# Patient Record
Sex: Female | Born: 1950 | Race: White | Hispanic: No | Smoking: Former smoker
Health system: Southern US, Community
[De-identification: ages and names within clinical notes are randomized; demographics above are authoritative.]

## PROBLEM LIST (undated history)

## (undated) ENCOUNTER — Encounter

## (undated) ENCOUNTER — Encounter: Attending: Anesthesiology | Primary: Anesthesiology

## (undated) ENCOUNTER — Ambulatory Visit: Payer: MEDICARE | Attending: Physician Assistant | Primary: Physician Assistant

## (undated) ENCOUNTER — Telehealth: Payer: MEDICARE | Attending: Physician Assistant | Primary: Physician Assistant

## (undated) ENCOUNTER — Encounter: Attending: Physical Medicine & Rehabilitation | Primary: Physical Medicine & Rehabilitation

## (undated) ENCOUNTER — Encounter: Attending: Family Medicine | Primary: Family Medicine

## (undated) ENCOUNTER — Telehealth: Attending: Mental Health | Primary: Mental Health

## (undated) ENCOUNTER — Ambulatory Visit: Payer: MEDICARE | Attending: Urology | Primary: Urology

## (undated) ENCOUNTER — Encounter: Attending: Physician Assistant | Primary: Physician Assistant

## (undated) ENCOUNTER — Encounter: Attending: Neurology | Primary: Neurology

## (undated) ENCOUNTER — Ambulatory Visit: Payer: MEDICARE

## (undated) ENCOUNTER — Ambulatory Visit
Payer: MEDICARE | Attending: Rehabilitative and Restorative Service Providers" | Primary: Rehabilitative and Restorative Service Providers"

## (undated) ENCOUNTER — Encounter: Attending: Orthopaedic Surgery | Primary: Orthopaedic Surgery

## (undated) ENCOUNTER — Inpatient Hospital Stay

## (undated) ENCOUNTER — Telehealth

## (undated) ENCOUNTER — Ambulatory Visit: Attending: Pharmacist | Primary: Pharmacist

## (undated) ENCOUNTER — Encounter: Attending: Internal Medicine | Primary: Internal Medicine

## (undated) ENCOUNTER — Encounter
Attending: Student in an Organized Health Care Education/Training Program | Primary: Student in an Organized Health Care Education/Training Program

## (undated) ENCOUNTER — Encounter: Payer: MEDICARE | Attending: Psychiatry | Primary: Psychiatry

## (undated) ENCOUNTER — Ambulatory Visit: Payer: MEDICARE | Attending: Anesthesiology | Primary: Anesthesiology

## (undated) ENCOUNTER — Telehealth: Attending: Neurology | Primary: Neurology

## (undated) ENCOUNTER — Encounter: Attending: Rheumatology | Primary: Rheumatology

## (undated) ENCOUNTER — Ambulatory Visit
Payer: MEDICARE | Attending: Student in an Organized Health Care Education/Training Program | Primary: Student in an Organized Health Care Education/Training Program

## (undated) ENCOUNTER — Telehealth
Attending: Pharmacist Clinician (PhC)/ Clinical Pharmacy Specialist | Primary: Pharmacist Clinician (PhC)/ Clinical Pharmacy Specialist

## (undated) ENCOUNTER — Encounter
Attending: Pharmacist Clinician (PhC)/ Clinical Pharmacy Specialist | Primary: Pharmacist Clinician (PhC)/ Clinical Pharmacy Specialist

## (undated) ENCOUNTER — Ambulatory Visit

## (undated) ENCOUNTER — Telehealth: Attending: Physician Assistant | Primary: Physician Assistant

## (undated) ENCOUNTER — Encounter: Attending: Family | Primary: Family

## (undated) ENCOUNTER — Telehealth: Payer: MEDICARE | Attending: Internal Medicine | Primary: Internal Medicine

## (undated) ENCOUNTER — Ambulatory Visit: Payer: MEDICARE | Attending: Neurology | Primary: Neurology

## (undated) ENCOUNTER — Encounter: Attending: Orthopaedic Surgery of the Spine | Primary: Orthopaedic Surgery of the Spine

## (undated) ENCOUNTER — Encounter: Attending: Psychiatry | Primary: Psychiatry

## (undated) ENCOUNTER — Encounter: Attending: Radiation Oncology | Primary: Radiation Oncology

## (undated) ENCOUNTER — Encounter: Attending: Urology | Primary: Urology

## (undated) ENCOUNTER — Ambulatory Visit: Payer: MEDICARE | Attending: Family Medicine | Primary: Family Medicine

## (undated) ENCOUNTER — Ambulatory Visit: Payer: MEDICARE | Attending: Physical Medicine & Rehabilitation | Primary: Physical Medicine & Rehabilitation

## (undated) ENCOUNTER — Ambulatory Visit: Payer: MEDICARE | Attending: Family | Primary: Family

## (undated) ENCOUNTER — Ambulatory Visit
Attending: Pharmacist Clinician (PhC)/ Clinical Pharmacy Specialist | Primary: Pharmacist Clinician (PhC)/ Clinical Pharmacy Specialist

## (undated) ENCOUNTER — Telehealth
Attending: Rehabilitative and Restorative Service Providers" | Primary: Rehabilitative and Restorative Service Providers"

## (undated) ENCOUNTER — Encounter: Attending: Mental Health | Primary: Mental Health

## (undated) ENCOUNTER — Telehealth: Attending: Nurse Practitioner | Primary: Nurse Practitioner

## (undated) ENCOUNTER — Telehealth: Attending: Physical Medicine & Rehabilitation | Primary: Physical Medicine & Rehabilitation

## (undated) ENCOUNTER — Ambulatory Visit: Payer: MEDICARE | Attending: Internal Medicine | Primary: Internal Medicine

## (undated) ENCOUNTER — Inpatient Hospital Stay: Payer: MEDICARE

## (undated) ENCOUNTER — Ambulatory Visit: Payer: MEDICARE | Attending: Otolaryngology | Primary: Otolaryngology

## (undated) ENCOUNTER — Ambulatory Visit: Payer: MEDICARE | Attending: Clinical | Primary: Clinical

## (undated) ENCOUNTER — Telehealth: Attending: Urology | Primary: Urology

## (undated) ENCOUNTER — Telehealth: Attending: Rheumatology | Primary: Rheumatology

## (undated) ENCOUNTER — Telehealth: Payer: MEDICARE | Attending: Family Medicine | Primary: Family Medicine

## (undated) ENCOUNTER — Encounter: Attending: Hematology & Oncology | Primary: Hematology & Oncology

## (undated) ENCOUNTER — Telehealth: Attending: Family Medicine | Primary: Family Medicine

## (undated) ENCOUNTER — Telehealth
Attending: Student in an Organized Health Care Education/Training Program | Primary: Student in an Organized Health Care Education/Training Program

## (undated) ENCOUNTER — Encounter: Attending: Pharmacotherapy | Primary: Pharmacotherapy

## (undated) ENCOUNTER — Ambulatory Visit: Payer: MEDICARE | Attending: Radiation Oncology | Primary: Radiation Oncology

## (undated) ENCOUNTER — Ambulatory Visit: Payer: MEDICARE | Attending: Pharmacotherapy | Primary: Pharmacotherapy

## (undated) ENCOUNTER — Ambulatory Visit: Payer: MEDICARE | Attending: Psychiatry | Primary: Psychiatry

## (undated) ENCOUNTER — Institutional Professional Consult (permissible substitution): Payer: MEDICARE | Attending: Physician Assistant | Primary: Physician Assistant

## (undated) ENCOUNTER — Encounter: Attending: Audiologist | Primary: Audiologist

## (undated) ENCOUNTER — Encounter: Attending: Child & Adolescent Psychiatry | Primary: Child & Adolescent Psychiatry

## (undated) ENCOUNTER — Ambulatory Visit: Attending: Radiation Oncology | Primary: Radiation Oncology

## (undated) ENCOUNTER — Encounter: Attending: Clinical | Primary: Clinical

## (undated) ENCOUNTER — Telehealth: Attending: Internal Medicine | Primary: Internal Medicine

## (undated) DIAGNOSIS — G35 Multiple sclerosis: Secondary | ICD-10-CM

## (undated) HISTORY — PX: OTHER SURGICAL HISTORY: SHX169

## (undated) HISTORY — PX: CERVICAL SPINE SURGERY: SHX589

## (undated) MED ORDER — ACETAMINOPHEN 500 MG TABLET: ORAL | 0.00000 days

---
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
## 2016-11-10 ENCOUNTER — Encounter: Payer: Self-pay | Admitting: Emergency Medicine

## 2016-11-10 ENCOUNTER — Emergency Department
Admission: EM | Admit: 2016-11-10 | Discharge: 2016-11-10 | Disposition: A | Discharge status: Home / Self Care | Payer: Medicare Other | Attending: Emergency Medicine | Admitting: Emergency Medicine

## 2016-11-10 DIAGNOSIS — Z0000 Encounter for general adult medical examination without abnormal findings: Secondary | ICD-10-CM | POA: Insufficient documentation

## 2016-11-10 DIAGNOSIS — F17200 Nicotine dependence, unspecified, uncomplicated: Secondary | ICD-10-CM | POA: Insufficient documentation

## 2016-11-10 DIAGNOSIS — E875 Hyperkalemia: Secondary | ICD-10-CM | POA: Diagnosis present

## 2016-11-10 HISTORY — DX: Multiple sclerosis: G35

## 2016-11-10 LAB — CBC WITH DIFFERENTIAL/PLATELET
BASOS ABS: 0 10*3/uL (ref 0–0.1)
BASOS PCT: 1 %
EOS PCT: 4 %
Eosinophils Absolute: 0.2 10*3/uL (ref 0–0.7)
HEMATOCRIT: 39.4 % (ref 35.0–47.0)
Hemoglobin: 13.3 g/dL (ref 12.0–16.0)
Lymphocytes Relative: 29 %
Lymphs Abs: 1.5 10*3/uL (ref 1.0–3.6)
MCH: 30.9 pg (ref 26.0–34.0)
MCHC: 33.6 g/dL (ref 32.0–36.0)
MCV: 92 fL (ref 80.0–100.0)
MONO ABS: 0.4 10*3/uL (ref 0.2–0.9)
MONOS PCT: 8 %
NEUTROS ABS: 3 10*3/uL (ref 1.4–6.5)
Neutrophils Relative %: 58 %
PLATELETS: 274 10*3/uL (ref 150–440)
RBC: 4.29 MIL/uL (ref 3.80–5.20)
RDW: 13.2 % (ref 11.5–14.5)
WBC: 5 10*3/uL (ref 3.6–11.0)

## 2016-11-10 LAB — BASIC METABOLIC PANEL
Anion gap: 5 (ref 5–15)
BUN: 20 mg/dL (ref 6–20)
CALCIUM: 8.6 mg/dL — AB (ref 8.9–10.3)
CO2: 29 mmol/L (ref 22–32)
Chloride: 102 mmol/L (ref 101–111)
Creatinine, Ser: 0.71 mg/dL (ref 0.44–1.00)
GFR calc Af Amer: >60 mL/min (ref >60)
GLUCOSE: 98 mg/dL (ref 65–99)
Potassium: 4.1 mmol/L (ref 3.5–5.1)
Sodium: 136 mmol/L (ref 135–145)

## 2016-11-10 NOTE — ED Provider Notes (Signed)
[facility] Emergency Department Provider Note ____________________________________________   I have reviewed the triage vital signs and the triage nursing note.  HISTORY  Chief Complaint Hyperkalemia    Historian Patient  HPI Haley Boyd is a 66 y.o. female was sent to ED for evaluation for elevated potassium upon outpatient neurology labs - reportedly 5.1 and 5.7.  She has MS.  Appt with neurologist 1 month ago showed 5.1.  Today was routine follow up and had bmp checked in follow up to prior lab value, she was called at home to go to ER for evaluation.  She is in the middle of a divorce and is moving and feels under stress.  She's felt exhausted and with MS flare of symptoms, but no chest pain or trouble breathing.    Past Medical History:  Diagnosis Date  . Multiple sclerosis (HCC)     There are no active problems to display for this patient.   Past Surgical History:  Procedure Laterality Date  . colon bypass      Prior to Admission medications   Not on File    Allergies  Allergen Reactions  . Codeine     No family history on file.  Social History Social History  Substance Use Topics  . Smoking status: Current Some Day Smoker  . Smokeless tobacco: Never Used  . Alcohol use Yes     Comment: occ    Review of Systems  Constitutional: Negative for fever. Eyes: Negative for visual changes. ENT: Negative for sore throat. Cardiovascular: Negative for chest pain. Respiratory: Negative for shortness of breath. Gastrointestinal: Negative for abdominal pain, vomiting and diarrhea. Genitourinary: Negative for dysuria. Musculoskeletal: Negative for back pain. Skin: Negative for rash. Neurological: Negative for headache. 10 point Review of Systems otherwise negative ____________________________________________   PHYSICAL EXAM:  VITAL SIGNS: ED Triage Vitals  Enc Vitals Group     BP 11/10/16 1643 110/69     Pulse Rate  11/10/16 1643 73     Resp 11/10/16 1643 16     Temp 11/10/16 1643 98.2 F (36.8 C)     Temp Source 11/10/16 1643 Oral     SpO2 11/10/16 1643 96 %     Weight 11/10/16 1639 142 lb (64.4 kg)     Height --      Head Circumference --      Peak Flow --      Pain Score --      Pain Loc --      Pain Edu? --      Excl. in GC? --      Constitutional: Alert and oriented. Well appearing and in no distress. HEENT   Head: Normocephalic and atraumatic.      Eyes: Conjunctivae are normal. PERRL. Normal extraocular movements.      Ears:         Nose: No congestion/rhinnorhea.   Mouth/Throat: Mucous membranes are moist.   Neck: No stridor. Cardiovascular/Chest: Normal rate, regular rhythm.  No murmurs, rubs, or gallops. Respiratory: Normal respiratory effort without tachypnea nor retractions. Breath sounds are clear and equal bilaterally. No wheezes/rales/rhonchi. Gastrointestinal: Soft. No distention, no guarding, no rebound. Nontender.    Genitourinary/rectal:Deferred Musculoskeletal: Nontender with normal range of motion in all extremities. No joint effusions.  No lower extremity tenderness.  No edema. Neurologic:  Normal speech and language. No gross or focal neurologic deficits are appreciated. Skin:  Skin is warm, dry and intact. No rash noted. Psychiatric: Mood and affect are normal.  Speech and behavior are normal. Patient exhibits appropriate insight and judgment.   ____________________________________________  LABS (pertinent positives/negatives)  Labs Reviewed  BASIC METABOLIC PANEL - Abnormal; Notable for the following:       Result Value   Calcium 8.6 (*)    All other components within normal limits  CBC WITH DIFFERENTIAL/PLATELET    ____________________________________________    EKG I, Governor Rooks, MD, the attending physician have personally viewed and interpreted all ECGs.  71 bpm. Normal sinus rhythm. Narrow QRS. Normal axis. Normal ST and T-wave. No  peaked T waves. ____________________________________________  RADIOLOGY All Xrays were viewed by me. Imaging interpreted by Radiologist.  None __________________________________________  PROCEDURES  Procedure(s) performed: None  Critical Care performed: None  ____________________________________________   ED COURSE / ASSESSMENT AND PLAN  Pertinent labs & imaging results that were available during my care of the patient were reviewed by me and considered in my medical decision making (see chart for details).    No clinical complaints.  BMP shows K 4.1 - discussed with patient.  Ok for discharge from ER.    CONSULTATIONS:  None  Patient / Family / Caregiver informed of clinical course, medical decision-making process, and agree with plan.   I discussed return precautions, follow-up instructions, and discharge instructions with patient and/or family.  Discharge instructions:  You were evaluated for outpatient labs concerning for elevated potassium, and your potassium level today is 4.1 (within the normal range).  Return to the emergency department for any chest pain, trouble breathing, dizziness, nausea, vomiting, numbness, numbness, or any other symptoms concerning to you. ___________________________________________   FINAL CLINICAL IMPRESSION(S) / ED DIAGNOSES   Final diagnoses:  Normal physical examination              Note: This dictation was prepared with Dragon dictation. Any transcriptional errors that result from this process are unintentional    Governor Rooks, MD 11/10/16 1750

## 2016-11-10 NOTE — Discharge Instructions (Signed)
You were evaluated for outpatient labs concerning for elevated potassium, and your potassium level today is 4.1 (within the normal range).  Return to the emergency department for any chest pain, trouble breathing, dizziness, nausea, vomiting, numbness, numbness, or any other symptoms concerning to you.

## 2016-11-10 NOTE — ED Triage Notes (Signed)
Pt states that she was seen by her neurologist today, was told that last month her potassium was 5.1, neurologist rechecked her potassium today and told her it was 5.7. Pt does not have a copy of labs. Unable to see labs in our system. Pt states that her neurologist is at [facility].  Pt. States that she feels more tired than normal, other than that pt denies any complaints at this time.

## 2017-02-27 ENCOUNTER — Ambulatory Visit
Admission: RE | Admit: 2017-02-27 | Discharge: 2017-02-27 | Disposition: A | Discharge status: Home / Self Care | Payer: MEDICARE

## 2017-02-27 DIAGNOSIS — M5481 Occipital neuralgia: Principal | ICD-10-CM

## 2017-02-27 MED FILL — COPAXONE/40MG/ML/SOSY: COPAXONE/40MG/ML/SOSY | 28 days supply | Qty: 12 | Fill #4

## 2017-03-02 MED ORDER — METHYLPREDNISOLONE 4 MG TABLETS IN A DOSE PACK
PACK | 0 refills | 0 days | Status: CP
Start: 2017-03-02 — End: 2017-03-21

## 2017-03-03 ENCOUNTER — Inpatient Hospital Stay
Admission: EM | Admit: 2017-03-03 | Discharge: 2017-03-08 | Disposition: A | Discharge status: Skilled Nursing Facility | Payer: MEDICARE | Source: Intra-hospital | Attending: Anesthesiology | Admitting: Anesthesiology

## 2017-03-03 ENCOUNTER — Inpatient Hospital Stay
Admission: EM | Admit: 2017-03-03 | Discharge: 2017-03-08 | Disposition: A | Discharge status: Skilled Nursing Facility | Payer: MEDICARE | Source: Intra-hospital

## 2017-03-03 DIAGNOSIS — M4712 Other spondylosis with myelopathy, cervical region: Principal | ICD-10-CM

## 2017-03-04 DIAGNOSIS — M4712 Other spondylosis with myelopathy, cervical region: Principal | ICD-10-CM

## 2017-03-04 MED ORDER — ONDANSETRON 4 MG DISINTEGRATING TABLET
ORAL_TABLET | Freq: Four times a day (QID) | ORAL | 0 refills | 0 days | Status: CP | PRN
Start: 2017-03-04 — End: 2017-03-11

## 2017-03-04 MED ORDER — OXYCODONE 5 MG TABLET
ORAL_TABLET | ORAL | 0 refills | 0.00000 days | Status: CP | PRN
Start: 2017-03-04 — End: 2017-03-11

## 2017-03-04 MED ORDER — DOCUSATE SODIUM 100 MG CAPSULE
ORAL_CAPSULE | Freq: Two times a day (BID) | ORAL | 0 refills | 0.00000 days | Status: CP
Start: 2017-03-04 — End: 2017-04-03

## 2017-03-04 MED ORDER — ACETAMINOPHEN 325 MG TABLET: 1000 mg | tablet | Freq: Three times a day (TID) | 0 refills | 0 days | Status: AC

## 2017-03-04 MED ORDER — ACETAMINOPHEN 325 MG TABLET
ORAL_TABLET | Freq: Three times a day (TID) | ORAL | 0 refills | 0.00000 days | Status: CP | PRN
Start: 2017-03-04 — End: 2017-04-03

## 2017-03-04 MED ORDER — DOCUSATE SODIUM 100 MG CAPSULE: 100 mg | capsule | Freq: Two times a day (BID) | 0 refills | 0 days | Status: AC

## 2017-03-04 MED ORDER — POLYETHYLENE GLYCOL 3350 17 GRAM ORAL POWDER PACKET
PACK | Freq: Every day | ORAL | 0 refills | 0.00000 days | Status: CP | PRN
Start: 2017-03-04 — End: 2017-03-07

## 2017-03-04 NOTE — Unmapped (Signed)
Discharge Summary    Admit date: 03/03/2017    Discharge date and time: 03/08/17     Discharge to:  Skilled nursing facility    Discharge Service: [facility])    Discharge Attending Physician: Timothy Lasso, MD    Discharge  Diagnoses: cervical myelopathy    Secondary Diagnosis: Active Problems:    * No active hospital problems. *      OR Procedures:    ARTHRODESIS, ANTERIOR INTERBODY TECHNIQUE, INCLUDE MINIMAL DISKECTOMY TO PREP INTERSPACE; CERVICAL BELOW C2  Date  03/04/2017  -------------------     Ancillary Procedures: no procedures    Discharge Day Services:    Subjective   No acute events since surgery. Reports mild pain about operative site, mild throat soreness. No new N/T.  Feels much stronger in UE/LE No chest pain. No SOB. No nausea/vomiting. Some dysphagia but able to swallow, no difficulty breathing.    Objective  No data found.    I/O this shift:  In: 360 [P.O.:360]  Out: 350 [Urine:350]    Physical Exam:  Physical Exam:  ??  General: NAD  CV: extremities WWP  Pulm: NWOB, SORA  Neuro: awake/alert  ??  Neck:  Dressing c/d/i without drainage.  Drain had scant SS drainage; pulled in AM  [place] J collar in place.  ??  Sensation: Intact to light touch in the bilat hands and bilat feet.   ??  Motor R L   Deltoid 5 5   Bicep 5- 5-   Tricep 5- ??  5-   WE 5 5   Grip 5 5   IO 5 5   IP 4 4   Quad 4 4   TA 5 5   EHL 5 5   GS 5 5       Hospital Course: Patient presented to the [facility] ED with 1 week of progressively worsening bilateral upper and lower extremity weakness. Patient was admitted to the orthopedics service for emergent C3-7 ACDF w/C5 corpectomy 7/15. The patient was taken to the OR on 03/04/2017. She tolerated the procedure well, was extubated in the OR, and was taken to the PACU where she received routine postoperative care before being transferred to the floor. She did well postoperatively. Her diet was slowly advanced and at the time of discharge she was tolerating the expected diet. The patient was able to void spontaneously, have her pain controlled with P.O. pain medication, and ambulate with minimal assistance prior to discharge.    Condition at Discharge: Improved  Discharge Medications:      Your Medication List      START taking these medications    acetaminophen 325 MG tablet  Commonly known as:  TYLENOL  Take 3 tablets (975 mg total) by mouth every eight (8) hours as needed.     docusate sodium 100 MG capsule  Commonly known as:  COLACE  Take 1 capsule (100 mg total) by mouth Two (2) times a day.     ondansetron 4 MG disintegrating tablet  Commonly known as:  ZOFRAN-ODT  Take 1 tablet (4 mg total) by mouth every six (6) hours as needed. for up to 7 days     oxyCODONE 5 MG immediate release tablet  Commonly known as:  ROXICODONE  Take 1-2 tablets (5-10 mg total) by mouth every four (4) hours as needed. for up to 7 days        CHANGE how you take these medications    baclofen 20 MG  tablet  Commonly known as:  LIORESAL  Take 1 tablet (20 mg total) by mouth Two (2) times a day. Morning and bedtime.  What changed:  ?? when to take this  ?? additional instructions        CONTINUE taking these medications    amLODIPine 2.5 MG tablet  Commonly known as:  NORVASC  Take 2.5 mg by mouth daily.     buPROPion 150 MG 12 hr tablet  Commonly known as:  WELLBUTRIN SR  Take 1 tablet (150 mg total) by mouth Two (2) times a day.     carBAMazepine 200 MG 12 hr capsule  Commonly known as:  CARBATROL  Take 2 capsules (400 mg total) by mouth Two (2) times a day.     cholecalciferol (vitamin D3) 2,000 unit Cap  Take 2,000 Units by mouth daily.     clonazePAM 1 MG tablet  Commonly known as:  KlonoPIN  Take 1.5 mg by mouth Three (3) times a day as needed for anxiety.     gabapentin 400 MG capsule  Commonly known as:  NEURONTIN  Take 400 mg by mouth Three (3) times a day.     glatiramer 40 mg/mL Syrg  Inject 1 mL (40 mg total) under the skin Three (3) times a week. Separate doses by at least 48 hours. methylPREDNISolone 4 mg tablet  Commonly known as:  MEDROL DOSEPACK  follow package directions     omeprazole 40 MG capsule  Commonly known as:  PriLOSEC  Take 40 mg by mouth daily.        ASK your doctor about these medications    polyethylene glycol 17 gram packet  Commonly known as:  MIRALAX  Take 17 g by mouth daily as needed (constipation). for up to 3 days  Ask about: Should I take this medication?            Pending Test Results:     Discharge Instructions:   Regular diet.       Follow Up instructions and Outpatient Referrals     Ambulatory referral to Home Health       Performing location?:  [facility]    Disciplines requested:   Physical Therapy  Occupational Therapy       Services to provide:   Strengthening Excercises  Evaluate and Treat  Assess and Teach       I certify that Ashlee Christian is confined to his/her home and needs physical therapy and occupational therapy. The patient is under my care, and I have authorized services on this plan of care and will periodically review the plan. The patient had a face-to-face encounter with an allowed provider type on (date) 03/08/17 and the encounter was related to the primary reason for home health care.         Discharge instructions       [facility] DEPARTMENT OF ORTHOPAEDICS - SPINE SURGERY    Dr. Frederik Pear                                DISCHARGE INSTRUCTIONS  Thank you for entrusting your care to [place].  We would like to make your healing process as rapid as possible.     Always CALL us first and we will call you back.    CONTACT NUMBER:  [phone] Marita Kansas, RN    NOTE: after 4 PM, or on weekends or holidays, please contact  the Hospital Operator at [phone] and ask for the Orthopedic Resident on Call. It may be up to 30 minutes before you receive a call back.    FOLLOW UP APPOINTMENT  Please call Central Scheduling Office: [phone] to make your 2 week post-op visit if it has not already been scheduled.     YOU SHOULD CALL IF:  Have more redness and/or swelling or excessive drainage at the incision site.  Increasing pain despite rest and taking pain medicine.  A Fever of 101.5?? F or greater, sweats, or chills 2 days after surgery  New pain in the arms or legs, New weakness or numbness.  Have difficulty with urination or bowel movements, pain or numbness in the rectal, vaginal or scrotal area.    WOUND CARE  You should leave the surgical dressing in place as long as it stays clean and dry. If dressing becomes wet and need to be changed, then place a dry dressing on the incision once or twice a day as needed only. The drainage from incision should be decreasing. If it is not decreasing, let us know. Do NOT put any creams on the incision until cleared by your physician.   Some drainage in the first few days is normal. If it is slightly reddish-brown/yellow, do not be concerned.  It is OKAY to shower with or without any dressing at incision site when you feel comfortable. Pat the incision site dry and replace with clean, dry dressing.     YOU SHOULD CALL 911 IF you have Shortness of Breath or Chest Pain.        ACTIVITY FOLLOWING SURGERY     Cervical Discectomy and Anterior Cervical Fusion  Perform Activity as tolerated  Keep wearing soft collar for comfort and as needed.    You should walk as much as tolerated  No heavy housework (bed making, vacuuming, laundry)  If use of stairs is necessary, go slowly and use handrail  No special exercises necessary-continue exercises shown in hospital  Avoid sitting in chairs and sofas that are low to the ground  Avoid sleeping on a mattress with the height not equal to that of your mid-thigh. This makes transfer in and out of bed easier.  Sexual Activity may be performed as tolerated.   Riding in the car is OK. Do not drive while taking Narcotic pain medications    For any and all questions regarding your after care, please try calling us FIRST and we will call you back.    Please note that narcotic pain medicine will not be refilled over the phone. We only refill pain medications up to 6 weeks postoperatively, and you will need an appointment for reevaluation prior to any refills.             Appointments which have been scheduled for you    Mar 09, 2017 10:15 AM EDT  INJECTION SET 30 with Karma Greaser, MD  [facility] SPINE CTR [facility] PAIN [facility] RD [place] [facility]) [address]  [place] [address]  [phone]   Mar 14, 2017  2:45 PM EDT  NEW GENERAL with Kem Kays, MD  [facility] UROLOGY [address] [facility] HILL [facility]) [address]  [place] [address]  [phone]   Mar 21, 2017 11:30 AM EDT  Danelle Earthly FOLLOW UP with Timothy Lasso, MD  [facility] SPINE CTR NEUROSURG ORTHO [facility] [address] [place] [facility] REGION) [address]  [place] [address]  [phone]   Apr 17, 2017 10:30 AM EDT  RETURN  NEURO-OPHTHAL with Harrietta Guardian, MD  [facility] O[address]THALMOLOGY [address] [place] [facility]) [address]  [address]  [place] [address]  [phone]   Apr 24, 2017 11:15 AM EDT  Danelle Earthly FOLLOW UP with Timothy Lasso, MD  [facility] SPINE CTR [address] [place] [facility] REGION) [address]  [place] [address]  [phone]   Apr 24, 2017  1:00 PM EDT  Ida Rogue with Wynona Canes ROOM 4  [facility] REGION) [address]  [place] [address]  [phone]   Jul 18, 2017 11:00 AM EST  [place] RETURN with Sarita Bottom, MD  [facility] NEUROLOGY CLINIC [address] [place] [address]  [facility]) [address]  [place] [address]  [phone]         Resources and Referrals     Shower chair       Type:  With Back    Length of Need:  99    Walker       Wheels:  5 Fixed    Length of Need:  99

## 2017-03-06 NOTE — Unmapped (Signed)
Physical Medicine and Rehab  Inpatient Consult Note [facility]    Requesting Attending Physician: Timothy Lasso, MD  Service Requesting Consult: Orthopedics [facility])    ASSESSMENT/RECOMMENDATIONS:     Ashlee Christian is a 66 y.o. female with PMHx as noted below seen in consultation for evaluation of rehab needs and recommendations regarding cervical stenosis/myelopathy now s/p ACDF w/ C5 corpectomy.    CONSTIPATION: Patient reports that she has not had a regular bowel movement in days. She said she had a small BM this a.m. but still feels constipated. She reportedly takes 400 mg of docusate at home daily for constipation. While she does report chronic constipation (q3days BM) at home, this could be due to her myelopathy as well.  Consider:  - Recommend increasing Docusate to 100mg  three times daily.   - Recommend starting Senna 2 tablets at noon (lunchtime) to utilize the gastro-colic reflex.  - Recommend low threshold for bisacodyl suppository (given as below)   - If able perform digital stimulation with administration of evening bisacodyl suppository (equivalent of Magic bullet patient uses at home). Performed 5-10 minutes after inserting the suppository:    1) With the patient lying on their left side, gently insert a lubricated finger into the rectum.    2) Gently move the finger in a counterclockwise circular motion keeping the finger in contact with the rectal wall.    3) Continue for 20-60 seconds and no longer than 1 minute.    4) Repeat every 5-10 minutes until a bowel movement is produced.    5) Completion of the bowel program is noted by no further stool after 2 digital stimulations or when mucus is excreted without stool.    ORTHOSTATIC HYPOTENSION:   Patient's with spinal cord injury are at higher risk for developing orthostatic hypotension given the inability to regulate sympathetic input. Orthostatic hypotension can occur due to: postural changes, head-up tilt >60 degrees, dehydration, poor nutritional status, anemia, large meals, caffeine, and alcohol.   The patient's blood pressure has reportedly been labile today with multiple episodes of hypotension. The patient began to feel woozy during our examination (she was sitting up) with BP in the low 90s/50s. We had the patient lay down to about 30?? and her symptoms improved and pressure increased to 112/60s. Multiple factors may be contributing as the patient may have orthostatic hypotension due to SCI as above as well as contributing factors of possible dehydration, Norvasc, and being on an increased dose of Neurontin as the patient reports that she was only on 400 mg twice a day prior to admission and she is now on 3200 mg daily.   - Orthostatic Hypotension Interventions that may be considered include:    - Wearing TEDs and/or abdominal binder when out of bed   - Encouraging smaller meals and limiting caffeine   - We recommend decreasing gabapentin as tolerated as the patient does not report any numbness/tingling or shooting pains at this time and this may be contributing to her hypotension.  - We recommend making sure that the patient states hydrated and encouraging by mouth intake/giving IVF as appropriate.   - We recommend holding Norvasc at this time    DISPO AND FURTHER REHAB:   - Patient has complex rehab, nursing, and medical needs given her recent blood pressure fluctuations. She may be appropriate for Acute Inpatient Rehabilitation depending on her further work with therapy. At this time it appears that she is likely too functional for AIR as the patient is  walking 100 feet with a rolling walker and standby assist, and per OT notes is supervision->independent with almost all ADL's.   - Please continue to have patient work with PT, OT, and SLP to maximize functional status with mobility and ADLs as well as for cognitive and swallow function.    [facility]  [facility]   [address] [place], [place] [address]   Phone: [phone]   Fax: [phone]    Please page Delane Ginger, DO from 8AM-5PM weekdays or the first call pager after hours/weekends/holidays with questions.    Thank you for this consult.    SUBJECTIVE:     Reason for Consult: Patient seen in consultation at the request of Timothy Lasso, MD for evaluation of rehabilitation needs and recommendations.    History of Present Illness: Ashlee Christian is a 66 y.o. female with PMHx as noted below seen in consultation regarding cervical stenosis/myelopathy (MRI with significant compression from C4-7) now s/p ACDF w/ C5 corpectomy. The patient has had a long history of cervical stenosis and is followed with Dr. Cameron Ali as an outpatient. The patient was planning on a cervical fusion in September but in early July, 2018 that is progressively worsening bilateral upper and lower extremity weakness. The patient was put on a Medrol Dosepak but failed to improve and went to [facility] ED. She is admitted and underwent emergent ACDF w/ C5 corpectomy on 03/04/17.    Prior to hospitalization the patient was living alone in a 1 level house with 4 stairs to enter. She had a son living near by and a daughter who is planning to live with her for 10 days after discharge to help her out.    Today the patient reports that she has had issues with her blood pressure. She states that when she had a line pulled this morning her pressure dropped to 40s over 20s. She has had IV fluids running since then and her pressure went up to 163/88. She states that she has gotten dizzy during these episodes of blood pressure fluctuation. She states that she has been getting stronger since her surgery and has been able to walk with a walker around the floor.    Pre-Morbid Functional Status: Current Functional Status:   Activities of daily living: independent  Mobility: The patient had a cane which she used for longer walks but walked independently around the house.   Assistive devices: The patient had a cane which she used for longer walks but walked independently around the house.     Activities of daily living:   Eating: independent  Bathing: set up & supervision in sitting, CGA if standing to wash perineal/buttocks  Recommend toileting with CGA  Toilet transfer with CGA  Mobility:   - Logroll with SBA  - sit to stand SBA, RW, 100' SBA RW.  Assistive devices: Rolling Walker        Precautions:  Safety Interventions  Safety Interventions: fall reduction program maintained, lighting adjusted for tasks/safety, nonskid shoes/slippers when out of bed, supervised activity   C-spine precautions    Medical / Surgical History:   Past Medical History:   Diagnosis Date   ??? Hypertension    ??? MS (multiple sclerosis) (CMS-HCC)    ??? Osteoporosis      Past Surgical History:   Procedure Laterality Date   ??? CATARACT EXTRACTION Bilateral 2016    [place]   ??? COLECTOMY     ???  EYE SURGERY Left 2012    Laser sx due to detached retina    ??? PR ALLOGRAFT FOR SPINE SURGERY ONLY MORSELIZED Midline 03/04/2017    Procedure: Allograft For Spine Surgery Only; Morselized;  Surgeon: Timothy Lasso, MD;  Location: MAIN OR [facility];  Service: Ortho Spine   ??? PR ANTERIOR INSTRUMENTATION 4-7 VERTEBRAL SEGMENTS Midline 03/04/2017    Procedure: Anterior Instrumentation; 4 To 7 Vertebral Segments;  Surgeon: Timothy Lasso, MD;  Location: MAIN OR [facility];  Service: Ortho Spine   ??? PR ARTHRODESIS ANT INTERBODY INC DISCECTOMY, CERVICAL BELOW C2 Midline 03/04/2017    Procedure: Arthrodes, Ant Intrbdy, Incl Disc Spc Prep, Discect, Osteophyt/Decompress Spinl Crd &/Or Nrv Rt, Crv Blo C2;  Surgeon: Timothy Lasso, MD;  Location: MAIN OR [facility];  Service: Ortho Spine   ??? PR ARTHRODESIS ANT INTERBODY MIN DISCECTOMY, CERVICAL BELOW C2  03/04/2017    Procedure: Arthrodesis, Anterior Interbody Technique, Include Minimal Diskectomy To Prep Interspace; Cervical Below C2;  Surgeon: Timothy Lasso, MD;  Location: MAIN OR [facility];  Service: Ortho Spine   ??? PR ARTHRODESIS ANT INTERBODY MIN DISCECTOMY,EA ADDL  03/04/2017    Procedure: Arthrodesis, Anterior Interbody Technique, W/Minimal Diskectomy To Prep Interspace; Each Add`L Interspace;  Surgeon: Timothy Lasso, MD;  Location: MAIN OR [facility];  Service: Ortho Spine   ??? PR AUTOGRAFT SPINE SURGERY LOCAL FROM SAME INCISION Midline 03/04/2017    Procedure: Autograft/Spine Surg Only (W/Harvest Graft); Local (Eg, Rib/Spinous Proc, Ples Specter) Obtain From Same Incis;  Surgeon: Timothy Lasso, MD;  Location: [facility]IN OR [facility];  Service: Ortho Spine   ??? PR INSJ BIOMCHN DEV INTERVERTEBRAL DSC SPC W/ARTHRD Midline 03/04/2017    Procedure: Insert Interbody Biomechanical Device(S) With Integral Anterior Instrument For Device Anchoring, When Performed, To Intervertebral Disc Space In Conjunction With Interbody Arthrodesis, Each Interspace;  Surgeon: Timothy Lasso, MD;  Location: MAIN OR [facility];  Service: Ortho Spine   ??? PR INSJ BIOMCHN DEV VRT CORPECTOMY DEFECT W/ARTHRD Midline 03/04/2017    Procedure: Insert Intervertebral Biomechanical Device(S) W Integral Anterior Instrument For Anchoring, When Performed, To Vert Corpectomy(Ies) Defect, In Conjunction W Interbody Arthrodesis, Each Contig Defect;  Surgeon: Timothy Lasso, MD;  Location: MAIN OR [facility];  Service: Ortho Spine   ??? PR REMV VERT BODY,CERV,ONE SGMT Midline 03/04/2017    Procedure: Vertebral Corpectomy-Ant W/Decomp; Cerv 1 Segmt;  Surgeon: Timothy Lasso, MD;  Location: MAIN OR [facility];  Service: Ortho Spine   ??? TONSILLECTOMY        Social History:   Social History   Substance Use Topics   ??? Smoking status: Never Smoker   ??? Smokeless tobacco: Never Used   ??? Alcohol use No     Home Environment: patient lives alone in a 1 level house, 4 steps at entry    Family History:   family history includes Hypertension in her mother and sister; No Known Problems in her brother, maternal aunt, maternal grandfather, maternal uncle, paternal aunt, paternal grandfather, paternal grandmother, and paternal uncle; Stroke in her father and maternal grandmother.    Allergies:   Amantadine and Codeine    Medications:   Scheduled   amLODIPine (NORVASC) tablet 2.5 mg Daily   baclofen (LIORESAL) tablet 20 mg TID   buPROPion (WELLBUTRIN SR) 12 hr tablet 150 mg BID   carBAMazepine (CARBATROL) 12 hr capsule 400 mg BID   cholecalciferol (vitamin D3) tablet 4,000 Units Daily   docusate sodium (COLACE) capsule 100 mg BID   gabapentin (NEURONTIN)  capsule 800 mg 4x Daily     PRN   acetaminophen 650 mg Q4H PRN   calcium carbonate 400 mg of elem calcium Daily PRN   clonazePAM 1.5 mg BID PRN   MORPhine injection 2 mg Q2H PRN   Or     MORPhine injection 4 mg Q2H PRN   omeprazole 40 mg Daily PRN   ondansetron 4 mg Q6H PRN   oxyCODONE 5 mg Q4H PRN   Or     oxyCODONE 10 mg Q4H PRN   polyethylene glycol 17 g Daily PRN     Continuous Infusions   sodium chloride Last Rate: 75 mL/hr (03/03/17 2244)       Review of Systems:    Full 10 systems reviewed and negative, other than as noted in the HPI.    OBJECTIVE:     Vitals:  Temp:  [35.8 ??C-36.2 ??C] 36.2 ??C  Heart Rate:  [72-76] 72  Resp:  [16-19] 16  BP: (124-148)/(76-81) 142/80  MAP (mmHg):  [89-96] 89  SpO2:  [94 %-96 %] 95 %    Physical Exam:  GEN: NAD sitting in bed  EYES: EOMI  HENT: Hearing adequate for conversation  NECK: supple  RESP: NWOB   CV: warm extremities  GI: non-distended   SKIN: no visible masses, lesions, rashes, ecchymoses, or lacerations  MSK: Moving b/l UEs at least antigravity. 2/5 HF bilaterally. Moves LEs at least antigravity otherwise  NEURO: A&Ox3,speech fluid and coherent, follows commands well and answers questions appropriately    LATER IN EXAM:  Soon after we started the exam the patient stated that she was feeling woozy. Vitals were checked and were 90s over 50s, pulse in the 70s (not responding to hypotension), pulse ox 92%. We laid the patient down and she started to feel a little bit better. Her pressure came back to the 112 over 60s and she felt a little better after this. The patient did not feel up to full manual muscle testing. The patient's nurse was made aware of the incident.    Labs and Diagnostic Studies: Reviewed    CBC -   Recent Labs  Lab Units 03/04/17  0343 03/03/17  1205   WBC 10*9/L 6.8 9.4   RBC 10*12/L 4.14 4.32   HEMOGLOBIN g/dL 60.4 54.0   HEMATOCRIT % 38.6 40.8   MCV fL 93.2 94.4   MCH pg 31.6 31.9   MCHC g/dL 98.1 19.1   RDW % 47.8 13.1   PLATELET COUNT (1) 10*9/L 336 294   MPV fL 6.3* 5.6*     BMP -   Recent Labs  Lab Units 03/04/17  1241 03/04/17  0343 03/03/17  1205   SODIUM WHOLE BLOOD mmol/L 130*  --   --    SODIUM mmol/L  --  131* 134*   POTASSIUM WHOLE BLOOD mmol/L 3.9  --   --    POTASSIUM mmol/L  --  4.4 5.5*   CHLORIDE mmol/L  --  98 97*   CO2 mmol/L  --  24.0 26.0   BUN mg/dL  --  14 10   CREATININE mg/dL  --  2.95* 6.21*   GLUCOSE mg/dL  --  308 657*       Radiology Results: Personally reviewed imaging. Radiology impression below.    03/03/17 MRI C-Spine:  - Moderate cervical spondylosis, mildly progressed from prior study; greatest at C4-C5 and C5-C6 where again moderate spinal canal stenosis or severe neuroforaminal stenosis is seen.    Reuel Boom  Nechama Guard, DO  [facility]    I saw and evaluated the patient, participating in the key portions of the service.  I reviewed the resident???s note.  I agree with the resident???s findings and plan.     Tobie Lords, MD, MPT  Assistant Professor  Physical Medicine and Rehabilitation  03/06/2017  3:40 PM

## 2017-03-06 NOTE — Unmapped (Signed)
Orthopaedic Surgery Post-Op Note  Primary Team: Orthopedics ([facility])    Procedure: Midline - Vertebral Corpectomy-Ant W/Decomp; Cerv 1 Segmt  Midline - Arthrodes, Ant Intrbdy, Incl Disc Spc Prep, Discect, Osteophyt/Decompress Spinl Crd &/Or Nrv Rt, Crv Blo C2  Midline - Anterior Instrumentation; 4 To 7 Vertebral Segments  Midline - Insert Interbody Biomechanical Device(S) With Integral Anterior Instrument For Device Anchoring, When Performed, To Intervertebral Disc Space In Conjunction With Interbody Arthrodesis, Each Interspace  Midline - Insert Intervertebral Biomechanical Device(S) W Integral Anterior Instrument For Anchoring, When Performed, To Vert Corpectomy(Ies) Defect, In Conjunction W Interbody Arthrodesis, Each Contig Defect  Midline - Allograft For Spine Surgery Only; Morselized  Midline - Autograft/Spine Surg Only (W/Harvest Graft); Local (Eg, Rib/Spinous Proc, Ples Specter) Obtain From Same Incis  Arthrodesis, Anterior Interbody Technique, Include Minimal Diskectomy To Prep Interspace; Cervical Below C2  Arthrodesis, Anterior Interbody Technique, W/Minimal Diskectomy To Prep Interspace; Each Add`L Interspace  Date: 03/04/2017    Assessment/Plan:  66 y.o. female with h/o MS and cervical myelopathy w/ worsening UE weakness s/p emergent C3-7 ACDF w/ C5 corpectomy 7/15, doing well postoperatively.    - Pain control via multimodal regimen, encourage prn with gradual transition to po  - Antibiotics: ancef x23h postoperatively  - Decadron 10 Q6H x24h post-op  - Weightbearing status: WBAT, activity as tolerated  - Dressing: Keep clean, dry, and intact.   - Drain pulled 7/17; <10cc of serosanginous output; dressing changed to 4x4 with medipore tape  - HOB elevated POD1  - PT/OT.  - Upright XRs after mobilizing with PT  - Diet: Regular  - Labs: none  - Foley: no Foley  - VTE prophylaxis plan/recommendation: Mechanical/SCDs    - Plan for day: pain control and PT mobilization  - EDD: 7/17-18  - Discharge plan: Home  - Barriers to discharge: pain control, clearance by PT/OT  - Follow-up: 10-14d post-operatively    -----------------------------------------------------------------------------------------------  - Current orthopaedic contact resident: [place]  - Current orthopaedic care attending: Dr. Cameron Ali  - On nights (6pm-6am), weekends, and holidays, please page Orthopaedic Consult pager.    Subjective:  No acute events since surgery. Reports mild pain about operative site, mild throat soreness. No new N/T.  Feels much stronger in UE/LE No chest pain. No SOB. No nausea/vomiting. Some dysphagia but able to swallow, no difficulty breathing.    Objective:   BP 124/80  - Pulse 76  - Temp 36.2 ??C (Oral)  - Resp 16  - Ht 167.6 cm (5' 6)  - Wt 65.3 kg (144 lb)  - SpO2 94%  - BMI 23.24 kg/m??     Physical Exam:    General: NAD  CV: extremities WWP  Pulm: NWOB, SORA  Neuro: awake/alert    Neck:  Dressing c/d/i without drainage.  Drain had scant SS drainage; pulled in AM  [place] J collar in place.    Sensation: Intact to light touch in the bilat hands and bilat feet.   ??  Motor R L   Deltoid 5 5   Bicep 4+ 4+   Tricep 4+ 4+   WE 5 5   Grip 5 5   IO 5 5   IP 4 4   Quad 4 4   TA 4+ 4+   EHL 4+ 4+   GS 5 5       Laboratory Data:   Lab Results   Component Value Date    WBC 6.8 03/04/2017    RBC 4.14 03/04/2017  HGB 13.1 03/04/2017    HCT 38.6 03/04/2017    MCV 93.2 03/04/2017    MCH 31.6 03/04/2017    MCHC 33.9 03/04/2017    RDW 12.7 03/04/2017    PLT 336 03/04/2017       Lab Results   Component Value Date    NA 130 (L) 03/04/2017    K 3.9 03/04/2017    CL 98 03/04/2017    CO2 24.0 03/04/2017    BUN 14 03/04/2017    CREATININE 0.55 (L) 03/04/2017    GLU 115 03/04/2017    CALCIUM 9.1 03/04/2017    ALBUMIN 4.3 10/13/2016       Imaging:  None

## 2017-03-06 NOTE — Unmapped (Signed)
PHYSICAL THERAPY  Evaluation (03/06/17 0929)     Patient Name:  Ashlee Christian       Medical Record Number: [id number]   Date of Birth: 1951/05/21  Sex: Female            Treatment Diagnosis: s/p emergent C3-7 ACDF w/C5 corpectomy    ASSESSMENT    Ashlee Christian is a 66 yo F presenting to PT s/p above procedures. Tolerated ambulation in the hallway with RW today, too fatigued to do the stairs. Anticipate quick progress with 1-2 more visits. Recs are 3x. Moderate complexity eval today 2/2 PMH, acute hospital problems and social factors.    Today's Interventions: AMPAC 18/24, reviewed role of PT, POC, post op precautions, transfer techniques, cues during transfers for safe hand placement and positioning    Activity Tolerance: Patient limited by fatigue;Patient tolerated treatment well (Pt comfortable and in NAD following PT, left sitting up in bedside chair with needs within reach.)    PLAN  Planned Frequency of Treatment:  1-2x per day for: 4-5x week      Planned Interventions: Balance activities;Diaphragmatic / Pursed-lip breathing;Education - Patient;Education - Family / caregiver;Endurance activities;Functional mobility;Gait training;Home exercise program;Neuromuscular re-education;Postural re-education;Self-care / Home training;Stair training;Therapeutic exercise;Therapeutic activity;Transfer training    Post-Discharge Physical Therapy Recommendations:  3x weekly        PT DME Recommendations: Walker (rolling)     Prognosis:  Good  Barriers to Discharge: Endurance deficits  Positive Indicators: PLOF    SUBJECTIVE  Patient reports: RN cleared pt for PT, pt agreeable to PT.  Current Functional Status: Pt received reclined in bed, alert and in NAD.     Prior functional status: Mod I with SPC, has had falls in the past.  Equipment available at home: [place] Mor    Past Medical History:   Diagnosis Date   ??? Hypertension    ??? MS (multiple sclerosis) (CMS-HCC)    ??? Osteoporosis     Social History   Substance Use Topics ??? Smoking status: Never Smoker   ??? Smokeless tobacco: Never Used   ??? Alcohol use No      Past Surgical History:   Procedure Laterality Date   ??? CATARACT EXTRACTION Bilateral 2016    [place]   ??? COLECTOMY     ??? EYE SURGERY Left 2012    Laser sx due to detached retina    ??? PR ALLOGRAFT FOR SPINE SURGERY ONLY MORSELIZED Midline 03/04/2017    Procedure: Allograft For Spine Surgery Only; Morselized;  Surgeon: Timothy Lasso, MD;  Location: MAIN OR [facility];  Service: Ortho Spine   ??? PR ANTERIOR INSTRUMENTATION 4-7 VERTEBRAL SEGMENTS Midline 03/04/2017    Procedure: Anterior Instrumentation; 4 To 7 Vertebral Segments;  Surgeon: Timothy Lasso, MD;  Location: MAIN OR [facility];  Service: Ortho Spine   ??? PR ARTHRODESIS ANT INTERBODY INC DISCECTOMY, CERVICAL BELOW C2 Midline 03/04/2017    Procedure: Arthrodes, Ant Intrbdy, Incl Disc Spc Prep, Discect, Osteophyt/Decompress Spinl Crd &/Or Nrv Rt, Crv Blo C2;  Surgeon: Timothy Lasso, MD;  Location: MAIN OR [facility];  Service: Ortho Spine   ??? PR ARTHRODESIS ANT INTERBODY MIN DISCECTOMY, CERVICAL BELOW C2  03/04/2017    Procedure: Arthrodesis, Anterior Interbody Technique, Include Minimal Diskectomy To Prep Interspace; Cervical Below C2;  Surgeon: Timothy Lasso, MD;  Location: MAIN OR [facility];  Service: Ortho Spine   ??? PR ARTHRODESIS ANT INTERBODY MIN DISCECTOMY,EA ADDL  03/04/2017    Procedure: Arthrodesis, Anterior Interbody Technique, Theodis Blaze Diskectomy  To Prep Interspace; Each Add`L Interspace;  Surgeon: Timothy Lasso, MD;  Location: MAIN OR [facility];  Service: Ortho Spine   ??? PR AUTOGRAFT SPINE SURGERY LOCAL FROM SAME INCISION Midline 03/04/2017    Procedure: Autograft/Spine Surg Only (W/Harvest Graft); Local (Eg, Rib/Spinous Proc, Ples Specter) Obtain From Same Incis;  Surgeon: Timothy Lasso, MD;  Location: MAIN OR [facility];  Service: Ortho Spine   ??? PR INSJ BIOMCHN DEV INTERVERTEBRAL DSC SPC W/ARTHRD Midline 03/04/2017    Procedure: Insert Interbody Biomechanical Device(S) With Integral Anterior Instrument For Device Anchoring, When Performed, To Intervertebral Disc Space In Conjunction With Interbody Arthrodesis, Each Interspace;  Surgeon: Timothy Lasso, MD;  Location: MAIN OR [facility];  Service: Ortho Spine   ??? PR INSJ BIOMCHN DEV VRT CORPECTOMY DEFECT W/ARTHRD Midline 03/04/2017    Procedure: Insert Intervertebral Biomechanical Device(S) W Integral Anterior Instrument For Anchoring, When Performed, To Vert Corpectomy(Ies) Defect, In Conjunction W Interbody Arthrodesis, Each Contig Defect;  Surgeon: Timothy Lasso, MD;  Location: MAIN OR [facility];  Service: Ortho Spine   ??? PR REMV VERT BODY,CERV,ONE SGMT Midline 03/04/2017    Procedure: Vertebral Corpectomy-Ant W/Decomp; Cerv 1 Segmt;  Surgeon: Timothy Lasso, MD;  Location: MAIN OR [facility];  Service: Ortho Spine   ??? TONSILLECTOMY      Family History   Problem Relation Age of Onset   ??? Hypertension Mother    ??? Stroke Father    ??? Hypertension Sister    ??? No Known Problems Brother    ??? No Known Problems Maternal Aunt    ??? No Known Problems Maternal Uncle    ??? No Known Problems Paternal Aunt    ??? No Known Problems Paternal Uncle    ??? Stroke Maternal Grandmother    ??? No Known Problems Maternal Grandfather    ??? No Known Problems Paternal Grandmother    ??? No Known Problems Paternal Grandfather    ??? Amblyopia Neg Hx    ??? Blindness Neg Hx    ??? Cancer Neg Hx    ??? Cataracts Neg Hx    ??? Diabetes Neg Hx    ??? Glaucoma Neg Hx    ??? Macular degeneration Neg Hx    ??? Retinal detachment Neg Hx    ??? Strabismus Neg Hx    ??? Thyroid disease Neg Hx         Allergies: Amantadine and Codeine                Objective Findings              Precautions: Falls (post op c-spine)              Weight Bearing Status: Non- applicable              Required Braces or Orthoses: [place] J    Communication Preference: Verbal  Pain Comments: Mild throat soreness, no other complaints  Medical Tests / Procedures: Imaging, labs reviewed. OR 7/15: 1. C5 corpectomy  2. Anterior cervical fusion of C4-5 and C5-6  3. Anterior cervical decompression and fusion of C6-7   4. Anterior cervical instrumentation from C4-C7   5. Application of interbody cage to corpectomy defect at C5  6. Application of interbody cage to disc space at C6-7   7. Application of allograft bone  8. Application of autograft bone     Equipment / Environment: Vascular access (PIV, TLC, Port-a-cath, PICC)  Living environment: House  Lives With: Alone (son lives nearby)  Home Living: One level home;Stairs to enter with rails        Number of Stairs: 4    Cognition: AO x 4     Skin Inspection: visible skin intact       UE Strength: WFL within precautions     LE Strength: WFL within precautions                                    Bed Mobility: logroll supine to sit with SBA, from elevated HOB  Transfers: sit to/from standing with SBA, RW   Gait: 1 x 100' SBA, RW   Stairs: Pt too fatigued to try the stairs today           Eval Duration(PT): 25 Min.    Medical Staff Made Aware: RN aware  PT G-Codes  Functional Assessment Tool Used: AMPAC  Score: 18/24  Functional Limitation: Mobility: Walking and moving around  Mobility: Walking and Moving Around Current Status (Z6109): At least 40 percent but less than 60 percent impaired, limited or restricted  Mobility: Walking and Moving Around Goal Status [phone]): At least 1 percent but less than 20 percent impaired, limited or restricted  I attest that I have reviewed the above information.  Signed: Starr Lake, PT  Filed 03/06/2017

## 2017-03-06 NOTE — Unmapped (Signed)
OCCUPATIONAL THERAPY  Evaluation (03/06/17 1101)    Patient Name:  Ashlee Christian       Medical Record Number: [id number]   Date of Birth: [birth year]  Sex: Female          OT Treatment Diagnosis:  difficulty with ADLs & functional mobility/transfers s/p emergent C3-7 ACDF w/ C5 corpectomy 7/15    Assessment  Pt is a 66 y.o. female with h/o MS and cervical myelopathy w/ worsening UE weakness s/p emergent C3-7 ACDF w/ C5 corpectomy 7/15, doing well postoperatively.  Pt presents with new post-op c-spine precautions, symptomatic hypotension, impaired balance and decreased activity tolerance impacting her independence & safety completing ADLs & functional mobility/transfers.  Pt will benefit from skilled OT to maximize her functional indepenence, safety and decrease her burden of care.  Based on the daily activity AM-PAC raw score of 19/24, the pt is considered to be 42.80% impaired with self care. This indicates pt is appropriate for 3x weekly at d/c IF pt's daughter able to stay with her at d/c otherwise recommend 5x weekly at d/c as pt lives alone and currently requires CGA-min assist for dynamic standing balance during ADLs & functional mobility/transfers.  Review of pt's occupational profile, client history, assessment of occupational performance, clinical decision making and development of POC required moderate complexity OT evaluation.       Activity Tolerance During Today's Session  Patient tolerated treatment well    Plan  Planned Frequency of Treatment:  1x per day for: 4-5x week    Planned Interventions:  Adaptive equipment;ADL retraining;Balance activities;Bed mobility;Compensatory tech. training;Conservation;Education - Patient;Education - Family / caregiver;Endurance activities;Functional mobility;Home exercise program;Postular / Proximal stability;Positioning;Safety education;Therapeutic exercise;Transfer training;UE Strength / coordination exercise    Post-Discharge Occupational Therapy Recommendations:  OT Post Acute Discharge Recommendations: 3x weekly (if daughter able to stay with pt)   OT DME Recommendations: Tub seat with back    GOALS:   Patient and Family Goals: to return home and PLOF    Long Term Goal #1: pt will return to PLOF in 8 weeks.     Short Term:  Pt will complete toilet transfer mod I using LRAD.   Time Frame : 1 week  Pt will complete toileting mod I using LRAD.   Time Frame : 1 week  Pt will complete LB dressing mod I using LRAD.   Time Frame : 1 week  Caregiver will doff/don [place] J collar independent within context of ADLs.   Time Frame : 1 week    Prognosis:  Excellent  Positive Indicators:  PLOF, supportive family  Barriers to Discharge: Inability to safely perform ADLS;Impaired Balance (hypotension)    Subjective  Current Status Pt received sitting up in recliner; left in supine, all needs within reach, RN Lorna notified  Prior Functional Status mod I-independent with ADLs, IADLs and functional mobility/transfers using cane    Medical Tests / Procedures: emergent C3-7 ACDF w/ C5 corpectomy 7/15  Patient / Caregiver reports: My daughter said she could take family medical leave and stay with me.    Past Medical History:   Diagnosis Date   ??? Hypertension    ??? MS (multiple sclerosis) (CMS-HCC)    ??? Osteoporosis     Social History   Substance Use Topics   ??? Smoking status: Never Smoker   ??? Smokeless tobacco: Never Used   ??? Alcohol use No      Past Surgical History:   Procedure Laterality Date   ??? CATARACT EXTRACTION Bilateral 2016  [place]   ??? COLECTOMY     ??? EYE SURGERY Left 2012    Laser sx due to detached retina    ??? PR ALLOGRAFT FOR SPINE SURGERY ONLY MORSELIZED Midline 03/04/2017    Procedure: Allograft For Spine Surgery Only; Morselized;  Surgeon: Timothy Lasso, MD;  Location: MAIN OR [facility];  Service: Ortho Spine   ??? PR ANTERIOR INSTRUMENTATION 4-7 VERTEBRAL SEGMENTS Midline 03/04/2017    Procedure: Anterior Instrumentation; 4 To 7 Vertebral Segments; Surgeon: Timothy Lasso, MD;  Location: MAIN OR [facility];  Service: Ortho Spine   ??? PR ARTHRODESIS ANT INTERBODY INC DISCECTOMY, CERVICAL BELOW C2 Midline 03/04/2017    Procedure: Arthrodes, Ant Intrbdy, Incl Disc Spc Prep, Discect, Osteophyt/Decompress Spinl Crd &/Or Nrv Rt, Crv Blo C2;  Surgeon: Timothy Lasso, MD;  Location: MAIN OR [facility];  Service: Ortho Spine   ??? PR ARTHRODESIS ANT INTERBODY MIN DISCECTOMY, CERVICAL BELOW C2  03/04/2017    Procedure: Arthrodesis, Anterior Interbody Technique, Include Minimal Diskectomy To Prep Interspace; Cervical Below C2;  Surgeon: Timothy Lasso, MD;  Location: MA[facility] OR [facility];  Service: Ortho Spine   ??? PR ARTHRODESIS ANT INTERBODY MIN DISCECTOMY,EA ADDL  03/04/2017    Procedure: Arthrodesis, Anterior Interbody Technique, W/Minimal Diskectomy To Prep Interspace; Each Add`L Interspace;  Surgeon: Timothy Lasso, MD;  Location: MAIN OR [facility];  Service: Ortho Spine   ??? PR AUTOGRAFT SPINE SURGERY LOCAL FROM SAME INCISION Midline 03/04/2017    Procedure: Autograft/Spine Surg Only (W/Harvest Graft); Local (Eg, Rib/Spinous Proc, Ples Specter) Obtain From Same Incis;  Surgeon: Timothy Lasso, MD;  Location: MAIN OR [facility];  Service: Ortho Spine   ??? PR INSJ BIOMCHN DEV INTERVERTEBRAL DSC SPC W/ARTHRD Midline 03/04/2017    Procedure: Insert Interbody Biomechanical Device(S) With Integral Anterior Instrument For Device Anchoring, When Performed, To Intervertebral Disc Space In Conjunction With Interbody Arthrodesis, Each Interspace;  Surgeon: Timothy Lasso, MD;  Location: MAIN OR [facility];  Service: Ortho Spine   ??? PR INSJ BIOMCHN DEV VRT CORPECTOMY DEFECT W/ARTHRD Midline 03/04/2017    Procedure: Insert Intervertebral Biomechanical Device(S) W Integral Anterior Instrument For Anchoring, When Performed, To Vert Corpectomy(Ies) Defect, In Conjunction W Interbody Arthrodesis, Each Contig Defect;  Surgeon: Timothy Lasso, MD;  Location: MAIN OR [facility];  Service: Ortho Spine   ??? PR REMV VERT BODY,CERV,ONE SGMT Midline 03/04/2017    Procedure: Vertebral Corpectomy-Ant W/Decomp; Cerv 1 Segmt;  Surgeon: Timothy Lasso, MD;  Location: MAIN OR [facility];  Service: Ortho Spine   ??? TONSILLECTOMY      Family History   Problem Relation Age of Onset   ??? Hypertension Mother    ??? Stroke Father    ??? Hypertension Sister    ??? No Known Problems Brother    ??? No Known Problems Maternal Aunt    ??? No Known Problems Maternal Uncle    ??? No Known Problems Paternal Aunt    ??? No Known Problems Paternal Uncle    ??? Stroke Maternal Grandmother    ??? No Known Problems Maternal Grandfather    ??? No Known Problems Paternal Grandmother    ??? No Known Problems Paternal Grandfather    ??? Amblyopia Neg Hx    ??? Blindness Neg Hx    ??? Cancer Neg Hx    ??? Cataracts Neg Hx    ??? Diabetes Neg Hx    ??? Glaucoma Neg Hx    ??? Macular degeneration Neg Hx    ??? Retinal detachment  Neg Hx    ??? Strabismus Neg Hx    ??? Thyroid disease Neg Hx         Amantadine and Codeine     Objective Findings  Precautions / Restrictions  Falls precautions (post-op c-spine precautions)    Weight Bearing  Non-applicable    Required Braces or Orthoses  [place] J;C-collar    Communication Preference  Verbal    Pain  no c/o pain    Equipment / Environment  Vascular access (PIV, TLC, Port-a-cath, PICC)    Living Situation   Living environment: House   Lives With: Alone (reports her son lives nearby and daughter (from [place]) may be able to take FMLA and stay with her at d/c)   Home Living: One level home;Stairs to enter with rails;Tub/shower unit;Standard height toilet   Equipment available at home: [employer] placement (outside): Rail on right side    Cognition   Orientation Level:  Oriented x 4   Arousal/Alertness:  Appropriate responses to stimuli   Attention Span:  Appears intact   Memory:  Appears intact   Following Commands:  Follows all commands and directions without difficulty   Safety Judgment:  Good awareness of safety precautions   Awareness of Errors: Good awareness of safety precautions   Problem Solving:  Able to problem solve independently    Vision / Perception  Vision: Wears glasses all the time  Perception: WFL    Hand Function  Hand Dominance: right  B grasp/release, opposition, RAM=WFL    Skin Inspection  post op neck dressings c/d/i; all lines/leads intact pre & post session    ROM / Strength/Coordination  UE ROM/ Strength/ Coordination: BUE AROM grossly WFL w/in post op c-spine precautions; strength grossly 4/5  LE ROM/ Strength/ Coordination: grossly WFL    Sensation:  BUE SILT; pt denies numbness/tingling    Balance:  static/dynamic sitting=supervision 2/2 reports of dizziness; static/dynamic standing=CGA-min assist 2/2 reports of dizziness, passing out earlier today during post-op dressing change    Mobility/Gait/Transfers: sit<>stand, functional mobility from recliner<>bathroom and toilet transfer=CGA w/o device; standing tolerance=~3 minutes; After ADLs & toilet transfer pt reported not feeling well and requesting to return to supine.  recliner>bed via stand step=hand held assist 2/2 reports of feeling dizziness and not well.  Pt reports feeling better once in supine.  See vitals.  RN Minna Antis notified    ADL:  Bathing: set up & supervision in sitting, CGA if standing to wash perineal/buttocks-simulated task  Grooming: set up in sitting  Dressing: UB=set up in standing; LB=CGA for dynamic standing balance when simulating pulling pants up/down.  Pt able to doff/don B socks seated in recliner with set up and min verbal cues for figure four technique.  Eating: independent  Toileting: CGA for dynamic standing balance during clothing management-simulated task    Vitals/ Orthostatics:  At Rest: NAD  With Activity: BP=101/62 with c/o dizziness    Interventions Performed During Today's Session: role of OT, POC, post-op c-spine precautions, miami j management (pt states she was told to wear for stabilization post op), ADLs, figure four technique for LB ADLs, balance, functional mobility/transfers, monitoring vitals, safety, placing pillow vertically down head/back vs horizontally behind head, answered pt's questions within my scope of practice re: post op fatigue & why she had weakness before surgery, dispo recs including putting help she needs in perspective as pt initially stated I need a lot of help and my daughter isn't educated on how to help me.  Pt  encouraged to talk with daughter today re: staying with her at d/c and then to notify RN/CM of their decision in order to coordinate family training with daughter.  Pt verbalized understanding.      OT G-codes  Functional Assessment Tool Used: AMPAC  Score: 19  Functional Limitation: Self care  Self Care Current Status (Z6109): At least 40 percent but less than 60 percent impaired, limited or restricted  Self Care Goal Status (U0454): At least 20 percent but less than 40 percent impaired, limited or restricted  AMPAC score indicates pt has a 42.80% self care deficit.    Eval Duration (OT): 38 Min.    Medical Staff Made Aware: RN Minna Antis    I attest that I have reviewed the above information.  Signed: Virgina Evener, OT  Filed 03/06/2017

## 2017-03-06 NOTE — Unmapped (Signed)
[facility] Intake reviewed Laurell Roof record for potential admission.  Per record, Ms Vonita Moss is not appropriate for [facility] at this time as she does not meet [facility] criteria for therapeutic need.  Ms Vonita Moss has been removed from [facility] list at this time.      Myriam Forehand, [facility]  [facility]  Inpatient Coordinator  [phone]    10:47 AM 03/06/2017

## 2017-03-06 NOTE — Unmapped (Signed)
Problem: Patient Care Overview  Goal: Plan of Care Review  Outcome: Progressing  Pt up to bathroom with stand by assist only. Voiding, tolerating PO intake despite some c/o sore throat. PIV ML. Prn oxy being given for pain control. [place] J and JP drain in place. VSS, on RA. No BM overnight.

## 2017-03-07 DIAGNOSIS — M4712 Other spondylosis with myelopathy, cervical region: Principal | ICD-10-CM

## 2017-03-07 NOTE — Unmapped (Signed)
Initial Consult Note        Requesting Attending Physician:  Timothy Lasso, MD  Service Requesting Consult: Orthopedics [facility])     Assessment and Plan          Ashlee Christian is a 66 y.o. right handed female on whom I have been asked by Timothy Lasso, MD to consult for multiple sclerosis medication management. She has a history of MS on copaxone 40mg  three times weekly, followed by Dr. Johnnye Lana, cervical cord compression, HTN. She is currently admitted following emergent cervical cord decompression. She is recovering well and has only mild sensory changes to her examination.    -continue postoperative management. Discharge per primary team  -agree with PT/OT  -resume copaxone 40mg  three times weekly as outpatient, Patient's Daughter can bring home supply. If a few doses are missed while inpatient, Patient may resume typical schedule upon discharge  -I have sent a message to Dr. Johnnye Lana, who agrees with continuing come dose copaxone.   -recall as needed    Recommendations discussed with patient, patient's caregiver/family and primary team. This patient was seen and discussed with Dr. Hampton Abbot who agrees with the above assessment and plan.    Ashlee Amis, MD  PGY4, Neurology         HPI        Reason for Consult: MS medication management    Ashlee Christian is a 66 y.o. right handed female on whom I have been asked by Timothy Lasso, MD to consult for MS medication management. She has a history of MS on copaxone 40mg  three times weekly, followed by Dr. Johnnye Lana, cervical cord compression, HTN. 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 Thi surgery was initiall scheduled for 04/2017.    Last dose of Copaxone was Friday, on a MWF regimen.    Her first symptoms started at her age of 15 with right leg weakness and left sided facial sensory disturbances (hypersensitivity)    Denies new or worsening weakness. Is having constipation and urinary problems.    Double vision w/ prism glasses on RIGHT, longstanding, slightly worse since admission    Allergies   Allergen Reactions   ??? Amantadine Nausea Only   ??? Codeine Itching          Current Facility-Administered Medications   Medication Dose Route Frequency Provider Last Rate Last Dose   ??? acetaminophen (TYLENOL) tablet 650 mg  650 mg Oral Q4H PRN Martie Lee, MD   650 mg at 03/06/17 2021   ??? amLODIPine (NORVASC) tablet 2.5 mg  2.5 mg Oral Daily Martie Lee, MD   2.5 mg at 03/06/17 0911   ??? baclofen (LIORESAL) tablet 20 mg  20 mg Oral TID Martie Lee, MD   20 mg at 03/06/17 2020   ??? buPROPion (WELLBUTRIN SR) 12 hr tablet 150 mg  150 mg Oral BID Martie Lee, MD   150 mg at 03/06/17 2020   ??? calcium carbonate (TUMS) chewable tablet 400 mg of elem calcium  400 mg of elem calcium Oral Daily PRN Martie Lee, MD       ??? carBAMazepine (CARBATROL) 12 hr capsule 400 mg  400 mg Oral BID Martie Lee, MD  400 mg at 03/06/17 2020   ??? cholecalciferol (vitamin D3) tablet 4,000 Units  4,000 Units Oral Daily Martie Lee, MD   4,000 Units at 03/06/17 2021   ??? clonazePAM (KlonoPIN) tablet 1 mg  1 mg Oral TID PRN Joen Laura, MD   1 mg at 03/06/17 2236   ??? docusate sodium (COLACE) capsule 100 mg  100 mg Oral BID Martie Lee, MD   100 mg at 03/06/17 2020   ??? gabapentin (NEURONTIN) capsule 400 mg  400 mg Oral TID Joen Laura, MD   400 mg at 03/06/17 2020   ??? MORPhine 4 mg/mL injection 2 mg  2 mg Intravenous Q2H PRN Martie Lee, MD   2 mg at 03/06/17 1148    Or   ??? MORPhine 4 mg/mL injection 4 mg  4 mg Intravenous Q2H PRN Martie Lee, MD   4 mg at 03/07/17 0981   ??? omeprazole (PriLOSEC) capsule 40 mg  40 mg Oral Daily PRN Martie Lee, MD       ??? ondansetron (ZOFRAN-ODT) disintegrating tablet 4 mg  4 mg Oral Q6H PRN Martie Lee, MD       ??? oxyCODONE (ROXICODONE) immediate release tablet 5 mg  5 mg Oral Q4H PRN Martie Lee, MD        Or   ??? oxyCODONE (ROXICODONE) immediate release tablet 10 mg  10 mg Oral Q4H PRN Martie Lee, MD   10 mg at 03/07/17 0441   ??? phenol (CHLORASEPTIC) 1.4 % spray 2 spray  2 spray Mucous Membrane Q2H PRN Benson Setting, MD       ??? polyethylene glycol (MIRALAX) packet 17 g  17 g Oral BID PRN Joen Laura, MD   17 g at 03/06/17 2021      Prescriptions Prior to Admission   Medication Sig Dispense Refill Last Dose   ??? amLODIPine (NORVASC) 2.5 MG tablet Take 2.5 mg by mouth daily.   Past Week at Unknown time   ??? baclofen (LIORESAL) 20 MG tablet Take 1 tablet (20 mg total) by mouth Two (2) times a day. Morning and bedtime. (Patient taking differently: Take 20 mg by mouth Three (3) times a day. ) 60 tablet 5 Past Week at Unknown time   ??? buPROPion (WELLBUTRIN SR) 150 MG 12 hr tablet Take 1 tablet (150 mg total) by mouth Two (2) times a day. 180 tablet 5 Past Week at Unknown time   ??? carBAMazepine (CARBATROL) 200 MG 12 hr capsule Take 2 capsules (400 mg total) by mouth Two (2) times a day. 120 capsule 2 Past Week at Unknown time   ??? cholecalciferol, vitamin D3, 2,000 unit cap Take 2,000 Units by mouth daily.      ??? clonazePAM (KLONOPIN) 1 MG tablet Take 1.5 mg by mouth Three (3) times a day as needed for anxiety.    Past Week at Unknown time   ??? gabapentin (NEURONTIN) 400 MG capsule Take 400 mg by mouth Three (3) times a day.      ??? glatiramer 40 mg/mL Syrg Inject 1 mL (40 mg total) under the skin Three (3) times a week. Separate doses by at least 48 hours. 12 Syringe 5 Past Week at Unknown time   ??? methylPREDNISolone (MEDROL DOSEPACK) 4 mg tablet follow package directions 1 Package 0 Past Week at Unknown time   ??? omeprazole (PRILOSEC) 40 MG capsule Take 40 mg by mouth daily.  Past Week at Unknown time       Past Medical History:   Diagnosis Date   ??? Hypertension    ??? MS (multiple sclerosis) (CMS-HCC)    ??? Osteoporosis        Past Surgical History:   Procedure Laterality Date   ??? CATARACT EXTRACTION Bilateral 2016    [place]   ??? COLECTOMY     ??? EYE SURGERY Left 2012    Laser sx due to detached retina    ??? PR ALLOGRAFT FOR SPINE SURGERY ONLY MORSELIZED Midline 03/04/2017    Procedure: Allograft For Spine Surgery Only; Morselized;  Surgeon: Timothy Lasso, MD;  Location: MAIN OR [facility];  Service: Ortho Spine   ??? PR ANTERIOR INSTRUMENTATION 4-7 VERTEBRAL SEGMENTS Midline 03/04/2017    Procedure: Anterior Instrumentation; 4 To 7 Vertebral Segments;  Surgeon: Timothy Lasso, MD;  Location: MAIN OR [facility];  Service: Ortho Spine   ??? PR ARTHRODESIS ANT INTERBODY INC DISCECTOMY, CERVICAL BELOW C2 Midline 03/04/2017    Procedure: Arthrodes, Ant Intrbdy, Incl Disc Spc Prep, Discect, Osteophyt/Decompress Spinl Crd &/Or Nrv Rt, Crv Blo C2;  Surgeon: Timothy Lasso, MD;  Location: MAIN OR [facility];  Service: Ortho Spine   ??? PR ARTHRODESIS ANT INTERBODY MIN DISCECTOMY, CERVICAL BELOW C2  03/04/2017    Procedure: Arthrodesis, Anterior Interbody Technique, Include Minimal Diskectomy To Prep Interspace; Cervical Below C2;  Surgeon: Timothy Lasso, MD;  Location: MAIN OR [facility];  Service: Ortho Spine   ??? PR ARTHRODESIS ANT INTERBODY MIN DISCECTOMY,EA ADDL  03/04/2017    Procedure: Arthrodesis, Anterior Interbody Technique, W/Minimal Diskectomy To Prep Interspace; Each Add`L Interspace;  Surgeon: Timothy Lasso, MD;  Location: MAIN OR [facility];  Service: Ortho Spine   ??? PR AUTOGRAFT SPINE SURGERY LOCAL FROM SAME INCISION Midline 03/04/2017    Procedure: Autograft/Spine Surg Only (W/Harvest Graft); Local (Eg, Rib/Spinous Proc, Ples Specter) Obtain From Same Incis;  Surgeon: Timothy Lasso, MD;  Location: MAIN OR [facility];  Service: Ortho Spine   ??? PR INSJ BIOMCHN DEV INTERVERTEBRAL DSC SPC W/ARTHRD Midline 03/04/2017    Procedure: Insert Interbody Biomechanical Device(S) With Integral Anterior Instrument For Device Anchoring, When Performed, To Intervertebral Disc Space In Conjunction With Interbody Arthrodesis, Each Interspace;  Surgeon: Timothy Lasso, MD;  Location: MAIN OR [facility];  Service: Ortho Spine   ??? PR INSJ BIOMCHN DEV VRT CORPECTOMY DEFECT W/ARTHRD Midline 03/04/2017    Procedure: Insert Intervertebral Biomechanical Device(S) W Integral Anterior Instrument For Anchoring, When Performed, To Vert Corpectomy(Ies) Defect, In Conjunction W Interbody Arthrodesis, Each Contig Defect;  Surgeon: Timothy Lasso, MD;  Location: MAIN OR [facility];  Service: Ortho Spine   ??? PR REMV VERT BODY,CERV,ONE SGMT Midline 03/04/2017    Procedure: Vertebral Corpectomy-Ant W/Decomp; Cerv 1 Segmt;  Surgeon: Timothy Lasso, MD;  Location: MAIN OR [facility];  Service: Ortho Spine   ??? TONSILLECTOMY         Social History     Social History   ??? Marital status: Legally Separated     Spouse name: N/A   ??? Number of children: N/A   ??? Years of education: N/A     Social History Main Topics   ??? Smoking status: Never Smoker   ??? Smokeless tobacco: Never Used   ??? Alcohol use No   ??? Drug use: No   ??? Sexual activity: Not Asked     Other Topics Concern   ??? None     Social History Narrative  Lives with her husband.         Family History   Problem Relation Age of Onset   ??? Hypertension Mother    ??? Stroke Father    ??? Hypertension Sister    ??? No Known Problems Brother    ??? No Known Problems Maternal Aunt    ??? No Known Problems Maternal Uncle    ??? No Known Problems Paternal Aunt    ??? No Known Problems Paternal Uncle    ??? Stroke Maternal Grandmother    ??? No Known Problems Maternal Grandfather    ??? No Known Problems Paternal Grandmother    ??? No Known Problems Paternal Grandfather    ??? Amblyopia Neg Hx    ??? Blindness Neg Hx    ??? Cancer Neg Hx    ??? Cataracts Neg Hx    ??? Diabetes Neg Hx    ??? Glaucoma Neg Hx    ??? Macular degeneration Neg Hx ??? Retinal detachment Neg Hx    ??? Strabismus Neg Hx    ??? Thyroid disease Neg Hx        Code Status: Full Code     Review of Systems     A 10-system review of systems was conducted and was negative except as documented above in the HPI.       Objective        Temp:  [35.3 ??C-36.1 ??C] 36.1 ??C  Heart Rate:  [71-80] 77  Resp:  [17-20] 20  BP: (115-163)/(64-88) 137/70  MAP (mmHg):  [107] 107  SpO2:  [95 %-99 %] 95 %  No intake/output data recorded.    Physical Exam:  General: NAD, well nourished   Eyes: no tearing, discharge, no erythema   ENT: moist mucous membranes of the oral cavity.    Neck: in cervical collar  Cardiovascular: RRR  Lungs: CTAB  Skin: no rash, lesions, or breakdown   Psychiatry: mood and affect are appropriate   Abdomen: soft, non tender, non distended  Extremities: no cyanosis, clubbing or edema.    Neurological exam:  MENTAL STATUS: Alert and oriented to person, place, and time, forgot President. Attention and concentration within normal limits. Speech without dysarthria, able to name and repeat without difficulty.    CRANIAL NERVES: Visual fields intact, 1.5 vision on primary gaze. PERRL.  Extraocular movements are intact with no nystagmus. At rest face symmetrical. Normal facial movement bilaterally, including forehead, eye closure and grimace/smile. Facial sensation intact bilaterally to light touch in all three divisions of CN V. Hearing grossly intact bilaterally. Sternocleidomastoids are 5/5 in strength bilaterally. Palate movement is symmetric. Tongue protrudes midline.  Tongue movements are normal.     MOTOR EXAM: Normal bulk and tone. No pronator drift.  4+-5/5 deltoid, biceps, triceps, interossei, hand grip and finger extension bilaterally. 4+5/5 iliopsoas, knee extension/flexion, foot dorsi/plantar flexion bilaterally.      REFLEXES: Biceps 2+  bilaterally, knee trace to 1+  bilaterally, achilles trace to 1+  bilaterally. Toes are down going bilaterally.     SENSORY EXAM: Sensory exam normal to light touch, pin prick, temperature, Joint position sense and vibration in all BUE extremities distally. Reduced temp on left foot  Reduced vibration left great toe, absent to Right ankle       CEREBELLAR/COORDINATION:  Normal rapid alternating movements bilaterally. No abnormalities on finger-to-nose testing bilaterally.      GAIT: normal based gait, stable           Diagnostic Studies  All Labs Last 24hrs: No results found for this or any previous visit (from the past 24 hour(s)).    MRI cervical spine with and without contrast, 03/03/17, personally reviewed:  Significant stenosis at C4/5 and C5/6. Degenerative disc disease

## 2017-03-07 NOTE — Unmapped (Signed)
Problem: Patient Care Overview  Goal: Plan of Care Review  Outcome: Progressing  Pt takes glatiramer injections at home M/W/F. Pt would like to take this while in house, she has already missed 1 dose (monday) thus far. Daughter will bring home med today 7/18. ??    Per pt report, she has urinary retention at baseline due to MS and medications. PVR , I&O cathed for . Of note, this is the 2nd time pt has been cathed post foley removal. Will bladder scan again this AM.    VSS, remains on RA. [place] J in place. PIV ML.      PRN oxy and klonopin given as needed

## 2017-03-07 NOTE — Unmapped (Signed)
The Venous Access Team (VAT) received a request from care RN.   PIV placed.           PIV Workup / Procedure Time:  15 minutes      The primary RN was notified.       Thank you,     Laurann Montana RN Venous Access Team

## 2017-03-07 NOTE — Unmapped (Signed)
Patient with multiple sclerosis who was admitted s/p surgery for cervical cord compression. She reports urinary retention at baseline which has been worse post-operatively. She has failed a trial of void with multiple I&Os and PVRs > 650 mL. Foley catheter has been replaced and urology was contacted to set up follow-up.    Recommend continued foley catheter for 7-10 days for bladder rest. Will arrange appropriate follow-up.         UROLOGY FOLLOW UP REQUEST    Requesting physician/contact for questions: Dossie Arbour      Date(s) Needed: 7-10 days    Appt to be scheduled under: APPs or any available provider - ideally Dr. Raoul Pitch given patient's history of MS     Type of Appt: New patient - urinary retention    Need to contact patient: Yes please    Special Requests/Instructions: None    Orders placed for Special Requests: N/A

## 2017-03-07 NOTE — Unmapped (Signed)
Orthopaedic Surgery Progress Note  Primary Team: Orthopedics [facility])    S/p emergent C3-7 ACDF w/C5 corpectomy 7/15 with [facility]    Assessment/Plan:  66 y.o. female with h/o MS and cervical myelopathy w/ worsening UE weakness s/p emergent C3-7 ACDF w/ C5 corpectomy 7/15, doing well postoperatively.    - Pain control via multimodal regimen, encourage prn with gradual transition to po  - Antibiotics: ancef x23h postoperatively  - Decadron 10 Q6H x24h post-op  - Weightbearing status: WBAT, activity as tolerated  - Dressing: Keep clean, dry, and intact.   - Drain pulled 7/17  - HOB elevated POD1  - PT/OT.  - Upright XRs after mobilizing with PT  - Diet: Regular  - Labs: none  - Foley: foley reinserted due to retention, appreciate urology consult for follow up and recommendations if appropriate  - VTE prophylaxis plan/recommendation: Mechanical/SCDs    - Plan for day: pain control and PT mobilization  - EDD: 7/17-18  - Discharge plan: Home if daughter available, otherwise would prefer AIR  - Barriers to discharge: pain control, clearance by PT/OT  - Follow-up: 10-14d post-operatively    -----------------------------------------------------------------------------------------------  - Current orthopaedic contact resident: Koriana Stepien  - Current orthopaedic care attending: Dr. Cameron Ali  - On nights (6pm-6am), weekends, and holidays, please page Orthopaedic Consult pager.    Subjective:  No acute events since surgery. Reports mild pain about operative site, mild throat soreness. No new N/T.  Feels much stronger in UE/LE No chest pain. No SOB. No nausea/vomiting. Some dysphagia but able to swallow, no difficulty breathing.    Objective:   BP 148/85  - Pulse 76  - Temp 35.9 ??C (Oral)  - Resp 18  - Ht 167.6 cm (5' 6)  - Wt 65.3 kg (144 lb)  - SpO2 95%  - BMI 23.24 kg/m??     Physical Exam:    General: NAD  CV: extremities WWP  Pulm: NWOB, SORA  Neuro: awake/alert    Neck:  Dressing c/d/i without drainage.  Drain had scant SS drainage; pulled in AM  [place] J collar in place.    Sensation: Intact to light touch in the bilat hands and bilat feet.   ??  Motor R L   Deltoid 5 5   Bicep 5- 5-   Tricep 5-   5-   WE 5 5   Grip 5 5   IO 5 5   IP 4 4   Quad 4 4   TA 5 5   EHL 5 5   GS 5 5       Laboratory Data:   Lab Results   Component Value Date    WBC 6.8 03/04/2017    RBC 4.14 03/04/2017    HGB 13.1 03/04/2017    HCT 38.6 03/04/2017    MCV 93.2 03/04/2017    MCH 31.6 03/04/2017    MCHC 33.9 03/04/2017    RDW 12.7 03/04/2017    PLT 336 03/04/2017       Lab Results   Component Value Date    NA 130 (L) 03/04/2017    K 3.9 03/04/2017    CL 98 03/04/2017    CO2 24.0 03/04/2017    BUN 14 03/04/2017    CREATININE 0.55 (L) 03/04/2017    GLU 115 03/04/2017    CALCIUM 9.1 03/04/2017    ALBUMIN 4.3 10/13/2016       Imaging:  None

## 2017-03-07 NOTE — Unmapped (Signed)
July 25@ 2:45 w/ Dr Raoul Pitch

## 2017-03-07 NOTE — Unmapped (Signed)
Problem: Patient Care Overview  Goal: Plan of Care Review  Outcome: Not Progressing  This morning after MD pulled out JP according to NA pt. May have fainted. Pt. Was awake when this RN walked into room by was was 72/45 p. 92. Pt. States was not feeling well and pt. States she has fainted in past with the site of blood. Pt. Encouraged to drink and drank approx 1200 ml of water and bp gradually was able to increase to 122/72. Pt. Started to feel better and Dr. Gypsy Balsam present to assess pt. And ordered IVF bolus of . Talked with pt. A feels she may have had vagal response. Pt. BP throughout the day had been labile from low 100's to 170's systolic and was getting anxious with the increased BP. Paged Dr. Venetia Maxon and dosage of Neurontin changed and lowered. IVF stopped. Pt. Also, requesting for Mag Citrate due to having small hard stools no results as of yet. Pt. Also had been voiding adequately all day 500-738ml each episode but feels she had not emptied her bladder. Bladder scanned her for and MD notified and ordered I&O x1 and resulted. Pt. Has been voided large sums of urine. Pt. States Tegretol medication may cause some retention, MD informed. Pt. Given Klonipin this afternoon for anxiety with good results. Pt. Also receiving oxycodone and 1 dose of Morphine for surgical pain with adequate relief. [place] J collar in place. Daughter was present at bedside all day.     Problem: Self-Care Deficit (Adult,Obstetrics,Pediatric)  Goal: Identify Related Risk Factors and Signs and Symptoms  Related risk factors and signs and symptoms are identified upon initiation of Human Response Clinical Practice Guideline (CPG).   Outcome: Progressing      Problem: Fall Risk (Adult)  Goal: Identify Related Risk Factors and Signs and Symptoms  Related risk factors and signs and symptoms are identified upon initiation of Human Response Clinical Practice Guideline (CPG).   Outcome: Progressing      Problem: Skin Injury Risk (Adult)  Goal: Identify Related Risk Factors and Signs and Symptoms  Related risk factors and signs and symptoms are identified upon initiation of Human Response Clinical Practice Guideline (CPG).   Outcome: Progressing      Problem: VTE, DVT and PE (Adult)  Goal: Signs and Symptoms of Listed Potential Problems Will be Absent, Minimized or Managed (VTE, DVT and PE)  Signs and symptoms of listed potential problems will be absent, minimized or managed by discharge/transition of care (reference VTE, DVT and PE (Adult) CPG).   Outcome: Progressing

## 2017-03-08 NOTE — Unmapped (Signed)
Problem: Patient Care Overview  Goal: Plan of Care Review  Outcome: Progressing  No acute events this shift, pt. Progressing appropriately. A/O x4. VSS. O2 sats maintained on RA. PRN pain medication given during shift. Surgical dressings remain clean, dry, and intact. PIV saline locked. Adequate intake and output through shift. Pt. Ambulated and worked with PT/OT. Pt. States throat very swollen, MD notified. All safety measures in place, will continue to monitor closely.  Goal: Individualization and Mutuality  Outcome: Progressing    Goal: Discharge Needs Assessment  Outcome: Progressing    Goal: Interprofessional Rounds/Family Conf  Outcome: Progressing      Problem: Self-Care Deficit (Adult,Obstetrics,Pediatric)  Goal: Identify Related Risk Factors and Signs and Symptoms  Related risk factors and signs and symptoms are identified upon initiation of Human Response Clinical Practice Guideline (CPG).   Outcome: Progressing    Goal: Improved Ability to Perform BADL and IADL  Patient will demonstrate the desired outcomes by discharge/transition of care.   Outcome: Progressing      Problem: Fall Risk (Adult)  Goal: Identify Related Risk Factors and Signs and Symptoms  Related risk factors and signs and symptoms are identified upon initiation of Human Response Clinical Practice Guideline (CPG).   Outcome: Progressing    Goal: Absence of Fall  Patient will demonstrate the desired outcomes by discharge/transition of care.   Outcome: Progressing      Problem: Skin Injury Risk (Adult)  Goal: Identify Related Risk Factors and Signs and Symptoms  Related risk factors and signs and symptoms are identified upon initiation of Human Response Clinical Practice Guideline (CPG).   Outcome: Progressing    Goal: Skin Health and Integrity  Patient will demonstrate the desired outcomes by discharge/transition of care.   Outcome: Progressing      Problem: VTE, DVT and PE (Adult)  Goal: Signs and Symptoms of Listed Potential Problems Will be Absent, Minimized or Managed (VTE, DVT and PE)  Signs and symptoms of listed potential problems will be absent, minimized or managed by discharge/transition of care (reference VTE, DVT and PE (Adult) CPG).   Outcome: Progressing

## 2017-03-08 NOTE — Unmapped (Signed)
Orthopaedic Surgery Progress Note  Primary Team: Orthopedics [facility])    S/p emergent C3-7 ACDF w/C5 corpectomy 7/15 with [facility]    Assessment/Plan:  66 y.o. female with h/o MS and cervical myelopathy w/ worsening UE weakness s/p emergent C3-7 ACDF w/ C5 corpectomy 7/15, doing well postoperatively.    - Pain control via multimodal regimen, encourage prn with gradual transition to po  - Antibiotics: ancef x23h postoperatively  - Decadron 10 Q6H x24h post-op  - Weightbearing status: WBAT, activity as tolerated  - Dressing: Keep clean, dry, and intact.   - Drain pulled 7/17  - HOB elevated POD1  - PT/OT.  - Upright XRs after mobilizing with PT  - Diet: Regular  - Labs: none  - Foley: foley reinserted due to retention, appreciate urology consult for follow up and recommendations if appropriate  - VTE prophylaxis plan/recommendation: Mechanical/SCDs    - Plan for day: pain control and PT mobilization  - EDD: 7/17-18  - Discharge plan: Home if daughter available, otherwise would prefer AIR  - Barriers to discharge: pain control, clearance by PT/OT  - Follow-up: 10-14d post-operatively    -----------------------------------------------------------------------------------------------  - Current orthopaedic contact resident: Armonii Sieh  - Current orthopaedic care attending: Dr. Cameron Ali  - On nights (6pm-6am), weekends, and holidays, please page Orthopaedic Consult pager.    Subjective:  No acute events since surgery. Reports mild pain about operative site, mild throat soreness. No new N/T.  Feels much stronger in UE/LE No chest pain. No SOB. No nausea/vomiting. Some dysphagia but able to swallow, no difficulty breathing.    Objective:   BP 141/69  - Pulse 85  - Temp 36.4 ??C (Oral)  - Resp 16  - Ht 167.6 cm (5' 6)  - Wt 65.3 kg (144 lb)  - SpO2 94%  - BMI 23.24 kg/m??     Physical Exam:    General: NAD  CV: extremities WWP  Pulm: NWOB, SORA  Neuro: awake/alert    Neck:  Dressing c/d/i without drainage.  Drain had scant SS drainage; pulled in AM  [place] J collar in place.    Sensation: Intact to light touch in the bilat hands and bilat feet.   ??  Motor R L   Deltoid 5 5   Bicep 5- 5-   Tricep 5-   5-   WE 5 5   Grip 5 5   IO 5 5   IP 4 4   Quad 4 4   TA 5 5   EHL 5 5   GS 5 5       Laboratory Data:   Lab Results   Component Value Date    WBC 6.8 03/04/2017    RBC 4.14 03/04/2017    HGB 13.1 03/04/2017    HCT 38.6 03/04/2017    MCV 93.2 03/04/2017    MCH 31.6 03/04/2017    MCHC 33.9 03/04/2017    RDW 12.7 03/04/2017    PLT 336 03/04/2017       Lab Results   Component Value Date    NA 130 (L) 03/04/2017    K 3.9 03/04/2017    CL 98 03/04/2017    CO2 24.0 03/04/2017    BUN 14 03/04/2017    CREATININE 0.55 (L) 03/04/2017    GLU 115 03/04/2017    CALCIUM 9.1 03/04/2017    ALBUMIN 4.3 10/13/2016       Imaging:  None

## 2017-03-08 NOTE — Unmapped (Signed)
[facility] [address] [place], [place] [address] [phone]

## 2017-03-08 NOTE — Unmapped (Signed)
Problem: Patient Care Overview  Goal: Plan of Care Review  Outcome: Progressing  No acute events this shift. VSS, O2 maintained on RA. Pt received PRN Oxycodone for pain management and Klonopin for anxiety. C/o sore throat, MD aware, chloraseptic spray administered, pt reported that it helped a little. Daughter reported that they have found a facility with an available bed they are interested, will pass it on to day shift to notify case manager. Ambulating without difficulty. Adequate po intake and urine output. No signs of skin breakdown. All safety measures in place, will continue to monitor.    Problem: Self-Care Deficit (Adult,Obstetrics,Pediatric)  Goal: Identify Related Risk Factors and Signs and Symptoms  Related risk factors and signs and symptoms are identified upon initiation of Human Response Clinical Practice Guideline (CPG).   Outcome: Progressing      Problem: Fall Risk (Adult)  Goal: Identify Related Risk Factors and Signs and Symptoms  Related risk factors and signs and symptoms are identified upon initiation of Human Response Clinical Practice Guideline (CPG).   Outcome: Progressing      Problem: Skin Injury Risk (Adult)  Goal: Identify Related Risk Factors and Signs and Symptoms  Related risk factors and signs and symptoms are identified upon initiation of Human Response Clinical Practice Guideline (CPG).   Outcome: Progressing      Problem: VTE, DVT and PE (Adult)  Goal: Signs and Symptoms of Listed Potential Problems Will be Absent, Minimized or Managed (VTE, DVT and PE)  Signs and symptoms of listed potential problems will be absent, minimized or managed by discharge/transition of care (reference VTE, DVT and PE (Adult) CPG).   Outcome: Progressing

## 2017-03-09 ENCOUNTER — Ambulatory Visit: Admit: 2017-03-09 | Discharge: 2017-03-09 | Payer: MEDICARE

## 2017-03-14 ENCOUNTER — Ambulatory Visit
Admission: RE | Admit: 2017-03-14 | Discharge: 2017-03-14 | Disposition: A | Discharge status: Home / Self Care | Payer: MEDICARE | Admitting: Urology

## 2017-03-14 DIAGNOSIS — R339 Retention of urine, unspecified: Principal | ICD-10-CM

## 2017-03-14 DIAGNOSIS — N319 Neuromuscular dysfunction of bladder, unspecified: Secondary | ICD-10-CM

## 2017-03-14 NOTE — Unmapped (Signed)
Assessment: 66 y.o. female with history of multiple sclerosis (MS) and cervical myelopathy s/p emergent C3-7 ACDF on 03/04/2017 with history of urinary retention and Valsalva voiding. Recurrent urinary tract infections have not been an issue for her for the past several years, she is no longer on prophylactic antibiotic therapy. She does endorse significant straining to produce volitional voids.    She has never had video urodynamics in the past. She is amenable to this in the future. She would like to learn how to self catheterize, however, given C-Collar is unable to at this time.     Plan:    - PVR of 14ml  - Defer CIC teaching until after C-Collar off  - VUDS in late Sept-Oct  - RTC 10-14 days (will have collar off at this time)    ______________________________________________________________________    Referring Physician:   Per Patient Referring Unknown  No address on file    PCP:   No PCP Per Patient    Chief Complaint:    HPI:   66 y.o. female seen in consultation at the request of Dr. Frederik Pear for urinary retention s/p C3-7 ACDF on 03/04/2017. Patient was diagnosed with multiple sclerosis in 09/2007, her initial symptoms started many years prior. Patient with multiple sclerosis who was recently admitted s/p surgery for cervical cord compression 7/15. Patient reports urinary retention at baseline which was worse post-operatively. She failed a trial of void with multiple I&Os and PVRs > 650 mL during admission. However, at time of discharge patient was able to void volitionally with Valsalva.    Patient previously been followed by her local urologist in [place] for history of rUTI.     Neurologic History   -Primary neurologic disease: multiple sclerosis     Mobility: Cane    Current Bladder Management: Voiding spontaneously    Hesitancy: yes - over 1 year   Straining: yes   Postures to void: occasionally   Intermittency: no   Sensation of Incomplete Emptying: yes    Urologic Surgery: no Hysterectomy: [facility])    With bladder suspension   Colon bypass 2000 2/2 unclear etiology, possible diverticulitis    Urologic Medications: no     Urinary Incontinence: yes   Type of Incontinence: stress, ?rare overflow   Triggers: strong cough   Daily incontinence episodes: no, weekly   Daily pad usage: only when goes out for social events     Urinary Tract Infections: yes -    Frequency: several years at least   Symptoms: frequency, urgency, dysuria   Cultures:     10/19/2015: No growth    05/29/2014: Mixed GU    Urodynamics: None prior    Imaging: No lower tract imaging    Labs: Creatinine 0.55 as of 03/04/17    Bowel dysfunction: yes - daily   - Bowel program: Colace      Medical History  Past Medical History:   Diagnosis Date   ??? Hypertension    ??? MS (multiple sclerosis) (CMS-HCC)    ??? Osteoporosis        Surgical History  Past Surgical History:   Procedure Laterality Date   ??? CATARACT EXTRACTION Bilateral 2016    [place]   ??? COLECTOMY     ??? EYE SURGERY Left 2012    Laser sx due to detached retina    ??? PR ALLOGRAFT FOR SPINE SURGERY ONLY MORSELIZED Midline 03/04/2017    Procedure: Allograft For Spine Surgery Only; Morselized;  Surgeon: Timothy Lasso,  MD;  Location: MAIN OR [place];  Service: Ortho Spine   ??? PR ANTERIOR INSTRUMENTATION 4-7 VERTEBRAL SEGMENTS Midline 03/04/2017    Procedure: Anterior Instrumentation; 4 To 7 Vertebral Segments;  Surgeon: Timothy Lasso, MD;  Location: MAIN OR [facility];  Service: Ortho Spine   ??? PR ARTHRODESIS ANT INTERBODY INC DISCECTOMY, CERVICAL BELOW C2 Midline 03/04/2017    Procedure: Arthrodes, Ant Intrbdy, Incl Disc Spc Prep, Discect, Osteophyt/Decompress Spinl Crd &/Or Nrv Rt, Crv Blo C2;  Surgeon: Timothy Lasso, MD;  Location: MAIN OR [facility];  Service: Ortho Spine   ??? PR ARTHRODESIS ANT INTERBODY MIN DISCECTOMY, CERVICAL BELOW C2  03/04/2017    Procedure: Arthrodesis, Anterior Interbody Technique, Include Minimal Diskectomy To Prep Interspace; Cervical Below C2;  Surgeon: Timothy Lasso, MD;  Location: MAIN OR [facility];  Service: Ortho Spine   ??? PR ARTHRODESIS ANT INTERBODY MIN DISCECTOMY,EA ADDL  03/04/2017    Procedure: Arthrodesis, Anterior Interbody Technique, W/Minimal Diskectomy To Prep Interspace; Each Add`L Interspace;  Surgeon: Timothy Lasso, MD;  Location: MAIN OR [facility];  Service: Ortho Spine   ??? PR AUTOGRAFT SPINE SURGERY LOCAL FROM SAME INCISION Midline 03/04/2017    Procedure: Autograft/Spine Surg Only (W/Harvest Graft); Local (Eg, Rib/Spinous Proc, Ples Specter) Obtain From Same Incis;  Surgeon: Timothy Lasso, MD;  Location: MAIN OR [facility];  Service: Ortho Spine   ??? PR INSJ BIOMCHN DEV INTERVERTEBRAL DSC SPC W/ARTHRD Midline 03/04/2017    Procedure: Insert Interbody Biomechanical Device(S) With Integral Anterior Instrument For Device Anchoring, When Performed, To Intervertebral Disc Space In Conjunction With Interbody Arthrodesis, Each Interspace;  Surgeon: Timothy Lasso, MD;  Location: MAIN OR [facility];  Service: Ortho Spine   ??? PR INSJ BIOMCHN DEV VRT CORPECTOMY DEFECT W/ARTHRD Midline 03/04/2017    Procedure: Insert Intervertebral Biomechanical Device(S) W Integral Anterior Instrument For Anchoring, When Performed, To Vert Corpectomy(Ies) Defect, In Conjunction W Interbody Arthrodesis, Each Contig Defect;  Surgeon: Timothy Lasso, MD;  Location: MAIN OR [facility];  Service: Ortho Spine   ??? PR REMV VERT BODY,CERV,ONE SGMT Midline 03/04/2017    Procedure: Vertebral Corpectomy-Ant W/Decomp; Cerv 1 Segmt;  Surgeon: Timothy Lasso, MD;  Location: MAIN OR [facility];  Service: Ortho Spine   ??? TONSILLECTOMY         Social History:  Patient  reports that she has never smoked. She has never used smokeless tobacco. She reports that she does not drink alcohol or use drugs.     Family History:  The patient's family history includes Hypertension in her mother and sister; No Known Problems in her brother, maternal aunt, maternal grandfather, maternal uncle, paternal aunt, paternal grandfather, paternal grandmother, and paternal uncle; Stroke in her father and maternal grandmother.     Medications:  Current Outpatient Prescriptions   Medication Sig Dispense Refill   ??? acetaminophen (TYLENOL) 325 MG tablet Take 3 tablets (975 mg total) by mouth every eight (8) hours as needed. 270 tablet 0   ??? amLODIPine (NORVASC) 2.5 MG tablet Take 2.5 mg by mouth daily.     ??? baclofen (LIORESAL) 20 MG tablet Take 1 tablet (20 mg total) by mouth Two (2) times a day. Morning and bedtime. (Patient taking differently: Take 20 mg by mouth Three (3) times a day. ) 60 tablet 5   ??? buPROPion (WELLBUTRIN SR) 150 MG 12 hr tablet Take 1 tablet (150 mg total) by mouth Two (2) times a day. 180 tablet 5   ??? carBAMazepine (CARBATROL) 200 MG 12 hr capsule  Take 2 capsules (400 mg total) by mouth Two (2) times a day. 120 capsule 2   ??? cholecalciferol, vitamin D3, 2,000 unit cap Take 2,000 Units by mouth daily.     ??? clonazePAM (KLONOPIN) 1 MG tablet Take 1.5 mg by mouth Three (3) times a day as needed for anxiety.      ??? docusate sodium (COLACE) 100 MG capsule Take 1 capsule (100 mg total) by mouth Two (2) times a day. 60 capsule 0   ??? gabapentin (NEURONTIN) 400 MG capsule Take 400 mg by mouth Three (3) times a day.     ??? glatiramer 40 mg/mL Syrg Inject 1 mL (40 mg total) under the skin Three (3) times a week. Separate doses by at least 48 hours. 12 Syringe 5   ??? methylPREDNISolone (MEDROL DOSEPACK) 4 mg tablet follow package directions 1 Package 0   ??? omeprazole (PRILOSEC) 40 MG capsule Take 40 mg by mouth daily.        No current facility-administered medications for this visit.        Allergies:  Amantadine and Codeine     ROS:   A comprehensive 10-system review was negative, except as noted in HPI.    Physical Exam:  BP 102/73  - Pulse 84  - Temp 36.5 ??C (Oral)  - Wt 59 kg (130 lb)  - BMI 20.98 kg/m??   General: well developed, well nourished, no acute distress.  Psychiatric: alert, well groomed female  Neurologic:  Ambulates with cane.   HEENT: PERLA, EOM intact, normocephalic, atraumatic  Neck: supple and no masses. Currently in C-Collar.  Chest: symmetrical  Lungs: non-labored breathing  Heart: normal rhythm, no JVD  Abdomen: no tenderness, no masses or hernias, no palpable organomegaly  GU: deferred today  Rectal: deferred today  Extremities: no deformities, no edema, no cyanosis    Procedures:   PVR 14ml    Labs:   Results for orders placed or performed during the hospital encounter of 03/03/17   Basic Metabolic Panel   Result Value Ref Range    Sodium 134 (L) 135 - 145 mmol/L    Potassium 5.5 (H) 3.5 - 5.0 mmol/L    Chloride 97 (L) 98 - 107 mmol/L    CO2 26.0 22.0 - 30.0 mmol/L    BUN 10 7 - 21 mg/dL    Creatinine 1.61 (L) 0.60 - 1.00 mg/dL    BUN/Creatinine Ratio 19     EGFR MDRD Non Af Amer >=60 >=60 mL/min/1.1m2    EGFR MDRD Af Amer >=60 >=60 mL/min/1.93m2    Anion Gap 11 9 - 15 mmol/L    Glucose 103 (H) 65 - 99 mg/dL    Calcium 8.9 8.5 - 09.6 mg/dL   PT-INR   Result Value Ref Range    PT 11.8 10.3 - 13.3 sec    INR 1.04    APTT   Result Value Ref Range    APTT 26.3 (L) 27.7 - 37.7 sec    Heparin Correlation <0.2    Urinalysis with Culture Reflex   Result Value Ref Range    Color, UA Colorless     Clarity, UA Clear     Specific Gravity, UA 1.005 1.003 - 1.030    pH, UA 7.5 5.0 - 9.0    Leukocyte Esterase, UA Negative Negative    Nitrite, UA Negative Negative    Protein, UA Negative Negative    Glucose, UA Negative Negative    Ketones, UA Negative Negative  Urobilinogen, UA 0.2 mg/dL 0.2 mg/dL, 1.0 mg/dL    Bilirubin, UA Negative Negative    Blood, UA Negative Negative    RBC, UA <1 <4 /HPF    WBC, UA 1 0 - 5 /HPF    Squam Epithel, UA <1 0 - 5 /HPF    Bacteria, UA None Seen None Seen /HPF   Protime-INR   Result Value Ref Range    PT 10.8 10.2 - 12.8 sec    INR 0.95    APTT   Result Value Ref Range    APTT 25.8 (L) 27.7 - 37.7 sec    Heparin Correlation <0.2    CBC   Result Value Ref Range WBC 6.8 4.5 - 11.0 10*9/L    RBC 4.14 4.00 - 5.20 10*12/L    HGB 13.1 12.0 - 16.0 g/dL    HCT 16.6 06.3 - 01.6 %    MCV 93.2 80.0 - 100.0 fL    MCH 31.6 26.0 - 34.0 pg    MCHC 33.9 31.0 - 37.0 g/dL    RDW 01.0 93.2 - 35.5 %    MPV 6.3 (L) 7.0 - 10.0 fL    Platelet 336 150 - 440 10*9/L   Basic Metabolic Panel   Result Value Ref Range    Sodium 131 (L) 135 - 145 mmol/L    Potassium 4.4 3.5 - 5.0 mmol/L    Chloride 98 98 - 107 mmol/L    CO2 24.0 22.0 - 30.0 mmol/L    BUN 14 7 - 21 mg/dL    Creatinine 7.32 (L) 0.60 - 1.00 mg/dL    BUN/Creatinine Ratio 25     EGFR MDRD Non Af Amer >=60 >=60 mL/min/1.67m2    EGFR MDRD Af Amer >=60 >=60 mL/min/1.82m2    Anion Gap 9 9 - 15 mmol/L    Glucose 115 65 - 179 mg/dL    Calcium 9.1 8.5 - 20.2 mg/dL   ABGs w/LYTES   Result Value Ref Range    Specimen Source Arterial     FIO2 Arterial 50%     pH, Arterial 7.43 7.35 - 7.45    pCO2, Arterial 37.9 35.0 - 45.0 mm Hg    pO2, Arterial 215.0 (H) 80.0 - 110.0 mm Hg    HCO3 (Bicarbonate), Arterial 25 22 - 27 mmol/L    Base Excess, Arterial 0.6 -2.0 - 2.0    O2 Sat, Arterial 99.4 94.0 - 100.0 %    Sodium Whole Blood 130 (L) 135 - 145 mmol/L    Potassium, Bld 3.9 3.4 - 4.6 mmol/L    Calcium, Ionized Arterial 4.47 4.40 - 5.40 mg/dL    Glucose Whole Blood 114 Undefined mg/dL    Lactate, Arterial 1.0 <=1.2 mmol/L    Hgb, blood gas 11.60 (L) 12.00 - 16.00 g/dL   ECG 12 lead (Adult)   Result Value Ref Range    EKG Systolic BP  mmHg    EKG Diastolic BP  mmHg    EKG Ventricular Rate 69 BPM    EKG Atrial Rate 69 BPM    EKG P-R Interval 176 ms    EKG QRS Duration 102 ms    EKG Q-T Interval 402 ms    EKG QTC Calculation 430 ms    EKG Calculated P Axis 58 degrees    EKG Calculated R Axis 46 degrees    EKG Calculated T Axis 67 degrees   Type and Screen   Result Value Ref Range    Check Type  Needed Type Check     Blood Type B POS     Antibody Screen NEG    Type and Screen   Result Value Ref Range    ABO Grouping B POS    ABSC GEL   Result Value Ref Range Antibody Screen NEG    CBC w/ Differential   Result Value Ref Range    WBC 9.4 4.5 - 11.0 10*9/L    RBC 4.32 4.00 - 5.20 10*12/L    HGB 13.8 13.5 - 16.0 g/dL    HCT 29.5 62.1 - 30.8 %    MCV 94.4 80.0 - 100.0 fL    MCH 31.9 26.0 - 34.0 pg    MCHC 33.8 31.0 - 37.0 g/dL    RDW 65.7 84.6 - 96.2 %    MPV 5.6 (L) 7.0 - 10.0 fL    Platelet 294 150 - 440 10*9/L    Neutrophil Left Shift 1+ (A) Not Present    Absolute Neutrophils 8.2 (H) 2.0 - 7.5 10*9/L    Absolute Lymphocytes 0.7 (L) 1.5 - 5.0 10*9/L    Absolute Monocytes 0.2 0.2 - 0.8 10*9/L    Absolute Eosinophils 0.2 0.0 - 0.4 10*9/L    Absolute Basophils 0.0 0.0 - 0.1 10*9/L    Large Unstained Cells 1 0 - 4 %

## 2017-03-21 ENCOUNTER — Ambulatory Visit
Admission: RE | Admit: 2017-03-21 | Discharge: 2017-03-21 | Disposition: A | Discharge status: Home / Self Care | Payer: MEDICARE

## 2017-03-21 DIAGNOSIS — G959 Disease of spinal cord, unspecified: Principal | ICD-10-CM

## 2017-03-21 DIAGNOSIS — Z981 Arthrodesis status: Principal | ICD-10-CM

## 2017-03-21 MED ORDER — OXYCODONE 5 MG TABLET
ORAL_TABLET | ORAL | 0 refills | 0 days | Status: CP | PRN
Start: 2017-03-21 — End: 2017-04-27

## 2017-03-21 NOTE — Unmapped (Signed)
??   Your diagnosis: Cervical Fusion    ?? Recommendations/Plan  ?? Medications: We have prescribed a refill on Oxycodone  ?? Activities as tolerated.  ?? Discontinue collar/brace.  ?? May return to driving (when off pain medications for 48 hours).  ?? Imaging studies: Cervical AP and lateral views (prior to next visit).  ?? Follow-up: As scheduled     ?? Avoid nicotine/tobacco  ?? Minimize alcohol intake  ?? Maintain a healthy weight    ?? Stay physically active and exercise regularly as tolerated ([email or web address])  ?? Maintain good sleeping habits    ?? We want to improve, and you can help.  You may receive a survey asking you about your visit.  Please take a few minutes to complete the survey.  We will use your feedback to make improvements.    ?? Contact our team electronically via MyChart message to [facility] Clinical Staff.    ?? Contact our team at [phone] or [phone] with any questions/concerns.    ?? Please visit and like [place] on our Facebook page to learn more about our services.   [email or web address]

## 2017-03-21 NOTE — Unmapped (Signed)
Specialty Pharmacy Refill Coordination Note     Ashlee Christian. Ashlee Christian is a 66 y.o. female contacted today regarding refills of her specialty medication(s).    Reviewed and verified with patient:      Specialty medication(s) and dose(s) confirmed: yes  Changes to medications: no  Changes to insurance: no    Medication Adherence    Patient reported X missed doses in the last month:  0  Specialty Medication:  COPAXONE  Patient is on additional specialty medications:  No  Informant:  patient  Confirmed plan for next specialty medication refill:  delivery by pharmacy  Medication Assistance Program  Refill Coordination  Has the Patient's Contact Information Changed:  No  Is the Shipping Address Different:  No  Shipping Information  Delivery Scheduled:  Yes  Delivery Date:  03/28/17  Medications to be Shipped:  COPAXONE          Follow-up: 3 week(s)     **ADVISED PT FILLING LAST REFILL. SHE REPORTED AN APPOINTMENT IN September, BUT I TOLD HER THAT IT APPEARED IT WAS CANCELLED, AND RESCHEDULED FOR November. I ALSO ADVISED I COULD BE MISINTERPRETTING THE INFORMATION AS I DO NOT WORK WITH SCHEDULING APPOINTMENTS. TRANSFERRED PT TO NEURO CLINIC TO CHECK ON APPT OR RESCHEDULE FOR BEFORE November.     Westley Gambles  Specialty Pharmacy Technician

## 2017-03-21 NOTE — Unmapped (Signed)
ORTHOPAEDIC SPINE RETURN CLINIC NOTE       ASSESSMENT:    1. Cervical spondylotic myelopathy  2. Multiple sclerosis  3. 2 weeks status post C5 corpectomy and C6-7 ACDF for rapid progression of myelopathy    PLAN:    Overall patient is doing exceedingly well. She has noted significant improvement in her strength in her upper extremities. Her neck pain is manageable. She is tapering off her narcotics. Refill was provided. She'll remain in her collar for one additional month. She will return in one month with repeat cervical radiographs. Anticipate discontinuing her cervical collar at that visit and transition to soft collar. Anticipate starting physical therapy.           SUBJECTIVE:  Chief Complaint: Postoperative follow-up status post cervical spine reconstruction    History of Present Illness:   66 y.o. female who returns today for routine postoperative follow-up now 2 weeks status post the above procedure. She had very rapid progression of her myelopathy in the setting of baseline multiple sclerosis. For this reason I admitted her and urgently took her to the operating room for cervical reconstruction. She has done excellent postoperatively. She has had significant improvement in her upper extremity strength and gait. She is very happy with her surgery.      Medications   has a current medication list which includes the following prescription(s): acetaminophen, amlodipine, baclofen, bupropion, carbamazepine, cholecalciferol (vitamin d3), clonazepam, docusate sodium, gabapentin, glatiramer, and omeprazole.   Allergies   Amantadine and Codeine       Review of Systems No chest pain, dyspnea, fevers, chills, nausea     OBJECTIVE:  General Appearance ?? well-nourished and no acute distress ??      Cardiovascular well-perfused distally and no swelling   MUSCULOSKELETAL    Spine ?? Moderate pain with range of motion of the cervical spine    Extremities ?? Instability with tandem gait testing      ??  Motor R L ??      Deltoid 5 5 ??      Bicep 5 5 ??      Tricep 5 5 ??      WE 5 5 ??      Grip 5 5 ??      IO 5 5 ??      IP 5 5 ??      Quad 5 5 ??      TA 4 5 ??      EHL 4 5 ??      GS 5 5 ??            Test Results  Imaging  Cervical spine radiographs obtained today reviewed which demonstrate stable construct without subsidence or screw failure    Images were reviewed with the patient on the PACS monitor. The available reports were reviewed.

## 2017-03-22 NOTE — Unmapped (Signed)
Heather from [place] left vm stating she needs Oxycodone prescription clarification.

## 2017-03-22 NOTE — Unmapped (Signed)
Spoke with pharmacist to clarify acute pain vs chronic.  No other needs at this time.

## 2017-03-26 MED FILL — COPAXONE/40MG/ML/SOSY: COPAXONE/40MG/ML/SOSY | 28 days supply | Qty: 12 | Fill #5

## 2017-04-17 ENCOUNTER — Ambulatory Visit: Admission: RE | Admit: 2017-04-17 | Discharge: 2017-04-17

## 2017-04-17 DIAGNOSIS — H5123 Internuclear ophthalmoplegia, bilateral: Principal | ICD-10-CM

## 2017-04-17 NOTE — Unmapped (Signed)
PT SAID SHE'D BEEN HOSPITALIZED AND STILL HAS 2 1/2 WEEKS OF MEDS - CALL NEXT WEEK

## 2017-04-17 NOTE — Unmapped (Signed)
HPI (08/09/17):  66 y/o New patient with pmhx of MS diagnosed at age 18 (not currently taking meds) and CE/IOL OU performed 2016. Patient states she feels like she can't get both eyes to focus together (left > right). Double vision when reading or computer not at distance nor while driving. Started several years ago but worse in the last 6 months.     Interval Hx 04/17/17: Pt notes worse double vision since she had C-spine surgery and is unable to turn her head dt wearing a c-collar. Collar is hopefully coming off next week. No diplopia it distance in primary gaze.    Impression:  #MS - stable  --MRI 01/2016 Impression: No significant change in the number or distribution of the scattered white matter T2 hyperintense lesions. No enhancing lesions  ---HVF 24-2, SITA Fast: 08/09/16   -OD: good reliability; Diffuse depression stark contrast from 2013, VFI 59%, MD -15   -OS: good reliability; scattered non-specific depressions, No concerning change from 2013  -RNFL 08/09/16   -OD:  Vertical C:D 0.58, average RNFL thickness 94 microns, WNL   -OS:  Vertical C:D 0.49, average RNFL thickness 94 microns, WNL    #Bilateral INO resulting in double vision  -- 2PDXT in 6PD BI glasses  -- will consider changing glasses after the C-collar is off if patient is still symptomatic    RTC 4wks Dr. Theotis Barrio V/T    CF Nargis Abrams

## 2017-04-24 ENCOUNTER — Ambulatory Visit
Admission: RE | Admit: 2017-04-24 | Discharge: 2017-04-24 | Disposition: A | Discharge status: Home / Self Care | Payer: MEDICARE

## 2017-04-24 DIAGNOSIS — Z981 Arthrodesis status: Principal | ICD-10-CM

## 2017-04-24 NOTE — Unmapped (Addendum)
??   Your diagnosis: Cervical Fusion    ?? Recommendations/Plan  ?? Medications: Take over-the-counter medications as needed and as tolerated  ?? If you don't have liver disease, the safest medication for pain is acetaminophen (Tylenol).  Take 1000mg  (2 extra strength tabs) at a time for pain relief.  Do not take more than 3000mg  per day.  ?? Try Salonpas Lidocaine 4% patches ([email or web address]/)  ?? Activities as tolerated.  ?? Avoid impact activities (running, jumping) and heavy machinery Academic librarian, guns, motorcycles, etc.)  ?? We referred you to Physical Therapy  ?? Imaging studies: Cervical AP and lateral views (prior to next visit).  ?? Follow-up: To be scheduled in about 6 week(s).     ?? Avoid nicotine/tobacco  ?? Minimize alcohol intake  ?? Maintain a healthy weight    ?? Stay physically active and exercise regularly as tolerated ([email or web address])  ?? Maintain good sleeping habits    ?? We want to improve, and you can help.  You may receive a survey asking you about your visit.  Please take a few minutes to complete the survey.  We will use your feedback to make improvements.    ?? Contact our team electronically via MyChart message to [facility] Clinical Staff.    ?? Contact our team at [phone] or [phone] with any questions/concerns.    ?? Please visit and like [place] on our Facebook page to learn more about our services.   [email or web address]

## 2017-04-25 ENCOUNTER — Emergency Department
Admission: EM | Admit: 2017-04-25 | Discharge: 2017-04-25 | Disposition: A | Discharge status: Home / Self Care | Payer: No Typology Code available for payment source | Attending: Emergency Medicine | Admitting: Emergency Medicine

## 2017-04-25 ENCOUNTER — Emergency Department: Payer: No Typology Code available for payment source

## 2017-04-25 DIAGNOSIS — F17200 Nicotine dependence, unspecified, uncomplicated: Secondary | ICD-10-CM | POA: Insufficient documentation

## 2017-04-25 DIAGNOSIS — Y92410 Unspecified street and highway as the place of occurrence of the external cause: Secondary | ICD-10-CM | POA: Insufficient documentation

## 2017-04-25 DIAGNOSIS — S161XXA Strain of muscle, fascia and tendon at neck level, initial encounter: Secondary | ICD-10-CM | POA: Diagnosis not present

## 2017-04-25 DIAGNOSIS — S199XXA Unspecified injury of neck, initial encounter: Secondary | ICD-10-CM | POA: Diagnosis present

## 2017-04-25 DIAGNOSIS — Y939 Activity, unspecified: Secondary | ICD-10-CM | POA: Diagnosis not present

## 2017-04-25 DIAGNOSIS — Y999 Unspecified external cause status: Secondary | ICD-10-CM | POA: Diagnosis not present

## 2017-04-25 MED ORDER — OXYCODONE HCL 5 MG PO CAPS: 5.0000 mg | ORAL_CAPSULE | Freq: Four times a day (QID) | ORAL | 0 refills | Status: DC | PRN

## 2017-04-25 MED ORDER — OXYCODONE HCL 5 MG PO TABS
5.0000 mg | ORAL_TABLET | Freq: Once | ORAL | Status: AC
  Administered 2017-04-25: 5 mg via ORAL
  Filled 2017-04-25: qty 1

## 2017-04-25 MED ORDER — COPAXONE 40 MG/ML SUBCUTANEOUS SYRINGE
5 refills | 0 days
Start: 2017-04-25 — End: 2017-04-25

## 2017-04-25 MED ORDER — GLATIRAMER 40 MG/ML SUBCUTANEOUS SYRINGE
INJECTION | SUBCUTANEOUS | 5 refills | 0 days | Status: CP
Start: 2017-04-25 — End: 2017-08-24

## 2017-04-25 NOTE — ED Notes (Signed)
Pt discharged to home.  Family member driving.  Discharge instructions reviewed.  Verbalized understanding.  No questions or concerns at this time.  Teach back verified.  Pt in NAD.  No items left in ED.   
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
## 2017-04-25 NOTE — ED Notes (Signed)
Pt reports she is out of her pain medication at home. Provider made aware.

## 2017-04-25 NOTE — Discharge Instructions (Signed)
Follow-up with your neurosurgeon as soon as able to schedule an appointment with them.   Images taken today do not reveal any acute fractures or disruption of the hardware and the cervical spine.

## 2017-04-25 NOTE — Unmapped (Signed)
Refilled Glatiramer acetate.     Ashlee Christian

## 2017-04-25 NOTE — Unmapped (Signed)
ORTHOPAEDIC SPINE RETURN CLINIC NOTE       ASSESSMENT:    1. Cervical spondylotic myelopathy  2. Multiple sclerosis  3. 6 weeks status post C5 corpectomy and C6-7 ACDF for rapid progression of myelopathy    PLAN:    Overall patient is doing exceedingly well. She has noted significant improvement in her strength in her upper extremities. Her neck pain is improved as well. She may discontinue use a cervical collar. We will initiate physical therapy and a prescription was provided to this effect. I'll see the patient back in 6 weeks with repeat cervical radiographs           SUBJECTIVE:  Chief Complaint: Postoperative follow-up status post cervical spine reconstruction    History of Present Illness:   66 y.o. female who returns today for routine postoperative follow-up now 6 weeks status post the above procedure. She had very rapid progression of her myelopathy in the setting of baseline multiple sclerosis. For this reason I admitted her and urgently took her to the operating room for cervical reconstruction. She has done excellent postoperatively. She has had significant improvement in her upper extremity strength and gait. She is very happy with her surgery.      Medications   has a current medication list which includes the following prescription(s): amlodipine, baclofen, bupropion, carbamazepine, cholecalciferol (vitamin d3), clonazepam, gabapentin, glatiramer, omeprazole, and oxycodone.   Allergies   Amantadine and Codeine       Review of Systems No chest pain, dyspnea, fevers, chills, nausea     OBJECTIVE:  General Appearance ?? well-nourished and no acute distress ??      Cardiovascular well-perfused distally and no swelling   MUSCULOSKELETAL    Spine ?? Moderate pain with range of motion of the cervical spine    Extremities ?? Instability with tandem gait testing      ??  Motor R L ??      Deltoid 5 5 ??      Bicep 5 5 ??      Tricep 5 5 ??      WE 5 5 ??      Grip 5 5 ??      IO 5 5 ??      IP 4 5 ??      Quad 5 5 TA 4+ 5 ??      EHL 4+ 5 ??      GS 5 5 ??            Test Results  Imaging  Cervical spine radiographs obtained today reviewed which demonstrate stable construct without subsidence or screw failure    Images were reviewed with the patient on the PACS monitor. The available reports were reviewed.

## 2017-04-25 NOTE — Unmapped (Signed)
Specialty Pharmacy Refill Coordination Note     Ashlee Christian. Fournier is a 66 y.o. female contacted today regarding refills of her specialty medication(s).    Reviewed and verified with patient:      Specialty medication(s) and dose(s) confirmed: yes  Changes to medications: no  Changes to insurance: no    Medication Adherence    Patient reported X missed doses in the last month:  0  Specialty Medication:  COPAXONE  Patient is on additional specialty medications:  No  Informant:  patient  Confirmed plan for next specialty medication refill:  delivery by pharmacy  Medication Assistance Program  Refill Coordination  Has the Patient's Contact Information Changed:  No  Is the Shipping Address Different:  No  Shipping Information  Delivery Scheduled:  Yes  Delivery Date:  05/01/17  Medications to be Shipped:  COPAXONE          Follow-up: 3 week(s)     Westley Gambles  Specialty Pharmacy Technician

## 2017-04-25 NOTE — Unmapped (Signed)
Can you please put the rx in as Copaxone 40mg  for [facility] pharmacy?

## 2017-04-25 NOTE — Unmapped (Signed)
Refilled, thanks.  Ashlee Christian

## 2017-04-26 ENCOUNTER — Ambulatory Visit: Admit: 2017-04-26 | Discharge: 2017-04-26 | Disposition: A | Discharge status: Home / Self Care | Payer: MEDICARE
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
## 2017-04-26 NOTE — Unmapped (Signed)
It could be, please schedule with Clara.   Thanks,  Sharmon Revere
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
## 2017-04-27 ENCOUNTER — Ambulatory Visit
Admission: RE | Admit: 2017-04-27 | Discharge: 2017-04-27 | Disposition: A | Discharge status: Home / Self Care | Payer: MEDICARE | Attending: Physician Assistant

## 2017-04-27 DIAGNOSIS — G35 Multiple sclerosis: Principal | ICD-10-CM

## 2017-04-27 DIAGNOSIS — M542 Cervicalgia: Secondary | ICD-10-CM

## 2017-04-27 DIAGNOSIS — M791 Myalgia, unspecified site: Secondary | ICD-10-CM
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
## 2017-04-27 NOTE — Unmapped (Addendum)
Increase your Baclofen 20mg  to three times a day.  Apply topical anesthetic cream to your neck and shoulders.  Alternate between hot compress and ice packs for ten minutes every 2 hours    A topical anesthetic is a local anesthetic that is used to numb the surface of a body part. They can be used to numb any area of the skin.     benzocaine topical  Pronunciation:  BENZ oh kane TOP ik al  Brand:  Americaine, Anacaine, Anbesol Gel, Benzodent, Cepacol Ultra, Dent-O-Kain, Dermoplast, Hurricaine, Kank-a, Lanacane, Medicone Maximum Strength, Numzident, Num-Zit, Orabase, Orajel, Oral Pain Relief, Outgro Pain Relief, Retre-Gel, Skeeter Stik, Gen[employer]ine Parts, [place], Topex, zilactin-B.      Phone follow up with Ashlee Christian on 04/30/2017, Monday about noon.      ??  ??   In case of:  ?? a suspected relapse (new symptoms or worsening existing symptoms, lasting for >24h)  OR  ?? a need for an additional appointment for other reasons     Please contact:    [facility] Desk  Phone: [phone]      OR     Ms. Webb Laws  Administrative [facility], Department of Neurology  [address]     [place], [place] [address]     Phone: [phone], Fax: [phone]           Ashlee Christian [facility]    [facility] Neurology /   Multiple Sclerosis Division    [address]    [place], [place] [address]

## 2017-04-28 MED FILL — COPAXONE/40MG/ML/SOSY: COPAXONE/40MG/ML/SOSY | 28 days supply | Qty: 12 | Fill #0

## 2017-04-30 MED ORDER — FAMOTIDINE 20 MG TABLET
ORAL_TABLET | 0 refills | 0 days | Status: CP
Start: 2017-04-30 — End: 2017-05-03

## 2017-04-30 NOTE — Unmapped (Signed)
FYI - I received a call from [place] @ [facility]ake - they have pt set up for her 3 days of IVSM starting tomorrow - nursing is going to be through [facility].      Thanks, [employer]

## 2017-04-30 NOTE — Unmapped (Signed)
Spoke with Ashlee Christian in Intake at [facility] and advised that Dr Johnnye Lana changed the orders to only 3 days and lowered the dose.  She gave info to [facility] who is still working to secure nursing for pt.

## 2017-04-30 NOTE — Unmapped (Signed)
Great, thanks [name]!  Ashlee Christian
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
## 2017-04-30 NOTE — Unmapped (Signed)
Called the patient, she feels the same as on Friday. Has leg weakness and pain.    Given her age and hypertension, I would suggest for the patient to receive 3 days of IVMP 500mg /day.     She already  has Omeprazole pills at home and voiced understanding that she needs to use them during steroid treatment.   I entered the Home heath order for 3 days of IVMP 500mg /day .    Sharmon Revere Dujmovic Basuroski
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
## 2017-05-03 NOTE — Unmapped (Signed)
Received a call from [employer] with [facility].  Nurse went out to start IV on Tuesday and provide teaching for patient.  She completed her infusion but was adamant that the IV was too painful and wanted the IV removed. Nurse did not have any issues starting IV.  Wed different nurse went out to start IV, again had no problem starting it.  Patient said it was too painful and wanted it removed.  Did not get infusion.  The first nurse went back out Wed evening and started IV with no problem but patinet wanted IV removed before completed.      Crystal is asking what you would like them to do.  They have used up the supplies sent out but since the patient is having a difficult time with the IV is not sure that another attempt would help.    Crystal's cell is [phone]

## 2017-05-08 ENCOUNTER — Ambulatory Visit
Admission: RE | Admit: 2017-05-08 | Discharge: 2017-05-08 | Disposition: A | Discharge status: Home / Self Care | Payer: MEDICARE | Admitting: Physician Assistant

## 2017-05-08 ENCOUNTER — Ambulatory Visit
Admission: RE | Admit: 2017-05-08 | Discharge: 2017-05-08 | Disposition: A | Discharge status: Home / Self Care | Payer: MEDICARE

## 2017-05-08 DIAGNOSIS — G35 Multiple sclerosis: Principal | ICD-10-CM

## 2017-05-08 DIAGNOSIS — Z981 Arthrodesis status: Secondary | ICD-10-CM

## 2017-05-08 DIAGNOSIS — R531 Weakness: Secondary | ICD-10-CM

## 2017-05-08 MED ORDER — PREDNISONE 10 MG TABLET
ORAL_TABLET | 0 refills | 0 days | Status: CP
Start: 2017-05-08 — End: 2017-06-20
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
## 2017-05-08 NOTE — Unmapped (Signed)
POST SEDATION GUIDELINES/DISCHARGE INSTRUCTIONS    The medicines given to you today for your procedure will stay in your body for some time. You may feel dizzy or lose your sense of balance. The use of your muscles may be changed. Your judgment may be affected. Your reaction time, for example, when driving a car, will be slower. You may not see any of these changes in yourself. In general, you should completely recover from these medicines by tomorrow. For your own safety, we have some strict rules for you to follow for the next 12 hours.    The 7D???s  Do not DRIVE.  Do not use appliances or equipment that could be DANGEROUS, like power tools, stove, burners, lawnmowers, garbage disposals.   Watch out for DIZZINESS. Walk slowly and take your time. Sudden changes of position can also cause nausea.   Do not make any important DECISIONS. You may change your mind tomorrow.   Do not DRINK alcoholic beverages. The drugs in your body may cause your reaction to alcohol to be dangerous.   DIET: If you feel nauseated or ???sick to your stomach???, drink clear liquids like 7-Up, broth, apple juice, ginger ale, tea, and cola or eat jello. If these liquids do not make you ???sick to your stomach???, try eating soft foods like potatoes, rice, pasta and cereal. Tomorrow you can eat regular foods.  DISCUSS any questions you may have with your primary care doctor or the doctor who ordered your test today.

## 2017-05-08 NOTE — Unmapped (Signed)
Prednisone 10mg :  Four tablets (40mg ) for 4 days, then two tablets (20mg  ) for 4 days, then one tablet (10mg ) for 4 days then stop.      ??  ??   In case of:  ?? a suspected relapse (new symptoms or worsening existing symptoms, lasting for >24h)  OR  ?? a need for an additional appointment for other reasons     Please contact:    [facility] Neurology [facility] Desk  Phone: [phone]      OR     Ms. Webb Laws  Administrative [facility], Department of Neurology  [address], [address]     [place], [place] [address]     Phone: [phone], Fax: [phone]           [facility]    [facility] Neurology /   Multiple Sclerosis Division    [address]    [place], [place] [address]

## 2017-05-08 NOTE — Unmapped (Signed)
Erroneous error
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
## 2017-05-29 MED FILL — COPAXONE/40MG/ML/SOSY: COPAXONE/40MG/ML/SOSY | 28 days supply | Qty: 12 | Fill #1

## 2017-06-05 ENCOUNTER — Ambulatory Visit
Admission: RE | Admit: 2017-06-05 | Discharge: 2017-06-05 | Disposition: A | Discharge status: Home / Self Care | Payer: MEDICARE

## 2017-06-05 DIAGNOSIS — Z981 Arthrodesis status: Principal | ICD-10-CM

## 2017-06-05 DIAGNOSIS — G35 Multiple sclerosis: Secondary | ICD-10-CM

## 2017-06-05 DIAGNOSIS — R2681 Unsteadiness on feet: Secondary | ICD-10-CM

## 2017-06-05 NOTE — Unmapped (Signed)
??   Your diagnosis: Cervical myelopathy    ?? Recommendations/Plan  ?? Medications: Take over-the-counter medications as needed and as tolerated  ?? If you don't have liver disease, the safest medication for pain is acetaminophen (Tylenol).  Take 1000mg  (2 extra strength tabs) at a time for pain relief.  Do not take more than 3000mg  per day.  ?? Try Salonpas Lidocaine 4% patches (https://www.cook.com/)  ?? Activities as tolerated.  ?? Monitor your symptoms. Be mindful of the warning symptoms we discussed.  ?? Imaging studies: MRI of the Cervical, Thoracic and Lumbosacral spine was ordered. Medical necessity: Radiographs have been performed/ordered. Presence of red flag findings as noted on our evaluation. We suspect cord compression vs MS flare.  ?? Follow-up: After imaging studies.     ?? Avoid nicotine/tobacco  ?? Minimize alcohol intake  ?? Maintain a healthy weight    ?? Stay physically active and exercise regularly as tolerated ([email or web address])  ?? Maintain good sleeping habits    ?? We want to improve, and you can help.  You may receive a survey asking you about your visit.  Please take a few minutes to complete the survey.  We will use your feedback to make improvements.    ?? Contact our team electronically via MyChart message to [facility] Clinical Staff.    ?? Contact our team at [phone] or [phone] with any questions/concerns.    ?? Please visit and like [place] on our Facebook page to learn more about our services.   [email or web address]
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
## 2017-06-07 ENCOUNTER — Ambulatory Visit: Admission: RE | Admit: 2017-06-07 | Discharge: 2017-06-07 | Payer: MEDICARE

## 2017-06-07 DIAGNOSIS — H532 Diplopia: Principal | ICD-10-CM

## 2017-06-11 ENCOUNTER — Ambulatory Visit
Admission: RE | Admit: 2017-06-11 | Discharge: 2017-06-11 | Disposition: A | Discharge status: Home / Self Care | Payer: MEDICARE

## 2017-06-11 DIAGNOSIS — R2681 Unsteadiness on feet: Secondary | ICD-10-CM

## 2017-06-11 DIAGNOSIS — M4802 Spinal stenosis, cervical region: Secondary | ICD-10-CM

## 2017-06-11 DIAGNOSIS — G35 Multiple sclerosis: Secondary | ICD-10-CM

## 2017-06-11 DIAGNOSIS — Z981 Arthrodesis status: Principal | ICD-10-CM

## 2017-06-20 ENCOUNTER — Ambulatory Visit
Admission: RE | Admit: 2017-06-20 | Discharge: 2017-06-20 | Disposition: A | Discharge status: Home / Self Care | Payer: MEDICARE | Attending: Neurology | Admitting: Neurology

## 2017-06-20 DIAGNOSIS — G35 Multiple sclerosis: Principal | ICD-10-CM

## 2017-06-20 DIAGNOSIS — Z981 Arthrodesis status: Secondary | ICD-10-CM

## 2017-06-20 DIAGNOSIS — M792 Neuralgia and neuritis, unspecified: Secondary | ICD-10-CM

## 2017-06-20 DIAGNOSIS — F329 Major depressive disorder, single episode, unspecified: Secondary | ICD-10-CM

## 2017-06-20 DIAGNOSIS — I10 Essential (primary) hypertension: Secondary | ICD-10-CM

## 2017-06-20 LAB — CBC W/ AUTO DIFF
EOSINOPHILS ABSOLUTE COUNT: 0.1 10*9/L (ref 0.0–0.4)
HEMATOCRIT: 39.9 % (ref 36.0–46.0)
LARGE UNSTAINED CELLS: 1 % (ref 0–4)
LYMPHOCYTES ABSOLUTE COUNT: 1.1 10*9/L — ABNORMAL LOW (ref 1.5–5.0)
MEAN CORPUSCULAR HEMOGLOBIN CONC: 33.5 g/dL (ref 31.0–37.0)
MEAN CORPUSCULAR HEMOGLOBIN: 31.8 pg (ref 26.0–34.0)
MEAN CORPUSCULAR VOLUME: 94.8 fL (ref 80.0–100.0)
MONOCYTES ABSOLUTE COUNT: 0.3 10*9/L (ref 0.2–0.8)
PLATELET COUNT: 331 10*9/L (ref 150–440)
RED BLOOD CELL COUNT: 4.21 10*12/L (ref 4.00–5.20)
RED CELL DISTRIBUTION WIDTH: 12.8 % (ref 12.0–15.0)
WBC ADJUSTED: 5.2 10*9/L (ref 4.5–11.0)

## 2017-06-20 LAB — HEPATIC FUNCTION PANEL
AST (SGOT): 29 U/L (ref 14–38)
BILIRUBIN DIRECT: <0.1 mg/dL (ref 0.00–0.40)
BILIRUBIN TOTAL: 0.3 mg/dL (ref 0.0–1.2)
PROTEIN TOTAL: 7.3 g/dL (ref 6.5–8.3)

## 2017-06-20 LAB — AST (SGOT): Aspartate aminotransferase:CCnc:Pt:Ser/Plas:Qn:: 29

## 2017-06-20 LAB — PLATELET COUNT: Lab: 331

## 2017-06-20 MED ORDER — CARBAMAZEPINE ER 200 MG CAPSULE,EXTENDED RELEASE MPHASE12HR
ORAL_CAPSULE | 2 refills | 0 days | Status: CP
Start: 2017-06-20 — End: 2017-09-14

## 2017-06-20 MED ORDER — GABAPENTIN 300 MG CAPSULE
ORAL_CAPSULE | Freq: Three times a day (TID) | ORAL | 2 refills | 0.00000 days | Status: CP
Start: 2017-06-20 — End: 2017-07-15

## 2017-06-20 NOTE — Unmapped (Addendum)
Plan to decrease Neurontin (gabapentin) to: 300 mg three times a day.    WE INCREASED CARBAMAZEPINE TO 400 MG IN THE MORNING, 200 MG AT LUNCHTIME AND 400 MG IN THE EVENING.   Please schedule a visit with me in 3 months, or earlier, if needed.     ?   In case of:  ? a suspected relapse (new symptoms or worsening existing symptoms, lasting for >24h)  OR  ? a need for an additional appointment for other reasons      Please contact:    [facility]    Phone: [phone]        Sarita Bottom, MD  Clinical Associate Professor of Neurology  [facility], Department of Neurology  Multiple Sclerosis/Neuroimmunology Division  [address] [place], [place], [place] [address]

## 2017-06-20 NOTE — Unmapped (Signed)
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  VII- discrete right-sided central facial droop.  VIII- Hearing grossly intact to conversation.   IX and X- symmetric palate contraction, normal gag bilaterally, mild dysarthria, bradylalia, no dysphagia.  XI- Full shoulder shrug bilaterally; no wasting, normal tone and strength of sternocleidomastoid muscles bilaterally.  XII- No tongue atrophy, no tongue fasciculations; tongue protrudes midline, full range of movements of the tongue.    Neck flexion normal, reduced neck range of motion on  rotation b/l. Paraspinal cervical muscle spasm. Bilaterally mildly painful  greater occipital nerve trigger points.     Motor Exam:     Mild distal hand interosseal muscle atrophy. Fasciculations not observed.     Muscle strength:    Muscles UEs  LEs    R L  R L   Deltoids 4-/5 4/5 Hip flexors  4-/5 5-/5   Biceps 5/5 5/5 Hip extensors 5/5 5/5   Triceps 4/5 4/5 Knee flexors 5-/5  5-/5   Hand grip 5-/5 5/5 Knee extensors 5-/5 5-/5   Wrist flexors 4+/5 5/5 Foot dorsal flexors 5-/5 5-/5   Wrist extensors 5/5 5/5 Foot plantar flexors 5/5 5/5   Finger flexors 5-/5 5-/5      Finger extensors 5-/5 5-/5           Reflexes R L   Biceps +2 +2   Brachioradialis  +2 +2   Triceps +2 +2   Patella +2 +2   Achilles +1 +1     Normal tone b/l. Negative Babinski sign bilaterally.    Sensory system:  ? Superficial light touch sensation:WNL. No sensory level.   ? Vibration sense: lost up to ASIS right, decreased in all other limbs  ? Position sense: WNL.   Pinprick test for pain sensation: WNL.  (WNL= within normal limits; UE= upper extremities; LE= lower extremities; R= right, L= left).    Cerebellar/Coordination:  Finger-to-nose test: WNL. Bilateral LE mild ataxia. Mild to moderate gait ataxia. Romberg positive. Can not perform a tandem gait.    Gait: Paraparetic and ataxic, possible without assistance, needs unilateral assistance for longer distances.     Tests for meningeal irritation: negative.    EDSS score = 6.0  .........................................................................................................................................[name]    VISIT SUMMARY:  Ashlee Christian, a 66 y.o. Caucasian right handed female  presented for the follow-up of [name] would like to continue a follow up with me, which is planned   in 3 months, or earlier, if needed. Ashlee Christian voiced a complete understanding of the diagnostic and treatment plan as detailed above.   Start of Visit Time: 11:22h   End of Visit Time: 12:04h  Total visit time =  42 minutes    Greater than 50% of the face to face time was spent in consultation on the  disease process, and treatment planning, medication, dosing and side effects.     Thank you for the opportunity to contribute to the care of Ashlee Christian.

## 2017-06-21 LAB — VITAMIN D, TOTAL (25OH): Lab: 29.7

## 2017-06-22 MED ORDER — PREDNISONE 20 MG TABLET
ORAL_TABLET | ORAL | 0 refills | 0.00000 days | Status: CP
Start: 2017-06-22 — End: 2017-07-13

## 2017-06-22 NOTE — Unmapped (Signed)
Patient called to say that she has been having increasing symptoms of numbness, weakness and balance problems and forgetting things.  Say you on Wednesday - had an episode of numbness like pins and needles in her arm prior to seeing it but it resolved before she saw you and she forgot to mention.   After seeing you, she had an episode of numbness in her mouth.  The next day she was dressing, sitting on the side of her bed and slid off to the floor.  She had no balance and her arms from her shoulder to hand were affected.  Son had to help her up.  She finished getting dressed and went a couple doors down.  She fell on her way back and symptoms have been worse ever since.  She is still having the same symptoms and feels they are now pretty severe.  She is not sure if she tried the extra carbatrol - she doesn't remember.      Phone [phone]

## 2017-06-22 NOTE — Unmapped (Signed)
Called the patient. She reported balance problem and tingling to start after she left my office, but before she increased Carbamazepine.     I RX prednisone oral, will follow-up on Monday and consider IV. The patient is already taking Prilosec.  Ashlee Christian

## 2017-06-22 NOTE — Unmapped (Signed)
[facility] Refill Coordination Note  Specialty Medication(s): COPAXONE      VIRIGINIA AMENDOLA, DOB: 11-23-1950  Phone: [phone] (home) [phone] (work), Alternate phone contact: N/A  Phone or address changes today?: No  All above HIPAA information was verified with patient.  Shipping Address: [address]  [place] [address]   Insurance changes? No    Completed refill call assessment today to schedule patient's medication shipment from the [facility] [phone]).      Confirmed the medication and dosage are correct and have not changed: Yes, regimen is correct and unchanged.    Confirmed patient started or stopped the following medications in the past month:  No, there are no changes reported at this time.    Are you tolerating your medication?:  Deseray reports tolerating the medication.    ADHERENCE    (Below is required for Medicare Part B or Transplant patients only - per drug):   How many tablets were dispensed last month: 12  Patient currently has MAYBE 3 OR 4? PATIENT IS NOT AT HOME remaining.    Did you miss any doses in the past 4 weeks? No missed doses reported.    FINANCIAL/SHIPPING    Delivery Scheduled: Yes, Expected medication delivery date: 06/27/17     Davanee did not have any additional questions at this time.    Delivery address validated in [facility] scheduling system: Yes, address listed in [facility] is correct.    We will follow up with patient monthly for standard refill processing and delivery.      Thank you,  Westley Gambles   [facility] Specialty Technician

## 2017-06-26 MED FILL — COPAXONE/40MG/ML/SOSY: COPAXONE/40MG/ML/SOSY | 28 days supply | Qty: 12 | Fill #2

## 2017-06-27 ENCOUNTER — Ambulatory Visit: Admission: RE | Admit: 2017-06-27 | Discharge: 2017-06-27 | Disposition: A | Discharge status: Home / Self Care

## 2017-06-27 DIAGNOSIS — Z981 Arthrodesis status: Principal | ICD-10-CM

## 2017-06-27 NOTE — Unmapped (Signed)
ORTHOPAEDIC SPINE RETURN CLINIC NOTE       ASSESSMENT:    1. 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 Multiple sclerosis with current flare    PLAN:    We discussed clinical and radiographic features. Symptoms are consistent with an MS flare. Clouded thinking, facial numbness, and weakness/numbness/tingling in multiple extremities improved with prednisone point strongly to an MS flare as the cause of her weakness. Full spine MRI is reassuring as below.    Follow up with neurology.    Follow up with [place] in 6 months.    New AP and lateral cervical x-rays at that time.           SUBJECTIVE:  Chief Complaint: Postoperative follow-up status post cervical spine reconstruction    History of Present Illness:   66 y.o. female returns for follow-up after full spine MRI. She is had continued weakness, particularly in the right arm with numbness and tingling in the whole arm last week. She also had facial numbness which has persisted. Bilateral lower extremity weakness and gait instability causing multiple falls and requiring assistance. She was seen by neurology recently and was prescribed prednisone Dosepak for presumed MS flare which is resulted in some improvement. She also reports her thinking is somewhat clouded.      Medications   has a current medication list which includes the following prescription(s): amlodipine, baclofen, bupropion, carbamazepine, cholecalciferol (vitamin d3), clonazepam, gabapentin, glatiramer, omeprazole, and prednisone.   Allergies   Amantadine and Codeine       Review of Systems No chest pain, dyspnea, fevers, chills, nausea     OBJECTIVE:  General Appearance ?? well-nourished and no acute distress ??      Cardiovascular well-perfused distally and no swelling   MUSCULOSKELETAL    Spine ?? Moderate pain with range of motion of the cervical spine    Extremities ?? Instability with tandem gait testing      ??  Motor R L   Deltoid 4 4 Bicep 4 4   Tricep 4 4   WE 4 4   Grip 4 4   IO 4 4   IP 4 4   Quad 4+ 4+   TA 4+ 4+   EHL 4+ 4+   GS 4+ 4+         Test Results  Imaging  Full spine MRI demonstrates good decompression of the cervical spinal cord. No high-grade stenosis and no compression elsewhere and thoracic or lumbar spine. Some residual foraminal disease which does not correlate well with her symptoms.    Images were reviewed with the patient on the PACS monitor. The available reports were reviewed.

## 2017-06-27 NOTE — Unmapped (Addendum)
??   Your diagnosis: Neck pain    ?? Recommendations/Plan  ?? Medications: Take over-the-counter medications as needed and as tolerated  ?? If you don't have liver disease, the safest medication for pain is acetaminophen (Tylenol).  Take 1000mg  (2 extra strength tabs) at a time for pain relief.  Do not take more than 3000mg  per day.  ?? Activities as tolerated.  ?? Recommend progressive/daily walking exercise program  ?? Monitor your symptoms. Be mindful of the warning symptoms we discussed.  ?? You are not a candidate for spinal surgical treatment.  ?? You are exhibiting zero signs of cervical myelopathy, the arm issue is coming for a MS flare.  ?? Imaging studies: Cervical AP and lateral views (prior to next visit).  ?? Follow-up: To be scheduled in about 6 month(s).     ?? Avoid nicotine/tobacco  ?? Minimize alcohol intake  ?? Maintain a healthy weight    ?? Stay physically active and exercise regularly as tolerated ([email or web address])  ?? Maintain good sleeping habits    ?? We want to improve, and you can help.  You may receive a survey asking you about your visit.  Please take a few minutes to complete the survey.  We will use your feedback to make improvements.    ?? Contact our team electronically via MyChart message to [facility] Clinical Staff.    ?? Contact our team at [phone] or [phone] with any questions/concerns.    ?? Please visit and like [place] on our Facebook page to learn more about our services.   [email or web address]

## 2017-07-09 ENCOUNTER — Ambulatory Visit
Admission: RE | Admit: 2017-07-09 | Discharge: 2017-07-09 | Disposition: A | Discharge status: Home / Self Care | Payer: MEDICARE

## 2017-07-09 DIAGNOSIS — G35 Multiple sclerosis: Principal | ICD-10-CM

## 2017-07-09 NOTE — Unmapped (Signed)
Pt here for 1/5 solumedrol c/o weakness and numbness in face and left arm and hand.Call Bell within reach of patient at chairside.  Patient educated on sign and symptoms of reaction, i.e. itching, rash, shortness of breath instructed to call nurse for any discomfort.   Solumedrol 500mg  infuse  At 152ml/hr over one 30-40 mins  1500Call Bell within reach of patient at chairside.  Patient educated on sign and symptoms of reaction, i.e. itching, rash, shortness of breath instructed to call nurse for any discomfort.

## 2017-07-10 ENCOUNTER — Ambulatory Visit
Admission: RE | Admit: 2017-07-10 | Discharge: 2017-07-10 | Disposition: A | Discharge status: Home / Self Care | Payer: MEDICARE

## 2017-07-10 ENCOUNTER — Ambulatory Visit
Admission: RE | Admit: 2017-07-10 | Discharge: 2017-07-10 | Disposition: A | Discharge status: Home / Self Care | Payer: MEDICARE | Attending: Neurology | Admitting: Neurology

## 2017-07-10 DIAGNOSIS — G35 Multiple sclerosis: Principal | ICD-10-CM

## 2017-07-10 DIAGNOSIS — R419 Unspecified symptoms and signs involving cognitive functions and awareness: Secondary | ICD-10-CM

## 2017-07-10 DIAGNOSIS — Z981 Arthrodesis status: Secondary | ICD-10-CM

## 2017-07-10 DIAGNOSIS — F329 Major depressive disorder, single episode, unspecified: Secondary | ICD-10-CM

## 2017-07-10 DIAGNOSIS — I10 Essential (primary) hypertension: Secondary | ICD-10-CM

## 2017-07-10 MED ORDER — OMEPRAZOLE 40 MG CAPSULE,DELAYED RELEASE
ORAL_CAPSULE | Freq: Every day | ORAL | 0 refills | 0 days | Status: CP
Start: 2017-07-10 — End: 2018-04-29

## 2017-07-10 NOTE — Unmapped (Signed)
Pt presents for 2nd Solumedrol infusion.  VSS, IV placed.  Pt aware of potential reaction/side effects, call bell within reach.  1413 Solumedrol 500mg  started, to infuse over per order.  1441 Infusion complete, pt tolerated without complication.  IV flushed per policy.  1450 IV d/c'd, gauze and coban applied.  Pt left clinic in no acute distress, will return tomorrow for another Solumedrol infusion.
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
## 2017-07-10 NOTE — Unmapped (Addendum)
You are going to receive 3 days of IV steroids.     We considered tos witch to tecfidera.    Please schedule with me in 3 months.    ?   In case of:  ? a suspected relapse (new symptoms or worsening existing symptoms, lasting for >24h)  OR  ? a need for an additional appointment for other reasons     Please contact:    [facility]  Phone: [phone]        Sarita Bottom, MD  Clinical Associate Professor of Neurology  [facility], Department of Neurology  Multiple Sclerosis/Neuroimmunology Division  [address] [place], [place], [place] [address]    ....         dimethyl fumarate  Pronunciation:  dye METH il FUE mar ate  Brand:  Tecfidera  What is the most important information I should know about dimethyl fumarate?  Dimethyl fumarate may cause a serious viral infection of the brain that can lead to disability or death. Call your doctor right away if you have any change in your mental state, decreased vision, weakness on one side of your body, or problems with speech or walking. These symptoms may start gradually and get worse quickly.  What is dimethyl fumarate?  Dimethyl fumarate is used to treat relapsing multiple sclerosis.  Dimethyl fumarate may also be used for purposes not listed in this medication guide.  What should I discuss with my healthcare provider before taking dimethyl fumarate?  You should not use dimethyl fumarate if you are allergic to it.  To make sure dimethyl fumarate is safe for you, tell your doctor if you have:  ?? an active infection; or  ?? low white blood cell (WBC) counts.  It is not known whether dimethyl fumarate will harm an unborn baby. Tell your doctor if you are pregnant or plan to become pregnant while using this medicine.  If you are pregnant, your name may be listed on a pregnancy registry. This is to track the outcome of the pregnancy and to evaluate any effects of dimethyl fumarate on the baby.  It is not known whether dimethyl fumarate passes into breast milk or if it could harm a nursing baby. Tell your doctor if you are breast-feeding a baby.  How should I take dimethyl fumarate?  Your doctor will perform blood tests to make sure you do not have conditions that would prevent you from safely using dimethyl fumarate.  Follow all directions on your prescription label. Your doctor may occasionally change your dose. Do not take this medicine in larger or smaller amounts or for longer than recommended.  You may take dimethyl fumarate with or without food. Taking the medicine with food may help prevent flushing (warmth, itching, or burning sensations).  Do not crush, chew, break, or open a dimethyl fumarate capsule. Swallow it whole.  Dimethyl fumarate is usually given in two different strengths, one for a starter dose and the other for a maintenance dose. The starter dose is usually taken for only 7 days. Follow your doctor's dosing instructions very carefully.  While using dimethyl fumarate, you will need frequent blood tests.  Store at room temperature away from moisture, heat, and light. Throw away any unused capsules 90 days after you first opened the bottle.  What happens if I miss a dose?  Take the missed dose as soon as you remember. Skip the missed dose if it is almost time for your next scheduled  dose. Do not take extra medicine to make up the missed dose.  What happens if I overdose?  Seek emergency medical attention or call the Poison Help line at [phone].  What should I avoid while taking dimethyl fumarate?  Follow your doctor's instructions about any restrictions on food, beverages, or activity.  What are the possible side effects of dimethyl fumarate?  Get emergency medical help if you have signs of an allergic reaction: hives; difficult breathing; swelling of your face, lips, tongue, or throat.  Dimethyl fumarate may cause a serious viral infection of the brain that can lead to disability or death. Symptoms may start gradually and get worse quickly. Call your doctor right away if you have:  ?? any change in your mental state;  ?? decreased vision;  ?? weakness on one side of your body; or  ?? problems with speech or walking.  Call your doctor at once if you have:  ?? fever, pain when swallowing, cold or flu symptoms;  ?? severe redness or feelings of warmth, tingling, itching, or burning; or  ?? liver problems --loss of appetite, upper stomach pain (right side), tiredness, dark urine, clay-colored stools, jaundice (yellowing of the skin or eyes).  Common side effects may include:  ?? stomach pain, indigestion;  ?? nausea, vomiting, diarrhea;  ?? rash, itching; or  ?? flushing (warmth, redness, or tingly feeling).  This is not a complete list of side effects and others may occur. Call your doctor for medical advice about side effects. You may report side effects to FDA at 1-800-FDA-1088.  What other drugs will affect dimethyl fumarate?  Other drugs may interact with dimethyl fumarate, including prescription and over-the-counter medicines, vitamins, and herbal products. Tell your doctor about all your current medicines and any medicine you start or stop using.  Where can I get more information?  Your pharmacist can provide more information about dimethyl fumarate.    Remember, keep this and all other medicines out of the reach of children, never share your medicines with others, and use this medication only for the indication prescribed.  Every effort has been made to ensure that the information provided by [employer] ('[employer]') is accurate, up-to-date, and complete, but no guarantee is made to that effect. Drug information contained herein may be time sensitive. Mult[employer] information has been compiled for use by healthcare practitioners and consumers in the [place] and therefore [employer] does not warrant that uses outside of the [place] are appropriate, unless specifically indicated otherwise. Mult[employer]'s drug information does not endorse drugs, diagnose patients or recommend therapy. Mult[employer]'s drug information is an [employer], corporate to assist licensed healthcare practitioners in caring for their patients and/or to serve consumers viewing this service as a supplement to, and not a substitute for, the expertise, skill, knowledge and judgment of healthcare practitioners. The absence of a warning for a given drug or drug combination in no way should be construed to indicate that the drug or drug combination is safe, effective or appropriate for any given patient. Mult[employer] does not assume any responsibility for any aspect of healthcare administered with the aid of information Mult[employer] provides. The information contained herein is not intended to cover all possible uses, directions, precautions, warnings, drug interactions, allergic reactions, or adverse effects. If you have questions about the drugs you are taking, check with your doctor, nurse or pharmacist.  Copyright [phone])526-7034 [employer] Version: 4.01. Revision date: 08/15/2016.  Care instructions adapted under license by [facility] If  you have questions about a medical condition or this instruction, always ask your healthcare professional. [employer] disclaims any warranty or liability for your use of this information.

## 2017-07-11 ENCOUNTER — Ambulatory Visit
Admission: RE | Admit: 2017-07-11 | Discharge: 2017-07-11 | Disposition: A | Discharge status: Home / Self Care | Payer: MEDICARE

## 2017-07-11 DIAGNOSIS — G35 Multiple sclerosis: Principal | ICD-10-CM

## 2017-07-11 NOTE — Unmapped (Signed)
Patient in today for Solumedrol infusion. Patient has no s/s of infection. No issues from the last infusion.  Solumedrol started ,patient instructed to use call bell /call nurse for any s/s of unsual symptoms during infusion. Patient educated on possible s/s of reaction,such as chestpain ,itching,shortness of breath lightheadedness and any kind of discomfort. Patient verbalized understanding.  PIV #24G initiated to right forearm.  Solumedrol 500mg /50 NS started at 1356 and infused. Infusion completed and well tolerated.  Pt alert and oriented with no complaints during infusion. Pt tolerated infusion well.  PIV discontinued. Coban and gauze applied.

## 2017-07-13 MED ORDER — BACLOFEN 20 MG TABLET
ORAL_TABLET | Freq: Three times a day (TID) | ORAL | 5 refills | 0 days | Status: CP
Start: 2017-07-13 — End: 2017-07-15

## 2017-07-15 MED ORDER — GABAPENTIN 300 MG CAPSULE
ORAL_CAPSULE | Freq: Three times a day (TID) | ORAL | 0 refills | 0 days | Status: CP
Start: 2017-07-15 — End: 2017-10-20

## 2017-07-15 MED ORDER — BACLOFEN 20 MG TABLET
ORAL_TABLET | Freq: Three times a day (TID) | ORAL | 1 refills | 0.00000 days | Status: CP
Start: 2017-07-15 — End: 2017-11-14

## 2017-07-16 NOTE — Unmapped (Signed)
Baclofen and Gabapentin refills sent to [facility] for 3 month supply per patient's request.  Ashlee Christian [facility]

## 2017-07-17 NOTE — Unmapped (Signed)
[facility] Refill Coordination Note  Specialty Medication(s): COPAXONE      Ashlee Christian, DOB: [birth year]  Phone: [phone] (home) [phone] (work), Alternate phone contact: N/A  Phone or address changes today?: No  All above HIPAA information was verified with patient.  Shipping Address: [address]  [place] [address]   Insurance changes? No    Completed refill call assessment today to schedule patient's medication shipment from the [facility] [phone]).      Confirmed the medication and dosage are correct and have not changed: Yes, regimen is correct and unchanged.    Confirmed patient started or stopped the following medications in the past month:  No, there are no changes reported at this time.    Are you tolerating your medication?:  Ashlee Christian reports tolerating the medication.    ADHERENCE    (Below is required for Medicare Part B or Transplant patients only - per drug):   How many tablets were dispensed last month: 12  Patient currently has 2 WEEKS remaining.    Did you miss any doses in the past 4 weeks? No missed doses reported.    FINANCIAL/SHIPPING    Delivery Scheduled: Yes, Expected medication delivery date: 07/25/17     Ashlee Christian did not have any additional questions at this time.    Delivery address validated in FSI scheduling system: Yes, address listed in FSI is correct.    We will follow up with patient monthly for standard refill processing and delivery.      Thank you,  Westley Gambles   [facility] Specialty Technician

## 2017-07-24 MED FILL — COPAXONE/40MG/ML/SOSY: COPAXONE/40MG/ML/SOSY | 28 days supply | Qty: 12 | Fill #3

## 2017-07-25 NOTE — Unmapped (Signed)
Ma Hillock Cameron Ali, MD, saw and evaluated the patient, participating in the key elements of the service.  I discussed the findings, assessment and plan with the resident and agree with resident???s findings and plan as documented in the resident's note.

## 2017-08-11 NOTE — Unmapped (Signed)
Attached patient's plan of care to epic for [facility]

## 2017-08-17 NOTE — Unmapped (Signed)
[facility] Refill Coordination Note  Specialty Medication(s): COPAXONE  Additional Medications shipped: NONE    Ashlee Christian, DOB: 12-Jan-1951  Phone: [phone] (home) [phone] (work), Alternate phone contact: N/A  Phone or address changes today?: No  All above HIPAA information was verified with patient.  Shipping Address: [address]  [place] [address]   Insurance changes? No    Completed refill call assessment today to schedule patient's medication shipment from the [facility] [phone]).      Confirmed the medication and dosage are correct and have not changed: Yes, regimen is correct and unchanged.    Confirmed patient started or stopped the following medications in the past month:  No, there are no changes reported at this time.    Are you tolerating your medication?:  Tesia reports tolerating the medication.    ADHERENCE      Did you miss any doses in the past 4 weeks? No missed doses reported.    FINANCIAL/SHIPPING    Delivery Scheduled: Yes, Expected medication delivery date: 08/23/17     Shelagh did not have any additional questions at this time.    Delivery address validated in FSI scheduling system: Yes, address listed in FSI is correct.    We will follow up with patient monthly for standard refill processing and delivery.      Thank you,  Thad Ranger   [facility] Specialty Pharmacist

## 2017-08-24 MED ORDER — DIMETHYL FUMARATE 120 MG (14)-240 MG (46) CAPSULE,DELAYED RELEASE
0 refills | 0.00000 days | Status: CP
Start: 2017-08-24 — End: 2017-08-24

## 2017-08-24 MED ORDER — DIMETHYL FUMARATE 120 MG (14)-240 MG (46) CAPSULE,DELAYED RELEASE: capsule | 0 refills | 0 days | Status: AC

## 2017-08-24 MED ORDER — DIMETHYL FUMARATE 240 MG CAPSULE,DELAYED RELEASE: 240 mg | capsule | Freq: Two times a day (BID) | 1 refills | 0 days | Status: AC

## 2017-08-24 MED ORDER — DIMETHYL FUMARATE 240 MG CAPSULE,DELAYED RELEASE
ORAL_CAPSULE | Freq: Two times a day (BID) | ORAL | 1 refills | 0.00000 days | Status: CP
Start: 2017-08-24 — End: 2017-08-24

## 2017-08-24 NOTE — Unmapped (Signed)
Received signed start form from patient for Tecfidera. Per Dr. Johnnye Lana patient is cleared to start. Will send rx for starter pack and maintenance dose.    Worthy Flank, PharmD, CPP  Clinical Pharmacist, [facility]  Phone: [phone]

## 2017-08-27 MED ORDER — GLATIRAMER 40 MG/ML SUBCUTANEOUS SYRINGE
INJECTION | SUBCUTANEOUS | 2 refills | 0.00000 days | Status: CP
Start: 2017-08-27 — End: 2018-09-19

## 2017-08-27 MED ORDER — GLATIRAMER 40 MG/ML SUBCUTANEOUS SYRINGE: mL | 2 refills | 0 days

## 2017-08-28 NOTE — Unmapped (Signed)
Copaxone did not get sent out. Patient is switching to Tecfidera. Onboarding is needed, once grant is effective.

## 2017-08-28 NOTE — Unmapped (Signed)
Received message from the pharmacy that patient would like to discuss potential side effects with me regarding Tecfidera. She received a call from a manufacturer rep that scared her. 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    Advised the risk of PML with Tecfidera is very small and that there have only been 5 people that have had this while the drug has been on the market. This has been due to patients with an extremely low white blood cell count that were not taken off the drug as indicated by the prescribing guidelines. Advised that we strictly adhere to safety monitoring lab work and would switch her off of the drug if this occurs. Additionally, discussed allergic reactions and that she has the same possibility of being allergic to Tecfidera as she does to any other prescription drug. Discussed that angioedema is serious and she would in fact need to seek medical attention right away but there is no way to know if she is allergic to Tecfidera to this degree, if at all. She does not have a strong history of allergic reactions and those listed on her profile are actually an intolerance - not a true allergy.    She states the representative did not explain this information to her and she now feels more comfortable starting on Tecfidera. Will inform the pharmacy. We are still waiting to hear back from [employer] regarding her grant so the earliest we could fill her Tecfidera would be Friday 08/31/2017.    Will call her to onboard once we have grant approval.    Worthy Flank, PharmD, CPP  Clinical Pharmacist, [facility]  Phone: [phone]

## 2017-08-28 NOTE — Unmapped (Signed)
The patient sent a MyChart message that she changed her mind and does not want to switch to tecfidera.     I suggested to schedule a visit with Worthy Flank.    Will continue on Copacone for now.    Sharmon Revere Dujmovic Basuroski

## 2017-08-29 NOTE — Unmapped (Signed)
Thank you. Please note that I have discontinued Tecfidera Rx after getting her previous message.  Sharmon Revere

## 2017-08-29 NOTE — Unmapped (Signed)
Addended by: Jeanice Lim on: 08/29/2017 08:46 AM     Modules accepted: Orders

## 2017-08-30 NOTE — Unmapped (Signed)
Patient has been approved for financial assistance for Tecfidera from 08/21/17 to 08/20/18

## 2017-08-30 NOTE — Unmapped (Signed)
[facility] Neurology  Patient Onboarding/Medication Counseling    Ms.Paster is a 67 y.o. female with MS who I am counseling today on initiation of therapy.    Medication: Tecfidera    Verified patient's date of birth / HIPAA.      Education Provided: ??    Dose/Administration discussed: 120 mg BID x 7 days, then 240 mg BID thereafter. This medication should be taken  with food.     Storage requirements: this medicine should be stored at room temperature.     Side effects discussed: Discussed common side effects, including flushing and upset stomach/diarrhea. If patient experiences these or allergic reaction, they need to call the doctor.  Patient will receive a Lexi-Comp drug information handout with shipment.    Handling precautions reviewed:  n/a.    Drug Interactions: other medications reviewed and up to date in Epic.  No drug interactions identified.    Comorbidities/Allergies: reviewed and up to date in Epic.    Verified therapy is appropriate and should continue      Delivery Information    Anticipated copay of $0 reviewed with patient. Verified delivery address in FSI and reviewed medication storage requirement.    Scheduled delivery date: 09/04/2017    Explained that we ship using UPS and this shipment will not require a signature.      Explained the services we provide at [facility] and that each month we would call to set up refills.  Stressed importance of returning phone calls so that we could ensure they receive their medications in time each month.  Informed patient that we should be setting up refills 7-10 days prior to when they will run out of medication.  Informed patient that welcome packet will be sent.      Patient verbalized understanding of the above information as well as how to contact the pharmacy at [phone] option 4 with any questions/concerns.        Patient Specific Needs      ? Patient has no physical or cognitive barriers.    ? Patient prefers to have medications discussed with  Patient     ? Patient is able to read and understand education materials at a high school level or above.      Worthy Flank, PharmD, CPP  Clinical Pharmacist, [facility]  Phone: [phone]

## 2017-09-02 MED FILL — TECFIDERA STARTER PACK/STARTER/MISC: TECFIDERA STARTER PACK/STARTER/MISC | 30 days supply | Qty: 60 | Fill #0

## 2017-09-14 NOTE — Unmapped (Addendum)
RosemaryBurnett Harry from [employer] called to obtain a script for a refill for [name], MR# [id number], for Carbamazepine 200 MG 12 hr. capsule, Take 400 mg in the morning, 200 mg at lunchtime and 400 mg in the evening.  Ashlee Christian is out of medication, and Burnett Harry is asking that we send a short fill for 14-21 days to [name]???s local pharmacy, [facility] # [id number], Phone:  [phone], and also a 90 day supply to [facility] [facility].  Burnett Harry can be reached at Phone:  [phone].  Please advise.

## 2017-09-15 MED ORDER — CARBAMAZEPINE ER 200 MG CAPSULE,EXTENDED RELEASE MPHASE12HR
ORAL_CAPSULE | 2 refills | 0 days | Status: CP
Start: 2017-09-15 — End: 2017-09-18

## 2017-09-19 MED ORDER — CARBAMAZEPINE ER 200 MG CAPSULE,EXTENDED RELEASE MPHASE12HR
ORAL_CAPSULE | 1 refills | 0 days | Status: CP
Start: 2017-09-19 — End: 2017-09-20

## 2017-09-20 MED ORDER — CARBAMAZEPINE ER 200 MG CAPSULE,EXTENDED RELEASE MPHASE12HR
ORAL_CAPSULE | 1 refills | 0 days | Status: CP
Start: 2017-09-20 — End: 2017-09-26

## 2017-09-20 NOTE — Unmapped (Signed)
[facility] Pharm[facility] Refill Coordination Note  Specialty Medication(s): Tecfidera 240mg   Additional Medications shipped: none    Ashlee Christian, DOB: December 10, 1950  Phone: [phone] (home) [phone] (work), Alternate phone contact: N/A  Phone or address changes today?: No  All above HIPAA information was verified with patient.  Shipping Address: [address]  [place] [address]   Insurance changes? No    Completed refill call assessment today to schedule patient's medication shipment from the [facility] [phone]).      Confirmed the medication and dosage are correct and have not changed: Yes, regimen is correct and unchanged.    Confirmed patient started or stopped the following medications in the past month:  No, there are no changes reported at this time.    Are you tolerating your medication?:  Ashlee Christian reports tolerating the medication.    ADHERENCE    Did you miss any doses in the past 4 weeks? No missed doses reported.    FINANCIAL/SHIPPING    Delivery Scheduled: Yes, Expected medication delivery date: 09/26/17     Ashlee Christian did not have any additional questions at this time.    Delivery address validated in FSI scheduling system: Yes, address listed in FSI is correct.    We will follow up with patient monthly for standard refill processing and delivery.      Thank you,  Ashlee Christian   [facility] Specialty Pharmacist

## 2017-09-21 NOTE — Unmapped (Signed)
Refilled Carbamazepine to [facility], per patient's request.   Desma Mcgregor [facility]

## 2017-09-25 MED FILL — TECFIDERA/240MG/CPDR: TECFIDERA/240MG/CPDR | 30 days supply | Qty: 60 | Fill #0

## 2017-09-26 MED ORDER — MONTELUKAST 10 MG TABLET
ORAL_TABLET | Freq: Every day | ORAL | 5 refills | 0 days | Status: CP
Start: 2017-09-26 — End: 2018-01-18

## 2017-09-26 MED ORDER — CARBAMAZEPINE ER 200 MG CAPSULE,EXTENDED RELEASE MPHASE12HR
ORAL_CAPSULE | 0 refills | 0 days | Status: CP
Start: 2017-09-26 — End: 2018-03-06

## 2017-09-26 NOTE — Unmapped (Signed)
[facility] is requesting a 90 day prescription for carbamazepine ER 200mg  capsules, directions to take 400mg  in the morning and 400mg  at night.

## 2017-09-27 NOTE — Unmapped (Signed)
Rx singulair for the GI side effect management of Tecfidera.    Sharmon Revere Dujmovic Basuroski

## 2017-10-08 ENCOUNTER — Ambulatory Visit: Admit: 2017-10-08 | Discharge: 2017-10-09 | Payer: MEDICARE

## 2017-10-08 DIAGNOSIS — H532 Diplopia: Secondary | ICD-10-CM

## 2017-10-08 DIAGNOSIS — H5123 Internuclear ophthalmoplegia, bilateral: Secondary | ICD-10-CM

## 2017-10-08 DIAGNOSIS — G35 Multiple sclerosis: Principal | ICD-10-CM
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
## 2017-10-10 NOTE — Unmapped (Signed)
Addendum  created 10/10/17 0551 by Earney Mallet, MD    Cosign clinical note

## 2017-10-11 ENCOUNTER — Ambulatory Visit: Admit: 2017-10-11 | Discharge: 2017-10-12 | Payer: MEDICARE | Attending: Neurology | Primary: Neurology

## 2017-10-11 DIAGNOSIS — R419 Unspecified symptoms and signs involving cognitive functions and awareness: Secondary | ICD-10-CM

## 2017-10-11 DIAGNOSIS — R3911 Hesitancy of micturition: Secondary | ICD-10-CM

## 2017-10-11 DIAGNOSIS — M4802 Spinal stenosis, cervical region: Secondary | ICD-10-CM

## 2017-10-11 DIAGNOSIS — F419 Anxiety disorder, unspecified: Secondary | ICD-10-CM

## 2017-10-11 DIAGNOSIS — F329 Major depressive disorder, single episode, unspecified: Secondary | ICD-10-CM

## 2017-10-11 DIAGNOSIS — Z981 Arthrodesis status: Secondary | ICD-10-CM

## 2017-10-11 DIAGNOSIS — G35 Multiple sclerosis: Principal | ICD-10-CM

## 2017-10-11 DIAGNOSIS — M792 Neuralgia and neuritis, unspecified: Secondary | ICD-10-CM

## 2017-10-11 LAB — CBC W/ AUTO DIFF
BASOPHILS ABSOLUTE COUNT: 0 10*9/L (ref 0.0–0.1)
EOSINOPHILS ABSOLUTE COUNT: 0.1 10*9/L (ref 0.0–0.4)
EOSINOPHILS RELATIVE PERCENT: 1.8 %
HEMATOCRIT: 37.5 % (ref 36.0–46.0)
LARGE UNSTAINED CELLS: 2 % (ref 0–4)
LYMPHOCYTES ABSOLUTE COUNT: 1.3 10*9/L — ABNORMAL LOW (ref 1.5–5.0)
LYMPHOCYTES RELATIVE PERCENT: 22 %
MEAN CORPUSCULAR HEMOGLOBIN CONC: 33.5 g/dL (ref 31.0–37.0)
MEAN CORPUSCULAR HEMOGLOBIN: 31.3 pg (ref 26.0–34.0)
MEAN CORPUSCULAR VOLUME: 93.6 fL (ref 80.0–100.0)
MEAN PLATELET VOLUME: 7.9 fL (ref 7.0–10.0)
MONOCYTES ABSOLUTE COUNT: 0.4 10*9/L (ref 0.2–0.8)
MONOCYTES RELATIVE PERCENT: 6.5 %
NEUTROPHILS ABSOLUTE COUNT: 4 10*9/L (ref 2.0–7.5)
NEUTROPHILS RELATIVE PERCENT: 67.4 %
PLATELET COUNT: 345 10*9/L (ref 150–440)
RED BLOOD CELL COUNT: 4.01 10*12/L (ref 4.00–5.20)
RED CELL DISTRIBUTION WIDTH: 13 % (ref 12.0–15.0)
WBC ADJUSTED: 5.9 10*9/L (ref 4.5–11.0)

## 2017-10-11 LAB — HEPATIC FUNCTION PANEL
ALBUMIN: 4.4 g/dL (ref 3.5–5.0)
ALT (SGPT): 35 U/L (ref 15–48)
AST (SGOT): 20 U/L (ref 14–38)
BILIRUBIN TOTAL: 0.3 mg/dL (ref 0.0–1.2)
PROTEIN TOTAL: 6.3 g/dL — ABNORMAL LOW (ref 6.5–8.3)

## 2017-10-11 LAB — ALT (SGPT): Alanine aminotransferase:CCnc:Pt:Ser/Plas:Qn:: 35

## 2017-10-11 LAB — MEAN CORPUSCULAR HEMOGLOBIN: Lab: 31.3

## 2017-10-11 MED ORDER — DONEPEZIL 5 MG TABLET
ORAL_TABLET | Freq: Every morning | ORAL | 2 refills | 0.00000 days | Status: CP
Start: 2017-10-11 — End: 2018-03-11

## 2017-10-11 NOTE — Unmapped (Addendum)
1) Continue tecfidera 240 mg two times a day  2) Continue carbamazepine  200 mg in the morning -200 mg in the afternoon -400 mg in the evening  3) Continue gabapentin 300 mg three (3) times a day.  4) Continue Klonazepam: 1mg  in the mornig and 1 mg at night  5) We started you on a pill  to try to improve cognitive problems: Aricept : take 5 mg in the morning  6) Continue Vitamin D 2000 units/day  7) we will plan brain and C/T spine MRI to be done in June 2019.   8) Schedule for the [facility] on 03/28/2018 at 8 am/
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
## 2017-10-16 NOTE — Unmapped (Signed)
[facility] Refill Coordination Note  Specialty Medication(s): TECFIDERA 240MG   Additional Medications shipped: NONE    Ashlee Christian, DOB: [birth year]  Phone: [phone] (home) [phone] (work), Alternate phone contact: N/A  Phone or address changes today?: No  All above HIPAA information was verified with patient.  Shipping Address: [address]  [place] [address]   Insurance changes? No    Completed refill call assessment today to schedule patient's medication shipment from the [facility] [phone]).      Confirmed the medication and dosage are correct and have not changed: Yes, regimen is correct and unchanged.    Confirmed patient started or stopped the following medications in the past month:  Yes. Ashlee Christian reports starting the following medications: DONEPEZIL HCL 5 MG    Are you tolerating your medication?:  Ashlee Christian reports tolerating the medication.    ADHERENCE        Did you miss any doses in the past 4 weeks? No missed doses reported.    FINANCIAL/SHIPPING    Delivery Scheduled: Yes, Expected medication delivery date: 10/24/17     The patient will receive an [facility] print out for each medication shipped and additional FDA Medication Guides as required.  Patient education from Ashlee Christian or Ashlee Christian may also be included in the shipment    Ashlee Christian did not have any additional questions at this time.    Delivery address validated in FSI scheduling system: Yes, address listed in FSI is correct.    We will follow up with patient monthly for standard refill processing and delivery.      Thank you,  Ashlee Christian   [facility] Specialty Technician

## 2017-10-20 MED ORDER — GABAPENTIN 300 MG CAPSULE
ORAL_CAPSULE | Freq: Three times a day (TID) | ORAL | 0 refills | 0 days | Status: CP
Start: 2017-10-20 — End: 2017-11-14

## 2017-10-22 MED FILL — TECFIDERA/240MG/CPDR: TECFIDERA/240MG/CPDR | 30 days supply | Qty: 60 | Fill #1

## 2017-11-11 NOTE — Unmapped (Signed)
Thank you.  Ashlee Christian

## 2017-11-15 NOTE — Unmapped (Signed)
[facility] Refill Coordination Note    Medication(s) to be Shipped:   TECFIDERA 240MG        Ashlee Christian, DOB: 1951/02/09  Phone: [phone] (home) [phone] (work)  Shipping Address: [address]  [place] [address]    All above HIPAA information was verified with patient.     Completed refill call assessment today to schedule patient's medication shipment from the [facility] [phone]).       Specialty medication(s) and dose(s) confirmed: Regimen is correct and unchanged.   Changes to medications: Anna reports starting the following medications: SOMETHING TO HELP WITH MEMORY,BUT SHE STOPPED BC WAS NOT HELPING  Changes to insurance: No  Questions for the pharmacist: No    The patient will receive an FSI print out for each medication shipped and additional FDA Medication Guides as required.  Patient education from [facility] or [name] may also be included in the shipment.    DISEASE-SPECIFIC INFORMATION        N/A    ADHERENCE              Is this medicine covered by Medicare Part B? No.        SHIPPING     Shipping address confirmed in FSI.     Delivery Scheduled: Yes, Expected medication delivery date: 040219 via UPS or courier.     Antonietta Barcelona   [facility] Specialty Technician

## 2017-11-19 MED ORDER — DIMETHYL FUMARATE 240 MG CAPSULE,DELAYED RELEASE: 240 mg | capsule | 1 refills | 0 days

## 2017-11-19 MED ORDER — DIMETHYL FUMARATE 240 MG CAPSULE,DELAYED RELEASE
ORAL_CAPSULE | Freq: Two times a day (BID) | ORAL | 1 refills | 0.00000 days | Status: CP
Start: 2017-11-19 — End: 2018-01-09

## 2017-11-19 MED FILL — TECFIDERA/240MG/CPDR: TECFIDERA/240MG/CPDR | 30 days supply | Qty: 60 | Fill #0

## 2017-11-19 NOTE — Unmapped (Signed)
Pharmacy requesting refills of Tecfidera. Patient last seen in Feb 2019 and has follow up appointment in August. Will need next safety labs in May 2019. Will send 2 refills.    Worthy Flank, PharmD, CPP  Clinical Pharmacist, [facility]  Phone: [phone]

## 2017-11-20 MED ORDER — GABAPENTIN 300 MG CAPSULE
ORAL_CAPSULE | 0 refills | 0 days | Status: CP
Start: 2017-11-20 — End: 2018-04-28

## 2017-11-20 MED ORDER — BACLOFEN 20 MG TABLET
ORAL_TABLET | 1 refills | 0 days | Status: CP
Start: 2017-11-20 — End: 2018-09-20

## 2017-11-26 NOTE — Unmapped (Signed)
Brain and C/T spine MRI orders placed - to be done in June 2019.   Adult sedation  C4-7 ACDF surgery 03/04/17  Desma Mcgregor Basuroski

## 2017-12-02 ENCOUNTER — Encounter: Payer: Self-pay | Admitting: Emergency Medicine

## 2017-12-02 ENCOUNTER — Emergency Department
Admission: EM | Admit: 2017-12-02 | Discharge: 2017-12-02 | Disposition: A | Discharge status: Home / Self Care | Payer: Medicare Other | Attending: Emergency Medicine | Admitting: Emergency Medicine

## 2017-12-02 ENCOUNTER — Emergency Department: Payer: Medicare Other

## 2017-12-02 DIAGNOSIS — Y939 Activity, unspecified: Secondary | ICD-10-CM | POA: Diagnosis not present

## 2017-12-02 DIAGNOSIS — Y999 Unspecified external cause status: Secondary | ICD-10-CM | POA: Diagnosis not present

## 2017-12-02 DIAGNOSIS — Y929 Unspecified place or not applicable: Secondary | ICD-10-CM | POA: Diagnosis not present

## 2017-12-02 DIAGNOSIS — M25551 Pain in right hip: Secondary | ICD-10-CM | POA: Insufficient documentation

## 2017-12-02 DIAGNOSIS — Z87891 Personal history of nicotine dependence: Secondary | ICD-10-CM | POA: Insufficient documentation

## 2017-12-02 DIAGNOSIS — W1830XA Fall on same level, unspecified, initial encounter: Secondary | ICD-10-CM | POA: Diagnosis not present

## 2017-12-02 MED ORDER — DEXAMETHASONE SODIUM PHOSPHATE 10 MG/ML IJ SOLN
10.0000 mg | Freq: Once | INTRAMUSCULAR | Status: AC
  Administered 2017-12-02: 10 mg via INTRAMUSCULAR
  Filled 2017-12-02: qty 1

## 2017-12-02 MED ORDER — OXYCODONE HCL 5 MG PO TABS
5.0000 mg | ORAL_TABLET | Freq: Once | ORAL | Status: AC
  Administered 2017-12-02: 5 mg via ORAL
  Filled 2017-12-02: qty 1

## 2017-12-02 MED ORDER — PREDNISONE 10 MG PO TABS: ORAL_TABLET | ORAL | 0 refills | Status: AC

## 2017-12-02 MED ORDER — OXYCODONE-ACETAMINOPHEN 5-325 MG PO TABS: 1.0000 | ORAL_TABLET | Freq: Four times a day (QID) | ORAL | 0 refills | Status: AC | PRN

## 2017-12-02 NOTE — ED Provider Notes (Signed)
[facility] Emergency Department Provider Note   ____________________________________________   First MD Initiated Contact with Patient 12/02/17 1157     (approximate)  I have reviewed the triage vital signs and the nursing notes.   HISTORY  Chief Complaint Hip Pain  HPI Haley Boyd is a 67 y.o. female here complaint of right hip pain.  Patient states that she fell last week and had some discomfort to her right hip.  She states that she has continued to have soreness but today is worse.  She last took ibuprofen at approximately 7 AM this morning.  She has continued to take this medication with some minimal relief.  She denies any other injuries.  She states this was a mechanical fall and denies syncopal episode.  This fall occurred inside her house.  Patient also has a history of MS.  She rates her pain as 9/10.   Past Medical History:  Diagnosis Date  . Multiple sclerosis (HCC)     There are no active problems to display for this patient.   Past Surgical History:  Procedure Laterality Date  . CERVICAL SPINE SURGERY    . colon bypass      Prior to Admission medications   Medication Sig Start Date End Date Taking? Authorizing Provider  oxyCODONE-acetaminophen (PERCOCET) 5-325 MG tablet Take 1 tablet by mouth every 6 (six) hours as needed for moderate pain. 12/02/17   Tommi Rumps, PA-C  predniSONE (DELTASONE) 10 MG tablet Take 3 tablets once a day for 4 days 12/02/17   Tommi Rumps, PA-C    Allergies Codeine  No family history on file.  Social History Social History   Tobacco Use  . Smoking status: Former Games developer  . Smokeless tobacco: Never Used  Substance Use Topics  . Alcohol use: Yes    Comment: occ  . Drug use: Never    Review of Systems Constitutional: No fever/chills Cardiovascular: Denies chest pain. Respiratory: Denies shortness of breath. Gastrointestinal: No abdominal pain.  No nausea, no vomiting.    Genitourinary: Negative for dysuria. Musculoskeletal: Positive for right hip pain. Skin: Negative for injury. Neurological: Negative for headaches, focal weakness or numbness. ___________________________________________   PHYSICAL EXAM:  VITAL SIGNS: ED Triage Vitals [12/02/17 1125]  Enc Vitals Group     BP 134/65     Pulse Rate 68     Resp 17     Temp 98.4 F (36.9 C)     Temp Source Oral     SpO2 100 %     Weight 137 lb (62.1 kg)     Height 5\' 6"  (1.676 m)     Head Circumference      Peak Flow      Pain Score 9     Pain Loc      Pain Edu?      Excl. in GC?    Constitutional: Alert and oriented. Well appearing and in no acute distress. Eyes: Conjunctivae are normal.  Head: Atraumatic. Neck: No stridor.   Cardiovascular: Normal rate, regular rhythm. Grossly normal heart sounds.  Good peripheral circulation. Respiratory: Normal respiratory effort.  No retractions. Lungs CTAB. Gastrointestinal: Soft and nontender. No distention.  Musculoskeletal: Examination of the right hip there is no gross deformity noted.  There is no point tenderness on palpation and no ecchymosis or abrasions were seen.  There is some minimal pain with compression of the pelvis.  Range of motion is decreased secondary to patient's discomfort.  Nontender to palpation  right knee and ankle.  There is no soft tissue swelling or edema.  No effusion was noted to the right knee. Neurologic:  Normal speech and language. No gross focal neurologic deficits are appreciated.  Skin:  Skin is warm, dry and intact.  No ecchymosis, erythema or abrasions were noted. Psychiatric: Mood and affect are normal. Speech and behavior are normal.  ____________________________________________   LABS (all labs ordered are listed, but only abnormal results are displayed)  Labs Reviewed - No data to display  RADIOLOGY  ED MD interpretation:   Right hip x-ray is negative for acute injury.  Official radiology report(s): Dg  Hip Unilat W Or Wo Pelvis 2-3 Views Right  Result Date: 12/02/2017 CLINICAL DATA:  Fall 4 days ago. Right hip pain. Initial encounter. EXAM: DG HIP (WITH OR WITHOUT PELVIS) 2-3V RIGHT COMPARISON:  None. FINDINGS: There is no evidence of hip fracture or dislocation. There is no evidence of arthropathy or other focal bone abnormality. IMPRESSION: Negative. Electronically Signed   By: Myles Rosenthal M.D.   On: 12/02/2017 12:38    ____________________________________________   PROCEDURES  Procedure(s) performed: None  Procedures  Critical Care performed: No  ____________________________________________   INITIAL IMPRESSION / ASSESSMENT AND PLAN / ED COURSE  As part of my medical decision making, I reviewed the following data within the electronic MEDICAL RECORD NUMBER [place] Controlled Substance Database  Patient presents with right hip pain with a history of falling last week.  Patient has continued to ambulate at home but states that the pain is worse today.  Patient also has a history of MS.  She has been taking over-the-counter medication with some relief except for today.  Patient was given oxycodone while in the department.  X-rays were reassuring that there was no fracture.  Patient received Decadron 10 mg IM along with a prescription for prednisone 3 tablets once a day for the next 4 days and Percocet as needed for pain.  She is to follow-up with her PCP if any continued problems. ____________________________________________   FINAL CLINICAL IMPRESSION(S) / ED DIAGNOSES  Final diagnoses:  Acute pain of right hip     ED Discharge Orders        Ordered    oxyCODONE-acetaminophen (PERCOCET) 5-325 MG tablet  Every 6 hours PRN     12/02/17 1343    predniSONE (DELTASONE) 10 MG tablet     12/02/17 1343       Note:  This document was prepared using Dragon voice recognition software and may include unintentional dictation errors.    Tommi Rumps, PA-C 12/02/17 1410      Minna Antis, MD 12/02/17 1530

## 2017-12-02 NOTE — Discharge Instructions (Addendum)
Follow-up with your primary care provider if any continued problems.  Follow-up with Dr. Joice Lofts who is on-call for orthopedics if any worsening of your symptoms.  Use your walker for added stability.  Continue pain medication as directed.  Be aware that this medication could cause drowsiness and increase your risk for falling.  Use ice to your hip as needed for discomfort.  Continue with prednisone 3 tablets once a day for the next 4 days.

## 2017-12-02 NOTE — ED Triage Notes (Signed)
Pt comes into the ED via POV c/o right hip pain.  Patient states that she fell last Wednesday but denies any other injury to the hip.  Patient in NAD at this time with even and unlabored respirations.  Patient states she gets soreness from time to time when she sleeps on it incorrectly, but today was the worst.

## 2017-12-02 NOTE — ED Notes (Signed)

## 2017-12-07 ENCOUNTER — Emergency Department
Admission: EM | Admit: 2017-12-07 | Discharge: 2017-12-07 | Disposition: A | Discharge status: Home / Self Care | Payer: Medicare Other | Attending: Emergency Medicine | Admitting: Emergency Medicine

## 2017-12-07 ENCOUNTER — Other Ambulatory Visit: Payer: Self-pay

## 2017-12-07 DIAGNOSIS — M25551 Pain in right hip: Secondary | ICD-10-CM | POA: Diagnosis not present

## 2017-12-07 DIAGNOSIS — Z87891 Personal history of nicotine dependence: Secondary | ICD-10-CM | POA: Diagnosis not present

## 2017-12-07 DIAGNOSIS — G35 Multiple sclerosis: Secondary | ICD-10-CM | POA: Diagnosis not present

## 2017-12-07 MED ORDER — TRAMADOL HCL 50 MG PO TABS
50.0000 mg | ORAL_TABLET | Freq: Once | ORAL | Status: AC
  Administered 2017-12-07: 50 mg via ORAL
  Filled 2017-12-07: qty 1

## 2017-12-07 MED ORDER — KETOROLAC TROMETHAMINE 30 MG/ML IJ SOLN: 30.0000 mg | Freq: Once | INTRAMUSCULAR | Status: DC

## 2017-12-07 MED ORDER — TRAMADOL HCL 50 MG PO TABS: 50.0000 mg | ORAL_TABLET | Freq: Four times a day (QID) | ORAL | 0 refills | Status: AC | PRN

## 2017-12-07 NOTE — ED Triage Notes (Signed)
Pt states that she fell last Sunday, pt states that she cont to have rt hip and rt leg pain, pt states that she woke up with pain this am, pt ambulatory without difficulty into lobby, carrying her walker with minimal assistance of walker. Pt states that she took her last oxycodone at 0700. Pt's eyelids appear heavy

## 2017-12-07 NOTE — ED Provider Notes (Signed)
[facility] Emergency Department Provider Note   ____________________________________________   First MD Initiated Contact with Patient 12/07/17 1315     (approximate)  I have reviewed the triage vital signs and the nursing notes.   HISTORY  Chief Complaint Hip Pain and Leg Pain    HPI Haley Boyd is a 67 y.o. female patient presents with continued right hip and right leg pain secondary to a fall 2 weeks ago.  Patient was seen dislocation week after a fall and x-rays of the hip were unremarkable.  Patient states she responded well to Percocet and steroids over the medicine well for complaint return.  Patient is unable to get in to see her family doctor because day before yesterday and it closed now for the Easter holiday until Tuesday.  Patient rates the pain as a 9/10.  Patient described the pain is "aching".  No current palliative measures for complaint.  Past Medical History:  Diagnosis Date  . Multiple sclerosis (HCC)     There are no active problems to display for this patient.   Past Surgical History:  Procedure Laterality Date  . CERVICAL SPINE SURGERY    . colon bypass      Prior to Admission medications   Medication Sig Start Date End Date Taking? Authorizing Provider  oxyCODONE-acetaminophen (PERCOCET) 5-325 MG tablet Take 1 tablet by mouth every 6 (six) hours as needed for moderate pain. 12/02/17   Tommi Rumps, PA-C  predniSONE (DELTASONE) 10 MG tablet Take 3 tablets once a day for 4 days 12/02/17   Tommi Rumps, PA-C  traMADol (ULTRAM) 50 MG tablet Take 1 tablet (50 mg total) by mouth every 6 (six) hours as needed. 12/07/17 12/07/18  Joni Reining, PA-C    Allergies Codeine  No family history on file.  Social History Social History   Tobacco Use  . Smoking status: Former Games developer  . Smokeless tobacco: Never Used  Substance Use Topics  . Alcohol use: Yes    Comment: occ  . Drug use: Never    Review of  Systems Constitutional: No fever/chills Eyes: No visual changes. ENT: No sore throat. Cardiovascular: Denies chest pain. Respiratory: Denies shortness of breath. Gastrointestinal: No abdominal pain.  No nausea, no vomiting.  No diarrhea.  No constipation. Genitourinary: Negative for dysuria. Musculoskeletal: Right hip pain. Skin: Negative for rash. Neurological: Negative for headaches, focal weakness or numbness. Allergic/Immunilogical: Codeine ____________________________________________   PHYSICAL EXAM:  VITAL SIGNS: ED Triage Vitals  Enc Vitals Group     BP 12/07/17 1246 (!) 142/66     Pulse Rate 12/07/17 1246 66     Resp 12/07/17 1246 18     Temp 12/07/17 1246 98 F (36.7 C)     Temp Source 12/07/17 1246 Oral     SpO2 12/07/17 1246 99 %     Weight 12/07/17 1247 140 lb (63.5 kg)     Height 12/07/17 1247 5\' 6"  (1.676 m)     Head Circumference --      Peak Flow --      Pain Score 12/07/17 1247 9     Pain Loc --      Pain Edu? --      Excl. in GC? --    Constitutional: Alert and oriented. Well appearing and in no acute distress. Hematological/Lymphatic/Immunilogical: No cervical lymphadenopathy. Cardiovascular: Normal rate, regular rhythm. Grossly normal heart sounds.  Good peripheral circulation. Respiratory: Normal respiratory effort.  No retractions. Lungs CTAB. Gastrointestinal: Soft and  nontender. No distention. No abdominal bruits. No CVA tenderness. Musculoskeletal: No obvious hip deformity.  No leg length discrepancy.  There is no abrasion or ecchymosis.  There is no guarding palpation of the right hip.  Range of motion decreased with flexion.  Patient also has guarding with internal and external rotation.  There is no effusion of the lower extremity. Neurologic:  Normal speech and language. No gross focal neurologic deficits are appreciated. No gait instability. Skin:  Skin is warm, dry and intact. No rash noted. Psychiatric: Mood and affect are normal. Speech and  behavior are normal.  ____________________________________________   LABS (all labs ordered are listed, but only abnormal results are displayed)  Labs Reviewed - No data to display ____________________________________________  EKG   ____________________________________________  RADIOLOGY  ED MD interpretation:    Official radiology report(s): No results found.  ____________________________________________   PROCEDURES  Procedure(s) performed: None  Procedures  Critical Care performed: No  ____________________________________________   INITIAL IMPRESSION / ASSESSMENT AND PLAN / ED COURSE  As part of my medical decision making, I reviewed the following data within the electronic MEDICAL RECORD NUMBER    Continual right hip pain secondary to a fall.  This is possible inflammatory process.  Advised supportive care and follow-up PCP for continued care.  Advised tramadol for acute pain and over-the-counter anti-inflammatories as needed.      ____________________________________________   FINAL CLINICAL IMPRESSION(S) / ED DIAGNOSES  Final diagnoses:  Right hip pain     ED Discharge Orders        Ordered    traMADol (ULTRAM) 50 MG tablet  Every 6 hours PRN     12/07/17 1330       Note:  This document was prepared using Dragon voice recognition software and may include unintentional dictation errors.    Joni Reining, PA-C 12/07/17 1338    Dionne Bucy, MD 12/07/17 [phone]

## 2017-12-07 NOTE — Discharge Instructions (Signed)
Patient advised to follow-up family doctor within 3 days for continued care.

## 2017-12-07 NOTE — Unmapped (Signed)
Patient left a VM stating that she is in increased pain in her legs. She states that her legs a numb feeling. She went to [facility] ER they did an X-Ray and gave her some pain medication. She states she is still in extreme pain would like a call.

## 2017-12-14 NOTE — Unmapped (Addendum)
[facility] Refill Coordination Note  Specialty Medication(s): Tecfidera 240mg  capsules  Additional Medications shipped: none    Ashlee Christian, DOB: May 17, 1951  Phone: [phone] (home) [phone] (work), Alternate phone contact: N/A  Phone or address changes today?: No  All above HIPAA information was verified with patient.  Shipping Address: [address]  [place] [address]   Insurance changes? No    Completed refill call assessment today to schedule patient's medication shipment from the [facility] [phone]).      Confirmed the medication and dosage are correct and have not changed: Yes, regimen is correct and unchanged.    Confirmed patient started or stopped the following medications in the past month:  Yes. Ashlee Christian reports starting the following medications: tramadol (when asked patient stated she fell - not related to medication therapy)    Are you tolerating your medication?:  Ashlee Christian reports tolerating the medication.    ADHERENCE    Is this medicine transplant or covered by Medicare Part B? No.        Did you miss any doses in the past 4 weeks? Yes.  Ashlee Christian reports missing a couple days of medication therapy in the last 4 weeks.  Ashlee Christian reports forgetting as the cause of their non-adherance. Discussed compliance with patient and she mentioned starting to leave her daily dose sitting by her lamp so she will remember to take both doses - patient stated this has really helped her.     FINANCIAL/SHIPPING    Delivery Scheduled: Yes, Expected medication delivery date: 12/18/17     The patient will receive an FSI print out for each medication shipped and additional FDA Medication Guides as required.  Patient education from [place] or [name] may also be included in the shipment.    Ashlee Christian did not have any additional questions at this time.    Delivery address validated in FSI scheduling system: Yes, address listed in FSI is correct.    We will follow up with patient monthly for standard refill processing and delivery.      Thank you,  Mervyn Gay   [facility] Specialty Pharmacist

## 2017-12-17 MED FILL — TECFIDERA/240MG/CPDR: TECFIDERA/240MG/CPDR | 30 days supply | Qty: 60 | Fill #1

## 2017-12-24 ENCOUNTER — Ambulatory Visit
Admit: 2017-12-24 | Discharge: 2017-12-24 | Payer: MEDICARE | Attending: Orthopaedic Surgery of the Spine | Primary: Orthopaedic Surgery of the Spine

## 2017-12-24 ENCOUNTER — Ambulatory Visit: Admit: 2017-12-24 | Discharge: 2017-12-24 | Payer: MEDICARE

## 2017-12-24 DIAGNOSIS — M5416 Radiculopathy, lumbar region: Secondary | ICD-10-CM

## 2017-12-24 DIAGNOSIS — M25551 Pain in right hip: Principal | ICD-10-CM

## 2017-12-24 DIAGNOSIS — M79601 Pain in right arm: Secondary | ICD-10-CM

## 2017-12-24 DIAGNOSIS — Z981 Arthrodesis status: Principal | ICD-10-CM

## 2017-12-24 NOTE — Unmapped (Addendum)
??   Your diagnosis: right upper arm pain, Lumbar radiculopathy and right hip pain    ?? Recommendations/Plan  ?? Medications: Take over-the-counter medications as needed and as tolerated  ?? If you don't have liver disease, the safest medication for pain is acetaminophen (Tylenol).  Take 1000mg  (2 extra strength tabs) at a time for pain relief.  Do not take more than 3000mg  per day.  ?? Try Salonpas Lidocaine 4% patches ([email or web address].[email or web address]/)  ?? Activities as tolerated.  ?? EMG studies of your right upper arm  ?? Imaging studies: Right hip x-ray on the way out  ?? MRI of the Lumbosacral spine was ordered. Medical necessity: Radiographs have been performed/ordered.  ?? Follow-up: After imaging studies.     ?? Avoid nicotine/tobacco  ?? Minimize alcohol intake  ?? Maintain a healthy weight    ?? Stay physically active and exercise regularly as tolerated ([email or web address])  ?? Maintain good sleeping habits    ?? We want to improve, and you can help.  You may receive a survey asking you about your visit.  Please take a few minutes to complete the survey.  We will use your feedback to make improvements.    ?? Contact our team electronically via MyChart message to [facility] Clinical Staff.    ?? Contact our team at [phone] or [phone] with any questions/concerns.    ?? Please visit and like [place] on our Facebook page to learn more about our services.   [email or web address]
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
## 2018-01-08 NOTE — Unmapped (Signed)
[facility] Specialty Pharmacy Refill Coordination Note  Specialty Medication(s): TECFIDERA [address]    Ashlee Christian, DOB: 1951-08-13  Phone: [phone] (home) [phone] (work), Alternate phone contact: N/A  Phone or address changes today?: No  All above HIPAA information was verified with patient.  Shipping Address: [address]  [place] [address]   Insurance changes? No    Completed refill call assessment today to schedule patient's medication shipment from the [facility] [phone]).      Confirmed the medication and dosage are correct and have not changed: Yes, regimen is correct and unchanged.    Confirmed patient started or stopped the following medications in the past month:  No, there are no changes reported at this time.    Are you tolerating your medication?:  Ashlee Christian reports tolerating the medication.    ADHERENCE    (Below is required for Medicare Part B or Transplant patients only - per drug):   How many tablets were dispensed last month: 60  Patient currently has MAYBE 10 TO 14 DAYS remaining.    Did you miss any doses in the past 4 weeks? No missed doses reported.    FINANCIAL/SHIPPING    Delivery Scheduled: Yes, Expected medication delivery date: 5/30 VIA [facility] [place]     The patient will receive an FSI print out for each medication shipped and additional FDA Medication Guides as required.  Patient education from [place] or [name] may also be included in the shipment    [place] did not have any additional questions at this time.    Delivery address validated in FSI scheduling system: Yes, address listed in FSI is correct.    We will follow up with patient monthly for standard refill processing and delivery.      Thank you,  Westley Gambles   [facility] Specialty Technician

## 2018-01-09 MED ORDER — DIMETHYL FUMARATE 240 MG CAPSULE,DELAYED RELEASE: 240 mg | capsule | 0 refills | 0 days

## 2018-01-09 MED ORDER — DIMETHYL FUMARATE 240 MG CAPSULE,DELAYED RELEASE
ORAL_CAPSULE | Freq: Two times a day (BID) | ORAL | 0 refills | 0.00000 days | Status: CP
Start: 2018-01-09 — End: 2018-01-09

## 2018-01-09 NOTE — Unmapped (Signed)
Hi Stephanie,     I called the patient.  She is changing PCPs and will be seeing new PCP at [facility] on 5/31.  She will get the labs drawn there if you can please put in Epic.

## 2018-01-09 NOTE — Unmapped (Signed)
Pharmacy requesting refill on Tecfidera. Patient has appropriate follow up appointments but is due for safety lab work this month. Will refill 1 month and put in orders for lab work.    Worthy Flank, PharmD, CPP  Clinical Pharmacist, [facility]  Phone: [phone]

## 2018-01-15 MED FILL — TECFIDERA/240MG/CPDR: TECFIDERA/240MG/CPDR | 30 days supply | Qty: 60 | Fill #0

## 2018-01-18 ENCOUNTER — Ambulatory Visit: Admit: 2018-01-18 | Discharge: 2018-01-18 | Payer: MEDICARE | Attending: Family Medicine | Primary: Family Medicine

## 2018-01-18 DIAGNOSIS — M810 Age-related osteoporosis without current pathological fracture: Secondary | ICD-10-CM

## 2018-01-18 DIAGNOSIS — I10 Essential (primary) hypertension: Secondary | ICD-10-CM

## 2018-01-18 DIAGNOSIS — G35 Multiple sclerosis: Principal | ICD-10-CM

## 2018-01-18 DIAGNOSIS — Z136 Encounter for screening for cardiovascular disorders: Secondary | ICD-10-CM

## 2018-01-18 DIAGNOSIS — Z13220 Encounter for screening for lipoid disorders: Secondary | ICD-10-CM

## 2018-01-18 DIAGNOSIS — Z79899 Other long term (current) drug therapy: Secondary | ICD-10-CM

## 2018-01-18 LAB — LIPID PANEL
CHOLESTEROL: 189 mg/dL (ref 100–199)
HDL CHOLESTEROL: 74 mg/dL — ABNORMAL HIGH (ref 40–59)
LDL CHOLESTEROL CALCULATED: 100 mg/dL — ABNORMAL HIGH (ref 60–99)
NON-HDL CHOLESTEROL: 115 mg/dL

## 2018-01-18 LAB — CREATININE, URINE: Lab: 102.6

## 2018-01-18 LAB — SODIUM: Sodium:SCnc:Pt:Ser/Plas:Qn:: 131 — ABNORMAL LOW

## 2018-01-18 LAB — BASIC METABOLIC PANEL
ANION GAP: 9 mmol/L (ref 9–15)
BLOOD UREA NITROGEN: 17 mg/dL (ref 7–21)
BUN / CREAT RATIO: 31
CALCIUM: 8.8 mg/dL (ref 8.5–10.2)
CHLORIDE: 94 mmol/L — ABNORMAL LOW (ref 98–107)
CREATININE: 0.55 mg/dL — ABNORMAL LOW (ref 0.60–1.00)
EGFR MDRD AF AMER: >=60 mL/min/{1.73_m2} (ref >=60)
EGFR MDRD NON AF AMER: >=60 mL/min/{1.73_m2} (ref >=60)
GLUCOSE RANDOM: 104 mg/dL (ref 65–179)
POTASSIUM: 4.6 mmol/L (ref 3.5–5.0)
SODIUM: 131 mmol/L — ABNORMAL LOW (ref 135–145)

## 2018-01-18 LAB — VLDL CHOLESTEROL CAL: Cholesterol.in VLDL:MCnc:Pt:Ser/Plas:Qn:Calculated: 14.8

## 2018-01-18 LAB — MAGNESIUM: Magnesium:MCnc:Pt:Ser/Plas:Qn:: 2.1

## 2018-01-18 LAB — ALBUMIN / CREATININE URINE RATIO: CREATININE, URINE: 102.6 mg/dL

## 2018-01-18 NOTE — Unmapped (Signed)
Continue your present medicines.     Continue your taper of clonazepam as outlined by the psychiatrist.     Follow up in 2 months for wellness visit.      An order for a Bone Density test has been placed.

## 2018-01-18 NOTE — Unmapped (Signed)
Assessment:        1. Essential hypertension    2. MS (multiple sclerosis) (CMS-HCC)    3. Age-related osteoporosis without current pathological fracture    4. Long-term use of high-risk medication    5. Encounter for lipid screening for cardiovascular disease            Plan:        Patient instructions:  Continue your present medicines.     Continue your taper of clonazepam as outlined by the psychiatrist.     Follow up in 2 months for wellness visit.      An order for a Bone Density test has been placed.     Barriers to goals identified and addressed. Pertinent handouts were given today and reviewed with the patient as indicated.  The Care Plan and Self-Management goals have been included on the AVS and the AVS has been printed. Any outside resources or referrals needed at this time are noted above.  Any new medications prescribed have been discussed, and side effects have been addressed. Have assessed the patient's understanding, response, and barriers to adherence to medications. Patient voiced understanding and all questions have been answered to satisfaction.        Subjective:     Ashlee Christian is a 67 y.o. female.    HPI:  She is here to establish for care.  She has MS followed by neurology, anxiety followed by psychiatry and undergoing very gradual clonazepam taper with institution of low dose trazodone.     On Amlodipine for HTN.    She would like to be mentally clearer and more steady on her feet.  She would like to taper some of her meds to see if this will help.     She is no Omeprazole for gastric protection prescribed by her neurologist.      She has a history of osteoporosis presently on Vit D.  She is due to repeat DEXA, last done 07/2015     The following portions of the patient's history were reviewed and updated as appropriate: allergies, current medications, past family history, past medical history, past social history, past surgical history and problem list.    Review of Systems Pertinent items are noted in HPI.          Objective:      Physical Exam:  VITAL SIGNS: BP 130/80  - Pulse 76  - Temp 37.3 ??C (99.1 ??F) (Oral)  - Resp 18  - Ht 168.9 cm (5' 6.5)  - Wt 61 kg (134 lb 6.4 oz)  - BMI 21.37 kg/m??    GENERAL: Well appearing, alert, in NAD  HEAD: Normocephalic and atraumatic  NEUROLOGIC:  Alert, oriented to person, place and time. Cr N II-XII intact. Normal speech  PSYCHOLOGIC: No evidence of anxiety or depression.  Normal mood.  Appropriate affect

## 2018-01-31 MED ORDER — LISINOPRIL 2.5 MG TABLET
ORAL_TABLET | Freq: Every day | ORAL | 1 refills | 0 days | Status: CP
Start: 2018-01-31 — End: 2018-02-27

## 2018-02-05 ENCOUNTER — Ambulatory Visit: Admit: 2018-02-05 | Discharge: 2018-02-05 | Payer: MEDICARE

## 2018-02-05 DIAGNOSIS — G35 Multiple sclerosis: Principal | ICD-10-CM

## 2018-02-05 DIAGNOSIS — M5416 Radiculopathy, lumbar region: Secondary | ICD-10-CM

## 2018-02-07 NOTE — Unmapped (Addendum)
[facility] Specialty Pharmacy Refill Coordination Note    Specialty Medication(s) to be Shipped:   Neurology: Tecfidera 240MG     Other medication(s) to be shipped:       Ashlee Christian, DOB: 1951/02/15  Phone: [phone] (home)   Shipping Address: [address]  [place] [address]    All above HIPAA information was verified with patient.     Completed refill call assessment today to schedule patient's medication shipment from the [facility] [phone]).       Specialty medication(s) and dose(s) confirmed: Regimen is correct and unchanged.   Changes to medications: Ashlee Christian reports starting the following medications: REMERON 15MG  AND LISINOPRIL  Changes to insurance: No  Questions for the pharmacist: No    The patient will receive an FSI print out for each medication shipped and additional FDA Medication Guides as required.  Patient education from Ashlee Christian or Ashlee Christian may also be included in the shipment.    DISEASE-SPECIFIC INFORMATION        N/A    ADHERENCE              MEDICARE PART B DOCUMENTATION         SHIPPING     Shipping address confirmed in FSI.     Delivery Scheduled: Yes, Expected medication delivery date: [id number] [facility] [place] via UPS or courier.     Ashlee Christian   [facility] Specialty Technician

## 2018-02-11 NOTE — Unmapped (Signed)
Specialty Pharmacy - Neurology Medication Clinical Assessment       Ashlee Christian. [place] is a 67 y.o. female contacted today regarding  her specialty medication(s) dimethyl fumarate (TECFIDERA)    Verified patient's date of birth / HIPAA.    Medications reviewed and verified with patient: Allergies - Medications -      Specialty medication(s) and dose(s) confirmed: yes  Changes to medications: yes  Changes to insurance: no     Is therapy still appropriate given the disease, patient response, and medical condition? yes  Is therapy still effective? yes    Medication Adherence    Patient reported X missed doses in the last month:  0  Patient is on additional specialty medications:  No  Patient is on more than two specialty medications:  No  Demonstrates understanding of importance of adherence:  yes  Reliability of informant:  reliable  Provider-estimated medication adherence level:  90-100%  Patient is at risk for Non-Adherence:  No  Reasons for non-adherence:  no problems identified           Adverse Effects    Rhinorrhea:  Pos  *All other systems reviewed and are negative       Drug Interactions    Drug interactions evaluated:  no  Clinically relevant drug interactions identified:  no           The patient will receive an FSI print out for each medication shipped and additional FDA Medication Guides as required. Patient education from [place] or [name] may also be included in the shipment.       Simmie Davies, PharmD, BCPS  PGY2 Ambulatory Care Pharmacy Resident    Worthy Flank, PharmD, CPP  Clinical Pharmacist, [facility]  Phone: [phone]

## 2018-02-19 ENCOUNTER — Ambulatory Visit: Admit: 2018-02-19 | Discharge: 2018-02-20 | Payer: MEDICARE

## 2018-02-19 DIAGNOSIS — G35 Multiple sclerosis: Principal | ICD-10-CM

## 2018-02-19 DIAGNOSIS — Z79899 Other long term (current) drug therapy: Secondary | ICD-10-CM

## 2018-02-19 LAB — BILIRUBIN DIRECT: Bilirubin.glucuronidated:MCnc:Pt:Ser/Plas:Qn:: <0.1

## 2018-02-19 LAB — COMPREHENSIVE METABOLIC PANEL
ALBUMIN: 4.3 g/dL (ref 3.5–5.0)
ALKALINE PHOSPHATASE: 61 U/L (ref 38–126)
ALT (SGPT): 19 U/L (ref 15–48)
ANION GAP: 8 mmol/L — ABNORMAL LOW (ref 9–15)
AST (SGOT): 19 U/L (ref 14–38)
BLOOD UREA NITROGEN: 13 mg/dL (ref 7–21)
BUN / CREAT RATIO: 33
CALCIUM: 9.3 mg/dL (ref 8.5–10.2)
CHLORIDE: 94 mmol/L — ABNORMAL LOW (ref 98–107)
CO2: 27 mmol/L (ref 22.0–30.0)
CREATININE: 0.39 mg/dL — ABNORMAL LOW (ref 0.60–1.00)
EGFR CKD-EPI AA FEMALE: >90 mL/min/{1.73_m2} (ref >=60)
EGFR CKD-EPI NON-AA FEMALE: >90 mL/min/{1.73_m2} (ref >=60)
GLUCOSE RANDOM: 94 mg/dL (ref 65–179)
PROTEIN TOTAL: 6.5 g/dL (ref 6.5–8.3)
SODIUM: 129 mmol/L — ABNORMAL LOW (ref 135–145)

## 2018-02-19 LAB — CBC W/ AUTO DIFF
BASOPHILS ABSOLUTE COUNT: 0 10*9/L (ref 0.0–0.1)
BASOPHILS RELATIVE PERCENT: 0.5 %
EOSINOPHILS ABSOLUTE COUNT: 0.1 10*9/L (ref 0.0–0.4)
EOSINOPHILS RELATIVE PERCENT: 2.2 %
HEMATOCRIT: 37.1 % (ref 36.0–46.0)
HEMOGLOBIN: 12.5 g/dL (ref 12.0–16.0)
LARGE UNSTAINED CELLS: 2 % (ref 0–4)
LYMPHOCYTES ABSOLUTE COUNT: 0.5 10*9/L — ABNORMAL LOW (ref 1.5–5.0)
LYMPHOCYTES RELATIVE PERCENT: 13.1 %
MEAN CORPUSCULAR HEMOGLOBIN CONC: 33.7 g/dL (ref 31.0–37.0)
MEAN CORPUSCULAR HEMOGLOBIN: 31.9 pg (ref 26.0–34.0)
MEAN CORPUSCULAR VOLUME: 94.7 fL (ref 80.0–100.0)
MEAN PLATELET VOLUME: 7.6 fL (ref 7.0–10.0)
MONOCYTES ABSOLUTE COUNT: 0.3 10*9/L (ref 0.2–0.8)
NEUTROPHILS ABSOLUTE COUNT: 2.5 10*9/L (ref 2.0–7.5)
NEUTROPHILS RELATIVE PERCENT: 72.6 %
PLATELET COUNT: 334 10*9/L (ref 150–440)
RED CELL DISTRIBUTION WIDTH: 12.8 % (ref 12.0–15.0)

## 2018-02-19 LAB — EOSINOPHILS ABSOLUTE COUNT: Lab: 0.1

## 2018-02-19 LAB — ALT (SGPT): Alanine aminotransferase:CCnc:Pt:Ser/Plas:Qn:: 19

## 2018-02-26 MED ORDER — DIMETHYL FUMARATE 240 MG CAPSULE,DELAYED RELEASE: 240 mg | capsule | 0 refills | 0 days

## 2018-02-26 MED ORDER — DIMETHYL FUMARATE 240 MG CAPSULE,DELAYED RELEASE
ORAL_CAPSULE | Freq: Two times a day (BID) | ORAL | 0 refills | 0.00000 days | Status: CP
Start: 2018-02-26 — End: 2018-03-20

## 2018-02-27 MED ORDER — LOSARTAN 25 MG TABLET
ORAL_TABLET | Freq: Every day | ORAL | 1 refills | 0.00000 days | Status: CP
Start: 2018-02-27 — End: 2018-04-27

## 2018-02-27 MED FILL — TECFIDERA/240MG/CPDR: TECFIDERA/240MG/CPDR | 30 days supply | Qty: 60 | Fill #0

## 2018-02-27 NOTE — Unmapped (Signed)
JUST GOT NEW RX BACK FOR REFILLS FOR PATIENTS TECFIDERA SHE HAD TO HAVE BLOOD WORK COMPLETED,WE ARE GOING TO DELIVERY [facility] TODAY [id number].

## 2018-02-27 NOTE — Unmapped (Signed)
Pt called and stated that since she started the Lisinopril she has been coughing. It started off mild and began to get worse.   She wanted to know if you can switch her to another medication with less side effects.     Please advise. Thanks

## 2018-02-28 ENCOUNTER — Ambulatory Visit: Admit: 2018-02-28 | Discharge: 2018-02-28 | Payer: MEDICARE

## 2018-02-28 DIAGNOSIS — M810 Age-related osteoporosis without current pathological fracture: Principal | ICD-10-CM

## 2018-02-28 NOTE — Unmapped (Signed)
1) STOP Lisinopril  2) Start Losartan 25mg  once daily  3) Ask her to keep a record of BPs between now and her AWV at the end of the month   4) get repeat labs a few days prior to the visit since starting new med.

## 2018-02-28 NOTE — Unmapped (Signed)
Spoke to pt and relayed the message.  She verbalized understanding.

## 2018-03-06 MED ORDER — CARBAMAZEPINE ER 200 MG CAPSULE,EXTENDED RELEASE MPHASE12HR
ORAL_CAPSULE | 2 refills | 0 days | Status: CP
Start: 2018-03-06 — End: 2018-06-13

## 2018-03-06 NOTE — Unmapped (Signed)
Refilled Carbamazepine: per patient's request: 400 mg in the morning, 200 mg in the afternoon and 400 mg in the evening.  Ashlee Christian  .........  Bernerd Pho, MD   Phone Number: [phone]      ??      ----- Message from Mychart, Generic sent at 03/04/2018 12:39 PM EDT -----     Dr. Johnnye Lana,   200mg  2 in the morning and 2 at night. If it is OK, I'd like to take the 1 200mg  in the middle of the day. I have had a lot of tightness in my ribs that has been very uncomfortable.   Thank you, Ashlee Christian    Previous Messages      ----- Message -----   From: Sarita Bottom, MD   Sent: 03/01/2018 ??9:53 PM EDT   To: Ashlee Christian   Subject: RE: Medication Question   No problem, I will send the refill.   Please just confirm which dose are you taking now.   Thank you.   Dr D.     ----- Message -----   ???? From: Ashlee Christian   ???? Sent: 03/01/2018 ??4:40 PM EDT   ???? ?? To: Sarita Bottom, MD   Subject: Medication Question     Dr. Johnnye Lana   I need a new prescription for Carbmazepine. It is not on the list where you can ask for a refill my chart. There are no more refills and it is not on the list for [facility] mail order to ask them to request it from you. I have some, but it takes a couple of weeks for them to send it to me.   Thank you, Ashlee Christian

## 2018-03-08 ENCOUNTER — Ambulatory Visit: Admit: 2018-03-08 | Discharge: 2018-03-09 | Payer: MEDICARE

## 2018-03-08 DIAGNOSIS — M79601 Pain in right arm: Principal | ICD-10-CM

## 2018-03-11 ENCOUNTER — Ambulatory Visit: Admit: 2018-03-11 | Discharge: 2018-03-12 | Payer: MEDICARE | Attending: Neurology | Primary: Neurology

## 2018-03-11 DIAGNOSIS — I10 Essential (primary) hypertension: Secondary | ICD-10-CM

## 2018-03-11 DIAGNOSIS — G35 Multiple sclerosis: Principal | ICD-10-CM

## 2018-03-11 DIAGNOSIS — F3289 Other specified depressive episodes: Secondary | ICD-10-CM

## 2018-03-11 DIAGNOSIS — R419 Unspecified symptoms and signs involving cognitive functions and awareness: Secondary | ICD-10-CM

## 2018-03-11 DIAGNOSIS — Z981 Arthrodesis status: Secondary | ICD-10-CM

## 2018-03-11 DIAGNOSIS — M792 Neuralgia and neuritis, unspecified: Secondary | ICD-10-CM

## 2018-03-11 LAB — CBC W/ AUTO DIFF
BASOPHILS ABSOLUTE COUNT: 0 10*9/L (ref 0.0–0.1)
BASOPHILS RELATIVE PERCENT: 0.5 %
EOSINOPHILS ABSOLUTE COUNT: 0.1 10*9/L (ref 0.0–0.4)
EOSINOPHILS RELATIVE PERCENT: 2.7 %
HEMATOCRIT: 38.5 % (ref 36.0–46.0)
HEMOGLOBIN: 12.7 g/dL (ref 12.0–16.0)
LARGE UNSTAINED CELLS: 2 % (ref 0–4)
LYMPHOCYTES ABSOLUTE COUNT: 0.6 10*9/L — ABNORMAL LOW (ref 1.5–5.0)
LYMPHOCYTES RELATIVE PERCENT: 14 %
MEAN CORPUSCULAR HEMOGLOBIN: 32.1 pg (ref 26.0–34.0)
MEAN CORPUSCULAR VOLUME: 97 fL (ref 80.0–100.0)
MEAN PLATELET VOLUME: 7.4 fL (ref 7.0–10.0)
MONOCYTES ABSOLUTE COUNT: 0.4 10*9/L (ref 0.2–0.8)
MONOCYTES RELATIVE PERCENT: 9.4 %
NEUTROPHILS ABSOLUTE COUNT: 2.9 10*9/L (ref 2.0–7.5)
NEUTROPHILS RELATIVE PERCENT: 71.7 %
RED BLOOD CELL COUNT: 3.97 10*12/L — ABNORMAL LOW (ref 4.00–5.20)
RED CELL DISTRIBUTION WIDTH: 12.9 % (ref 12.0–15.0)
WBC ADJUSTED: 4 10*9/L — ABNORMAL LOW (ref 4.5–11.0)

## 2018-03-11 LAB — BASIC METABOLIC PANEL
ANION GAP: 8 mmol/L — ABNORMAL LOW (ref 9–15)
BLOOD UREA NITROGEN: 18 mg/dL (ref 7–21)
BUN / CREAT RATIO: 35
CALCIUM: 9.5 mg/dL (ref 8.5–10.2)
CHLORIDE: 91 mmol/L — ABNORMAL LOW (ref 98–107)
CO2: 31 mmol/L — ABNORMAL HIGH (ref 22.0–30.0)
EGFR CKD-EPI AA FEMALE: >90 mL/min/{1.73_m2} (ref >=60)
EGFR CKD-EPI NON-AA FEMALE: >90 mL/min/{1.73_m2} (ref >=60)
GLUCOSE RANDOM: 90 mg/dL (ref 65–179)
POTASSIUM: 5.1 mmol/L — ABNORMAL HIGH (ref 3.5–5.0)
SODIUM: 130 mmol/L — ABNORMAL LOW (ref 135–145)

## 2018-03-11 LAB — BLOOD UREA NITROGEN: Urea nitrogen:MCnc:Pt:Ser/Plas:Qn:: 18

## 2018-03-11 LAB — HEMOGLOBIN: Lab: 12.7

## 2018-03-11 NOTE — Unmapped (Addendum)
1) may increase to 3 times 0.5 mg of Klonopin to see if that would help you reduce the MS hug, since you have an experience that when you were taking higher doses of Clonopin, MS hug episodes were less frequent,.  2) We have already sent a prescription for Carbamazepine : 400 mg in the morning, 200 mg at lunchtime and 400 mg in the evening.   3) Continue with gabapentin 300 mg TID  4) Continue with tecfidera 240 mg/day  5) We will take Vitamin D today a  6)[facility]-up at [facility] in [facility] months,e arlier if needed.     ?   In case of:  ? a suspected relapse (new symptoms or worsening existing symptoms, lasting for >24h)  OR  ? a need for an additional appointment for other reasons     Please contact:    [facility] Desk  Phone: [phone]          Sarita Bottom, MD  Clinical Associate Professor of Neurology  [facility], Department of Neurology  Multiple Sclerosis/Neuroimmunology Division  [address], [place], [place] [address]  n MRI

## 2018-03-11 NOTE — Unmapped (Signed)
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   Brachioradialis  +2 +2   Triceps +2 +2   Patella +1 +1   Achilles +1 +1     Normal tone b/l. Negative Babinski sign bilaterally.    Sensory system:  ? Superficial light touch sensation: Mild right sided facial and tongue hypesthesia. No sensory level.   ? Vibration sense: decreased in all 4 limbs  ? Position sense: WNL.   Pinprick test for pain sensation: WNL.  (WNL= within normal limits; UE= upper extremities; LE= lower extremities; R= right, L= left).    Cerebellar/Coordination:  No UE ataxia or intention tremor, No LE ataxia, Mild  gait ataxia. Romberg positive. Can not perform a tandem gait.    Gait: Paraparetic and ataxic, possible without assistance, needs unilateral assistance for longer distances.     Tests for meningeal irritation: negative.    EDSS score = 6.0  .........................................................................................................................................[name]    VISIT SUMMARY:  Ms. SANDA DEJOY, a 67 y.o. Caucasian right handed female  presented for the follow-up of MS. Follow up  is planned at the [facility] in 3 months or earlier, if needed. Ms. AVELYNN SELLIN voiced a complete understanding of the diagnostic and treatment plan as detailed above.   Start of Visit Time: 15:06h  End of Visit Time: 16:13h  Total visit time =  67 minutes    Greater than 50% of the face to face time was spent in consultation on the  disease process,  and treatment planning, medication, dosing and side effects. An extended visit was needed due to the complexity of the case.     Thank you for the opportunity to contribute to the care of Ashlee Christian.

## 2018-03-14 LAB — VITAMIN D, TOTAL (25OH): Lab: 33.5

## 2018-03-17 NOTE — Unmapped (Signed)
BMP order placed to monitor on sodium levels after the increase in carbamazepine, patient will do with the PCP.  Ashlee Christian notified to fax the order to the PCP.     Ashlee Christian

## 2018-03-17 NOTE — Unmapped (Signed)
Minor CBC  abnormalities, non clinically significant at this stage.

## 2018-03-17 NOTE — Unmapped (Addendum)
Still low sodium and mildly elevated potassium. Stable.  Will send patient a mychart message.

## 2018-03-18 ENCOUNTER — Ambulatory Visit: Admit: 2018-03-18 | Discharge: 2018-03-19 | Payer: MEDICARE

## 2018-03-18 DIAGNOSIS — G35 Multiple sclerosis: Principal | ICD-10-CM

## 2018-03-18 DIAGNOSIS — Z79899 Other long term (current) drug therapy: Secondary | ICD-10-CM

## 2018-03-18 LAB — CO2: Carbon dioxide:SCnc:Pt:Ser/Plas:Qn:: 30

## 2018-03-18 LAB — CARBAMAZEPINE LEVEL: Carbamazepine:MCnc:Pt:Ser/Plas:Qn:: 9.4

## 2018-03-18 LAB — CARBAMAZEPINE LEVEL, TOTAL: CARBAMAZEPINE LEVEL: 9.4 ug/mL (ref 4.0–12.0)

## 2018-03-18 LAB — BASIC METABOLIC PANEL
ANION GAP: 9 mmol/L (ref 9–15)
BLOOD UREA NITROGEN: 14 mg/dL (ref 7–21)
BUN / CREAT RATIO: 29
CALCIUM: 9.3 mg/dL (ref 8.5–10.2)
CHLORIDE: 91 mmol/L — ABNORMAL LOW (ref 98–107)
CO2: 30 mmol/L (ref 22.0–30.0)
CREATININE: 0.48 mg/dL — ABNORMAL LOW (ref 0.60–1.00)
EGFR CKD-EPI NON-AA FEMALE: >90 mL/min/{1.73_m2} (ref >=60)
GLUCOSE RANDOM: 92 mg/dL (ref 65–99)
POTASSIUM: 4.6 mmol/L (ref 3.5–5.0)
SODIUM: 130 mmol/L — ABNORMAL LOW (ref 135–145)

## 2018-03-18 NOTE — Unmapped (Signed)
Patient here for labs, pending appt for AWV.

## 2018-03-20 ENCOUNTER — Ambulatory Visit: Admit: 2018-03-20 | Discharge: 2018-03-21 | Payer: MEDICARE | Attending: Family Medicine | Primary: Family Medicine

## 2018-03-20 DIAGNOSIS — F320 Major depressive disorder, single episode, mild: Secondary | ICD-10-CM

## 2018-03-20 DIAGNOSIS — F329 Major depressive disorder, single episode, unspecified: Secondary | ICD-10-CM

## 2018-03-20 DIAGNOSIS — G35 Multiple sclerosis: Secondary | ICD-10-CM

## 2018-03-20 DIAGNOSIS — Z1159 Encounter for screening for other viral diseases: Secondary | ICD-10-CM

## 2018-03-20 DIAGNOSIS — F419 Anxiety disorder, unspecified: Secondary | ICD-10-CM

## 2018-03-20 DIAGNOSIS — Z0000 Encounter for general adult medical examination without abnormal findings: Principal | ICD-10-CM

## 2018-03-20 DIAGNOSIS — I10 Essential (primary) hypertension: Secondary | ICD-10-CM

## 2018-03-20 NOTE — Unmapped (Addendum)
Continue your present medicines.     Get Shingles and tetanus shots at the pharmacy.     Follow up in 6 months.     Please bring in a copy of your Advanced Directives to your next appointment.    Here is your personalized prevention plan based on your Annual Wellness Visit today.    Medicare Screening & Prevention Guidelines Recommendations Last Date Completed HM Status and Next Due Follow-Up   Colorectal Cancer Screening Patients 50 to 75: stool cards annually OR colonoscopy every 10 years (or more frequently if high risk) OR FIT-DNA every 3 years.  Colonoscopy date: 12/04/2014  FOBT/FIT date: Not Found  Sigmoidoscopy date: Not Found  FIT-DNA date: Not Found Health Maintenance Summary       Status Date      Colonoscopy Next Due 12/03/2024      Done 12/04/2014 HM COLONOSCOPY     Patient has more history with this topic...       Up to date   DEXA Bone Density Measurement Patients age 51-85 to have a DEXA every 5 years in postmenopausal women, males will defer to PCP. DEXA date: 02/28/2018 Health Maintenance Summary       Status Date      DEXA Scan-Start Age 67 Next Due 03/01/2023      Done 02/28/2018 DEXA BONE DENSITY SKELETAL     Patient has more history with this topic...       Up to date   Heart Disease Screening (fasting lipid panel) Minimum of every 5 years, patients age 52-75,  if no apparent signs or symptoms of heart disease. LDL date: 01/18/2018  Total choleseterol date: 01/18/2018  HDL date: 01/18/2018  Triglycerides date: 01/18/2018 Health Maintenance Summary       Status Date      Lipid Screening Next Due 01/19/2023      Done 01/18/2018 LIPID PANEL     Patient has more history with this topic...       Up to date   Mammogram Screening Age 23-74 every 2 years.  Mammogram date: Not Found Health Maintenance Summary       Status Date      Mammogram Start Age 33 Overdue 07/04/1999      Defer to PCP   Pelvic Exam & Pap Smear Women ages 44 to 5 every 3 years with negative cytology (pap smear)  OR,   women ages 74 to 60 every 5 years if they have had both a negative pap and human papillomavirus (HPV) OR,  Every 3 years if they had a positive HPV result Pap Smear date: Not Found  HPV date: Not Found Health Maintenance Summary     Patient has no health maintenance due at this time     Not within age range   Hepatitis C Screening A one-time screening for HCV infection for adults born between 20 & 1965. HCV screening date: Not Found Health Maintenance Summary       Status Date      Hepatitis C Screen Overdue 07/04/1999        Due, ordered today   Tdap Every 10 years (will not be covered by Medicare) DTap/Tdap/TD vaccination: Not Found Health Maintenance Summary       Status Date      DTaP/Tdap/Td Vaccines Overdue 07/03/1968      Due, please inquire at your pharmacy     Influenza Vaccine Annually  Influenza vaccination: Not Found   Health Maintenance Summary  Status Date      Influenza Vaccine Next Due 04/21/2018        Due fall 2019   Prevnar and Pneumovax Vaccines Prevnar given at age 9 and Pneumovax given one year later. These vaccines may be given in a different sequence depending on chronic conditions. (utilize BPA for dosing & administration) Pneumonia vaccination: 06/12/2017   Health Maintenance Summary       Status Date      Pneumococcal Vaccines Next Due 06/12/2018      Done 06/12/2017 Imm Admin: Pneumococcal Conjugate 13-Valent       Up to date   Zoster Vaccine Once at age 15 or older (may not be covered by Medicare).  Zoster vaccination: Not Found   Health Maintenance Summary       Status Date      Zoster Vaccines Overdue 07/04/1999      Due for Shingrix vaccines, please inquire at your pharmacy     Diabetes Screening  Annually for patients 40-70, if risk factors (family hx of DM, hx of gestational DM, and/or PCOS), twice per year if diagnosed with pre-diabetes.    Range 65-99 Diabetes screening date: 02/19/18 CMP N/A Up to date

## 2018-03-20 NOTE — Unmapped (Signed)
ADVANCE CARE PLANNING NOTE    Discussion Date:  March 20, 2018    Patient has decisional capacity:  Yes    Patient has selected a Health Care Decision-Maker if loses capacity: Yes  Name:  Laqueta Jean (son) and 2 other children   Contact Information:  [phone]      Basis of health care decision-maker's authority?: health care power of attorney or other advance directive   [Please update this selection at each admission]    Discussion Participants:  Pt and Clinical biochemist of Medical Status/Prognosis:       Communication of Treatment Goals/Options:       Treatment Decisions:   Pt has an Scientist, water quality and I encouraged her to bring in a copy to her next appt.    I spent between 1-15 minutes providing voluntary advance care planning services for this patient.

## 2018-03-20 NOTE — Unmapped (Signed)
Assessment:        1. Medicare annual wellness visit, subsequent    2. Essential hypertension    3. Multiple sclerosis (CMS-HCC)    4. 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 Anxiety and depression            Plan:        Patient instructions:  Continue your present medicines.     Get Shingles and tetanus shots at the pharmacy.     Follow up in 6 months.     Please bring in a copy of your Advanced Directives to your next appointment.    Barriers to goals identified and addressed. Pertinent handouts were given today and reviewed with the patient as indicated.  The Care Plan and Self-Management goals have been included on the AVS and the AVS has been printed. Any outside resources or referrals needed at this time are noted above.  Any new medications prescribed have been discussed, and side effects have been addressed. Have assessed the patient's understanding, response, and barriers to adherence to medications. Patient voiced understanding and all questions have been answered to satisfaction.        Subjective:     Ashlee Christian is a 67 y.o. female.    HPI:  She is here for annual Medicare wellness visit as well as follow-up of chronic medical concerns.  Wellness portion of exam done by social worker was reviewed.    Regarding her hypertension,    She does have side effects to the medication.   Blood pressures at home are not checked.    She is not exercising and is not adherent to a low-salt diet.   She has on average 5 servings of fruits and vegetables daily.  Alcohol intake is 0 drinks per week.    She does not have pain in the calves on walking.  She does not have regular headaches.  She does have shortness of breath.  She does not have chest pain  She does not have swelling in the legs.  She does have cough.  She does not have dizziness    Regarding her anxiety and depression she continues to be followed by Psychiatry.      Regarding her MS she continues to be followed by neurology.  Regarding her unsteady gait and history of fall she has been seen by home health physical therapy and she continues to do her exercises.    Regarding breast cancer screening she had bilateral mastectomies due to fibrocystic disease when she was younger and she has no breast tissue.  Breasts were reconstructed using her abdominal fat.    The following portions of the patient's history were reviewed and updated as appropriate: allergies, current medications, past family history, past medical history, past social history, past surgical history and problem list.    Review of Systems  Pertinent items are noted in HPI.     Constitutional: + Excess fatigue  Eyes: Denies blurred or changing vision, eye pain, or irritation.  ENT: Denies ear pain, changes in hearing, nasal/sinus problems, sneezing, or sore throat.  Respiratory: + cough + SOB  Cardiovascular: Denies chest pain/pressure, palpitations, syncope, or edema.  Gastrointestinal: + Nausea + Vomiting  Breast: Denies breast pain, breast lumps, nipple discharge, or other breast changes.  Genitourinary:+ Urinary Frequency  Musculoskeletal: Denies new pain or swelling in joints, or new or unusual muscle pain  Skin/Nails/Hair: Denies rash, itching, hair loss, new/changing lesions,  or slow-healing wounds.  Neurological: + Numbness + Tingling  Mental Health: + Sleep disturbance + Depression + Anxiety or panic symptoms  Endocrine: Denies excessive urination or thirst, unexpected weight change, or heat/cold intolerance.  Hematologic: Denies unusual bleeding/bruising or lymphadenopathy.  Allergy: Denies seasonal symptoms, sneezing, itchy/watery eyes, hives, or lip swelling.       Objective:      Physical Exam:  VITAL SIGNS: BP 130/82  - Pulse 76  - Temp 36.9 ??C (98.5 ??F) (Oral)  - Resp 18  - Ht 167.9 cm (5' 6.1)  - Wt 61.8 kg (136 lb 3.2 oz)  - BMI 21.92 kg/m??    Wt Readings from Last 6 Encounters:   03/20/18 61.8 kg (136 lb 3.2 oz)   03/11/18 61.1 kg (134 lb 12.8 oz)   01/18/18 61 kg (134 lb 6.4 oz)   12/24/17 61.2 kg (135 lb)   10/11/17 62.7 kg (138 lb 3.2 oz)   07/11/17 62.6 kg (138 lb)     GENERAL: Well appearing, alert, in NAD  HEAD: Normocephalic and atraumatic  EYES: No exophthalmos. EOMI. Lids- No crusting or drainage. Conjunctiva- clear. Sclera- anicteric. PER  EARS: Auricles normal. EACs without excess cerumen and without redness. TMs normal. Hearing grossly intact   NOSE/MOUTH/THROAT: No nasal deformity.  Nares are patent w/o discharge.  No oral lesions.  Mucous membranes moist  NECK: Supple w/o adenopathy, thyromegaly or masses  LYMPHATIC:  No enlarged cervical or supraclavicular lymph nodes.  CHEST: Clear to auscultation bilaterally.  Respirations are unlabored.  No chest wall deformity or tenderness.  HEART: RRR w/o murmurs  VASCULAR: No carotid, abdominal, or femoral bruits.  Pedal pulses intact. No edema  ABDOMEN: Soft, non-distended, non-tender, no organomegaly, no masses  GENITOURINARY: No CVAT, suprapubic tenderness, inguinal hernia, penile lesions or discharge  BACK/SPINE: No kyphosis, scoliosis or tenderness.  MUSCULOSKELETAL: No joint or muscle tenderness, deformity or swelling. ROM is normal  INTEGUMENT: Skin is warm and dry.  No rashes or concerning skin lesions.  NEUROLOGIC:  Alert, oriented to person, place and time. Cr N II-XII intact. Normal speech and strength. No focal findings  PSYCHOLOGIC: No evidence of anxiety or depression.  Normal mood.  Appropriate affect    Laboratory Values:  Results for orders placed or performed in visit on 03/18/18   Basic Metabolic Panel   Result Value Ref Range    Sodium 130 (L) 135 - 145 mmol/L    Potassium 4.6 3.5 - 5.0 mmol/L    Chloride 91 (L) 98 - 107 mmol/L    CO2 30.0 22.0 - 30.0 mmol/L    Anion Gap 9 9 - 15 mmol/L    BUN 14 7 - 21 mg/dL    Creatinine 1.30 (L) 0.60 - 1.00 mg/dL    BUN/Creatinine Ratio 29     EGFR CKD-EPI Non-African American, Female >90 >=60 mL/min/1.108m2    EGFR CKD-EPI African American, Female >90 >=60 mL/min/1.35m2    Glucose 92 65 - 99 mg/dL    Calcium 9.3 8.5 - 86.5 mg/dL   Carbamazepine level, total   Result Value Ref Range    Carbamazepine Level 9.4 4.0 - 12.0 ug/mL

## 2018-03-20 NOTE — Unmapped (Signed)
This auto-generated note displays abnormal results identified during the AWV Assessments. For full results, please see the Flowsheet Links under the Additional Documentation section of this encounter in Chart Review.      General Health:  Patient answered (!) Fair(I was doing pretty well until we started switching my medications) to self health assessment  inform PCP    Pain identified during today's visit        03/20/18 1431   PainSc:   5    PCP notified     Safety:  Based on the patients age, 67 y.o., the TUG test was abnormal: 15(due to MS, right leg drags) seconds  The patient answered (!) Yes(fell in April, missed a step and twisted her ankle) to falling in the last year, and (!) Yes(feels more steady with new orthotic, uses a cane sometimes) to feeling unsteady while standing or walking.  PCP notified  Falls Assessment Review    Fall Risk Assessment was positive.  Falls risk interventions taken today were:  ?? Discussed interventions in home e.g. Shower chairs, grab bars, no loose rugs in the home  ?? Falls prevention education included in AVS   ?? Provider referred for PT      Psychosocial Assessment:  Patient expressed that they or a family member has concerns about their memory  Inform PCP    Patient answered one or more psychosocial assessments abnormally  Do you feel stress - tense, restless, nervous, or anxious, or unable to sleep at night because your mind is troubled all the time? Never or Almost Never   Do you feel that you have family and friends that can support you?      Do you express feelings of anger and frustration in ways that are hurtful to yourself or others? Never or Almost Never    Do you feel generally tired or fatigue? (!) Always    Inform PCP    PHQ-9:    Patient had a Mild PHQ-9 score of 9  Inform PCP      Barriers to Care From History:  Caregiver burden Unanswered   Cognitive Impairment Unanswered   Falls Risk Unanswered   Financial difficulty Unanswered   Frail Elderly Unanswered Hearing impairment/loss Unanswered   Homeless Unanswered   Impaired mobility Unanswered   Inadequate social/family support Unanswered   Ineffective family coping Unanswered   Low Literacy Unanswered   Nonadherence to medication Unanswered   Non-english speaking Unanswered   Terminal Illness/Hospice Unanswered   Transportation barriers Unanswered   Visual impairment Unanswered       Here is your personalized prevention plan based on your Annual Wellness Visit today.    Medicare Screening & Prevention Guidelines Recommendations Last Date Completed HM Status and Next Due Follow-Up   Colorectal Cancer Screening Patients 50 to 75: stool cards annually OR colonoscopy every 10 years (or more frequently if high risk) OR FIT-DNA every 3 years.  Colonoscopy date: 12/04/2014  FOBT/FIT date: Not Found  Sigmoidoscopy date: Not Found  FIT-DNA date: Not Found Health Maintenance Summary       Status Date      Colonoscopy Next Due 12/03/2024      Done 12/04/2014 HM COLONOSCOPY     Patient has more history with this topic...       Up to date   DEXA Bone Density Measurement Patients age 19-85 to have a DEXA every 5 years in postmenopausal women, males will defer to PCP. DEXA date: 02/28/2018 Health Maintenance Summary  Status Date      DEXA Scan-Start Age 63 Next Due 03/01/2023      Done 02/28/2018 DEXA BONE DENSITY SKELETAL     Patient has more history with this topic...       Up to date   Heart Disease Screening (fasting lipid panel) Minimum of every 5 years, patients age 68-75,  if no apparent signs or symptoms of heart disease. LDL date: 01/18/2018  Total choleseterol date: 01/18/2018  HDL date: 01/18/2018  Triglycerides date: 01/18/2018 Health Maintenance Summary       Status Date      Lipid Screening Next Due 01/19/2023      Done 01/18/2018 LIPID PANEL     Patient has more history with this topic...       Up to date   Mammogram Screening Age 51-74 every 2 years.  Mammogram date: Not Found Health Maintenance Summary       Status Date      Mammogram Start Age 79 Overdue 07/04/1999      Defer to PCP   Pelvic Exam & Pap Smear Women ages 79 to 32 every 3 years with negative cytology (pap smear)  OR,   women ages 109 to 24 every 5 years if they have had both a negative pap and human papillomavirus (HPV) OR,  Every 3 years if they had a positive HPV result Pap Smear date: Not Found  HPV date: Not Found Health Maintenance Summary     Patient has no health maintenance due at this time     Not within age range   Hepatitis C Screening A one-time screening for HCV infection for adults born between 37 & 1965. HCV screening date: Not Found Health Maintenance Summary       Status Date      Hepatitis C Screen Overdue 07/04/1999        Due, ordered today   Tdap Every 10 years (will not be covered by Medicare) DTap/Tdap/TD vaccination: Not Found Health Maintenance Summary       Status Date      DTaP/Tdap/Td Vaccines Overdue 07/03/1968      Due, please inquire at your pharmacy     Influenza Vaccine Annually  Influenza vaccination: Not Found   Health Maintenance Summary       Status Date      Influenza Vaccine Next Due 04/21/2018        Due fall 2019   Prevnar and Pneumovax Vaccines Prevnar given at age 24 and Pneumovax given one year later. These vaccines may be given in a different sequence depending on chronic conditions. (utilize BPA for dosing & administration) Pneumonia vaccination: 06/12/2017   Health Maintenance Summary       Status Date      Pneumococcal Vaccines Next Due 06/12/2018      Done 06/12/2017 Imm Admin: Pneumococcal Conjugate 13-Valent       Up to date   Zoster Vaccine Once at age 68 or older (may not be covered by Medicare).  Zoster vaccination: Not Found   Health Maintenance Summary       Status Date      Zoster Vaccines Overdue 07/04/1999      Due for Shingrix vaccines, please inquire at your pharmacy     Diabetes Screening  Annually for patients 40-70, if risk factors (family hx of DM, hx of gestational DM, and/or PCOS), twice per year if diagnosed with pre-diabetes.    Range 65-99 Diabetes screening date: 02/19/18 CMP N/A Up  to date

## 2018-03-21 MED ORDER — DIMETHYL FUMARATE 240 MG CAPSULE,DELAYED RELEASE
ORAL_CAPSULE | Freq: Two times a day (BID) | ORAL | 5 refills | 0.00000 days | Status: CP
Start: 2018-03-21 — End: 2018-09-10
  Filled 2018-04-18: qty 60, 30d supply, fill #0

## 2018-03-21 MED ORDER — DIMETHYL FUMARATE 240 MG CAPSULE,DELAYED RELEASE: 1 | capsule | Freq: Two times a day (BID) | 5 refills | 0 days | Status: AC

## 2018-03-22 NOTE — Unmapped (Signed)
[facility] Refill Coordination Note  Specialty Medication(s): Tecfidera 240mg     IMA HAFNER, DOB: 29-Jul-1951  Phone: [phone] (home) , Alternate phone contact: N/A  Phone or address changes today?: No  All above HIPAA information was verified with patient.  Shipping Address: [address]  [place] [address]   Insurance changes? No    Completed refill call assessment today to schedule patient's medication shipment from the [facility] [phone]).      Confirmed the medication and dosage are correct and have not changed: Yes, regimen is correct and unchanged.    Confirmed patient started or stopped the following medications in the past month:  No, there are no changes reported at this time.    Are you tolerating your medication?:  Marilouise reports tolerating the medication.    ADHERENCE  Did you miss any doses in the past 4 weeks? No missed doses reported.    FINANCIAL/SHIPPING    Delivery Scheduled: Yes, Expected medication delivery date: 03/26/2018     The patient will receive an [facility]SI print out for each medication shipped and additional FDA Medication Guides as required.  Patient education from [facility] or [name] may also be included in the shipment    Damani did not have any additional questions at this time.    Delivery address validated in FSI scheduling system: Yes, address listed in FSI is correct.    We will follow up with patient monthly for standard refill processing and delivery.      Thank you,  Diannah Rindfleisch  Anders Grant   [facility] Specialty Pharmacist

## 2018-03-25 MED ORDER — CLONAZEPAM 0.5 MG TABLET
ORAL_TABLET | Freq: Three times a day (TID) | ORAL | 2 refills | 0.00000 days | Status: CP
Start: 2018-03-25 — End: 2018-04-05

## 2018-03-25 MED FILL — TECFIDERA/240MG/CPDR: TECFIDERA/240MG/CPDR | 30 days supply | Qty: 60 | Fill #0

## 2018-03-26 NOTE — Unmapped (Signed)
Refilled clonazepam- Temazepam not any more on the medication list , plase see pt's last mychart message.    Ashlee Christian

## 2018-03-26 NOTE — Unmapped (Signed)
Reviewed PDMP report prior to prescribing clonopin.   Ashlee Christian

## 2018-04-02 NOTE — Unmapped (Signed)
FYI

## 2018-04-02 NOTE — Unmapped (Signed)
08.13.19 at 10:05am   Call center call     [facility] patient     Pt called regarding has sent several Mychart messages, and no response.  Pt calling regarding Clonzaepam and breathing, states that the doseage is far less that what was discussed in office visit.  Pt states that her Psychiatrist called her to ask how she was doing, she informed them she was not doing well. Pt states that the psychiatrist says for her to go back to to the original dose    Best contact phone to discuss this further is [phone]    Annabelle Harman

## 2018-04-02 NOTE — Unmapped (Signed)
Dr. Johnnye Lana,     Please see note below from call center/patient    Thanks, Lowella Bandy

## 2018-04-05 MED ORDER — CLONAZEPAM 0.5 MG TABLET
ORAL_TABLET | 2 refills | 0 days | Status: CP
Start: 2018-04-05 — End: 2018-04-23

## 2018-04-05 NOTE — Unmapped (Signed)
Edwina/Dr. Johnnye Lana,    Please see the message that was sent through myChart from Ms. Shiela Mayer. She has been calling regarding this issue since last week.    Thank you,  Latanya Presser

## 2018-04-06 NOTE — Unmapped (Signed)
Called the patient.    She is taking: Carbamazepine 400 mg in the morning, 200 mg at lunchtime and 400 mg at bedtime, gabapentin 300 mg TID and Klonopin 05 mg TID.  She states she is not taking temezepam any more and not taking narcotics.  States she is having bad squeezing around the chest.    Requested to increase the dose of Klonopin.    I reminded her that at the last visit with me the dose of Klonopin was actually increased to 0.5 mg TID from 0.5 mg BID (She used to take 1 mg TID in the past, but the dose was decreased by her other provider).    I agreed that she can increase further (she has enough pills at home, picked up the refill of 0.5 mg TID on 03/26/2018) to 0.5 mg in the morning, 0.5 mg at lunchtime and 1 mg at bedtime.     I placed a new Rx for Klonopin to cover this regimen with a note to disregard previous  Klonopin  prescriptions. I suggested that she uses all her pills that she has at home prior to picking up the next Klonopin refill.  She voiced understanding.    Sharmon Revere Dujmovic Basuroski

## 2018-04-12 NOTE — Unmapped (Signed)
Dr Johnnye Lana - patient just called to report that she was seen at a [facility] in [place].      Xray done - lungs were hyper inflated with inflammation - cough, dyspnea     Prescription given for short course of prednisone and some kind of bronchial dilator, cough medicine.      Was told to follow up with neurologist if not improved in a couple of days or if she worsens go to the ER.      If you need copes of the reports/xray our office could request copies by contacting [facility].     [facility]  [address] [place] [address]  Ph [phone]    Thanks, [place]

## 2018-04-12 NOTE — Unmapped (Signed)
Notified the patient that Dr. Johnnye Lana advices that she go to her local Urgent Care. Patient verbalized her understanding.

## 2018-04-12 NOTE — Unmapped (Signed)
Edwina,    Please call the patient to go to the urgent care.   Thank you,  Sharmon Revere

## 2018-04-12 NOTE — Unmapped (Signed)
Please see message from patient below...

## 2018-04-12 NOTE — Unmapped (Signed)
Call Center Message, 04/12/2018, 11:53 AM    Nikki:    [name], MR# [id number], called to make sure Dr. Johnnye Lana sees her MyChart Email Message that she sent today.  Her breathing problems are worse. She can inhale air pretty easily, but she has trouble exhaling.  Please advise.  Phone:  [phone]

## 2018-04-12 NOTE — Unmapped (Signed)
Patient states she suffers from tightness in her chest from the MS. She has suffered from this for several years. She feels that in last three months it has gotten worst. She can is breathing in well but having problems releasing the air. Then she would have coughing spasms. Patient stated her Jaynie Bream four times a day has helped in the past but is not helping as much now. It does help her stress level but not the MS huge. Patient can be reached on [phone]. Please advise.   Patient denies any congestion. Her throat and chest is dry. She denies any infection. She has been staying cool.

## 2018-04-15 NOTE — Unmapped (Signed)
Patient would like to come in to see Dr. Johnnye Lana. She stated her symptoms had improved a little bit since she got the inhaler and po steroids from the Urgent Care. Prednisone ends today. Patient has been given a cough medication called Benzonatate 200 mg tid. Still experiencing problems with exhalation. I have advised the patient to seek emergency help if she gets worse while waiting for her appointment with Dr. Johnnye Lana. Patient verbalized her understanding. Patient will be scheduled for 0800 on 04/23/2018. Called [facility] and requested a copy of her Xray to be sent to [place].

## 2018-04-15 NOTE — Unmapped (Signed)
Patient called states she feels she needs to be seen. She would like to discuss further treatment.     Please call patient [phone]

## 2018-04-15 NOTE — Unmapped (Signed)
Please see patient's message below and advise.     Ashlee Christian

## 2018-04-16 NOTE — Unmapped (Signed)
[facility] Refill Coordination Note  Specialty Medication(s): TECFIDERA  Additional Medications shipped: NONE    Ashlee Christian, DOB: 1951-07-17  Phone: [phone] (home) , Alternate phone contact: N/A  Phone or address changes today?: No  All above HIPAA information was verified with patient.  Shipping Address: [address]  [place] [address]   Insurance changes? No    Completed refill call assessment today to schedule patient's medication shipment from the [facility] [phone]).      Confirmed the medication and dosage are correct and have not changed: Yes, regimen is correct and unchanged.    Confirmed patient started or stopped the following medications in the past month:  No, there are no changes reported at this time.    Are you tolerating your medication?:  [name] reports tolerating the medication.    ADHERENCE    NOT PART B  Did you miss any doses in the past 4 weeks? No missed doses reported.    FINANCIAL/SHIPPING    Delivery Scheduled: Yes, Expected medication delivery date: 04/19/18 VIA UPS     The patient will receive a drug information handout for each medication shipped and additional FDA Medication Guides as required.      Daija did not have any additional questions at this time.    Delivery address validated in Epic.    We will follow up with patient monthly for standard refill processing and delivery.      Thank you,  Thad Ranger   [facility] Specialty Pharmacist

## 2018-04-17 NOTE — Unmapped (Signed)
Patient has already been scheduled to see you on 9.3    [facility]

## 2018-04-17 NOTE — Unmapped (Signed)
Thanks

## 2018-04-18 MED FILL — TECFIDERA 240 MG CAPSULE,DELAYED RELEASE: 30 days supply | Qty: 60 | Fill #0 | Status: AC

## 2018-04-23 ENCOUNTER — Ambulatory Visit: Admit: 2018-04-23 | Discharge: 2018-04-24 | Payer: MEDICARE | Attending: Neurology | Primary: Neurology

## 2018-04-23 DIAGNOSIS — M792 Neuralgia and neuritis, unspecified: Secondary | ICD-10-CM

## 2018-04-23 DIAGNOSIS — D72818 Other decreased white blood cell count: Secondary | ICD-10-CM

## 2018-04-23 DIAGNOSIS — R0989 Other specified symptoms and signs involving the circulatory and respiratory systems: Secondary | ICD-10-CM

## 2018-04-23 DIAGNOSIS — G35 Multiple sclerosis: Principal | ICD-10-CM

## 2018-04-23 DIAGNOSIS — E871 Hypo-osmolality and hyponatremia: Secondary | ICD-10-CM

## 2018-04-23 DIAGNOSIS — G4700 Insomnia, unspecified: Secondary | ICD-10-CM

## 2018-04-23 DIAGNOSIS — F419 Anxiety disorder, unspecified: Secondary | ICD-10-CM

## 2018-04-23 DIAGNOSIS — R05 Cough: Secondary | ICD-10-CM

## 2018-04-23 LAB — COMPREHENSIVE METABOLIC PANEL
ALBUMIN: 4.1 g/dL (ref 3.5–5.0)
ALKALINE PHOSPHATASE: 60 U/L (ref 38–126)
ALT (SGPT): 19 U/L (ref 15–48)
ANION GAP: 5 mmol/L — ABNORMAL LOW (ref 9–15)
AST (SGOT): 18 U/L (ref 14–38)
BILIRUBIN TOTAL: 0.2 mg/dL (ref 0.0–1.2)
BLOOD UREA NITROGEN: 17 mg/dL (ref 7–21)
BUN / CREAT RATIO: 37
CALCIUM: 8.6 mg/dL (ref 8.5–10.2)
CHLORIDE: 93 mmol/L — ABNORMAL LOW (ref 98–107)
CO2: 31 mmol/L — ABNORMAL HIGH (ref 22.0–30.0)
CREATININE: 0.46 mg/dL — ABNORMAL LOW (ref 0.60–1.00)
EGFR CKD-EPI AA FEMALE: >90 mL/min/{1.73_m2} (ref >=60)
EGFR CKD-EPI NON-AA FEMALE: >90 mL/min/{1.73_m2} (ref >=60)
GLUCOSE RANDOM: 84 mg/dL (ref 65–179)
PROTEIN TOTAL: 6.5 g/dL (ref 6.5–8.3)
SODIUM: 129 mmol/L — ABNORMAL LOW (ref 135–145)

## 2018-04-23 LAB — CBC W/ AUTO DIFF
BASOPHILS RELATIVE PERCENT: 0.3 %
EOSINOPHILS ABSOLUTE COUNT: 0.1 10*9/L (ref 0.0–0.4)
EOSINOPHILS RELATIVE PERCENT: 2.8 %
HEMATOCRIT: 37.3 % (ref 36.0–46.0)
LARGE UNSTAINED CELLS: 2 % (ref 0–4)
LYMPHOCYTES ABSOLUTE COUNT: 0.5 10*9/L — ABNORMAL LOW (ref 1.5–5.0)
LYMPHOCYTES RELATIVE PERCENT: 12.3 %
MEAN CORPUSCULAR HEMOGLOBIN: 32 pg (ref 26.0–34.0)
MEAN CORPUSCULAR VOLUME: 95.2 fL (ref 80.0–100.0)
MEAN PLATELET VOLUME: 7.1 fL (ref 7.0–10.0)
MONOCYTES ABSOLUTE COUNT: 0.3 10*9/L (ref 0.2–0.8)
MONOCYTES RELATIVE PERCENT: 8.1 %
NEUTROPHILS ABSOLUTE COUNT: 2.9 10*9/L (ref 2.0–7.5)
NEUTROPHILS RELATIVE PERCENT: 74.5 %
PLATELET COUNT: 242 10*9/L (ref 150–440)
RED BLOOD CELL COUNT: 3.92 10*12/L — ABNORMAL LOW (ref 4.00–5.20)
RED CELL DISTRIBUTION WIDTH: 12.6 % (ref 12.0–15.0)
WBC ADJUSTED: 3.8 10*9/L — ABNORMAL LOW (ref 4.5–11.0)

## 2018-04-23 LAB — LARGE UNSTAINED CELLS: Lab: 2

## 2018-04-23 LAB — HEPATITIS C ANTIBODY: Hepatitis C virus Ab:PrThr:Pt:Ser:Ord:: NONREACTIVE

## 2018-04-23 LAB — PROTEIN TOTAL: Protein:MCnc:Pt:Ser/Plas:Qn:: 6.5

## 2018-04-23 LAB — C-REACTIVE PROTEIN: C reactive protein:MCnc:Pt:Ser/Plas:Qn:: 6.1

## 2018-04-23 LAB — ERYTHROCYTE SEDIMENTATION RATE: Lab: 3

## 2018-04-23 MED ORDER — ALBUTEROL SULFATE HFA 90 MCG/ACTUATION AEROSOL INHALER
RESPIRATORY_TRACT | 0 refills | 0 days | Status: CP
Start: 2018-04-23 — End: 2018-06-21

## 2018-04-23 MED ORDER — BENZONATATE 200 MG CAPSULE
ORAL_CAPSULE | Freq: Three times a day (TID) | ORAL | 0 refills | 0.00000 days | Status: CP | PRN
Start: 2018-04-23 — End: 2018-06-11

## 2018-04-23 MED ORDER — CLONAZEPAM 0.5 MG TABLET
ORAL_TABLET | 2 refills | 0 days | Status: CP
Start: 2018-04-23 — End: 2018-08-10

## 2018-04-23 NOTE — Unmapped (Addendum)
1) Please see you PCP for the further follow up of your lung issues  2) we also placed a referral for the lung doctor.   3) We increased you Klonopin to 1 mg in the morning and 0.5 mg at lunchtime and 1 mg at bedtime. Please make sure that you do not take narcotics  4) Continue with your other medications as prescribed.   5) Please schedule with me in 6 months, sooner if needed    ?   In case of:  ? a suspected relapse (new symptoms or worsening existing symptoms, lasting for >24h)  OR  ? a need for an additional appointment for other reasons     Please contact:    [facility] Desk  Phone: [phone]          Sarita Bottom, MD  Clinical Associate Professor of Neurology  [facility], Department of Neurology  Multiple Sclerosis/Neuroimmunology Division  [address] [place], [place], [place] [address]

## 2018-04-23 NOTE — Unmapped (Signed)
[facility]  Multiple Sclerosis/Neuroimmunology Division  Sarita Bottom, MD  Associate Professor of Neurology    DATE OF VISIT: 04/23/2018  Re:  Ashlee Christian  [address]  [place] [address]  MRN: [id number]  DOB: 1951-01-20      Direct entry by: Dr. Sarita Bottom    Visit: Follow-up      REASON FOR VISIT: [name], a 67 y.o. Caucasian right handed female, is seen in consultation at the [facility], Multiple Sclerosis/Neuroimmunology Division for the follow-up on multiple sclerosis.[name] was last time seen at the office visit with me on 03/11/2018  She comes with her son and agrees that he is present.     Assessment:     1. I took a detailed interval history from [name].  2. I personally reviewed the patient's interval medical records.    3. I performed neurological examination.  4. [name] agreed with the recommended diagnostic and treatment plan.                                                                                                                                             ?? Multiple sclerosis:  [name] was diagnosed with multiple sclerosis many years ago. The disease evolution is since her age of 47,  the disease follows a slowly secondary-progressive course. She was treated with Copaxone and switched to Tecfidera on 09/04/2017 due to disease activity. Please review my initial clinic note on 10/13/2016 and subsequent clinic notes for details on the HPI.     Her 02/05/2018 were done as a re-baseline for further comparisons, after the start with Tecfidera. Her spinal cord lesions are stable compared to prior, but her brain MRI shows new lesions compared with 2017, which was done before the start with Tecfidera, so those new lesions could have occurred before Tecfidera was started. Therefore, I would not suggest treatment switch at this stage.  She will continue with Tecfidera.    She increased  carbamazepine for the better management of her MS hugs, and this was done with caution given low sodium. Carbamazepine may cause low sodium levels, but the patient is having those levels at the relatively stable levels since July 2018.     In addition, the patient requested to increase her dose of Klonopin wince it was also helping her with MS hugs and after the decrease in Klonopin those symptoms became very disturbing for her. We  Increased Klonopin to 1 mg in the morning and 0.5 mg at lunchtime and 1 mg at bedtime. This will also help with her anxiety. She stopped taking Temazepam. I reviewed PDMP report.   Her lung hyperinflation does not seem to be related to her neurological disease, given the  fact that it is associated with cough, chest X rays done on 04/26/2018 do not suggest diaphragm paralysis. To me it is more suggestive of an obstructive lung disease.  I checked NMO-IgG since it was not checked before.   Leukopenia is likely induced by Tecfidera and will be monitored.      Please see the detailed plan below.      ?? Other co-morbidities:  To be followed by PCP. Follow-up with the psychiatry for the management of anxiety and depression.     Plan:     1) Continue tecfidera 240 mg two times a day  2) Continue carbamazepine  400 mg in the morning -200 mg in the afternoon -400 mg in the evening. \  3) Continue baclofen 20 mg TID  4) Continue gabapentin 300 mg three times a day: chronic pain management  5) may increase Klonopin to 1 mg in the morning and 0.5 mg at lunchtime and 1 mg at bedtime.   6)  Continue  Vitamin D to 4000 U/day, target Vitamin D  levels to be around 50ng/mL: for Vitamin D insufficiency  7) Follow-up with the PCP for the further follow up of lung problems (I refilled albuterol and tessalon)  8) referral for pulmonology   9) Please schedule with me in 6 months, sooner if needed (will be seen at [facility]) 10) CBC/diff, CMP, CRP, NMO-IgG, ESR.   11) CBC/diff and CMP: in 3 months with the PCP. Admin asked to coordinate.       Subjective:     HISTORY OF PRESENT ILLNESS:  [name] was diagnosed with MS. Her first symptoms started at her age of 78 with right leg weakness and left sided facial sensory disturbances (hypersensitivity).   Please review my initial clinic note on 10/13/2016 and subsequent clinic notes  for details on the HPI.      INTERVAL HISTORY: She was  seen at the local UC  on 04/12/2018  For breating problems, started on an inhaler and tessalon, that helped. X rays showed lung hyperinflation. She was also given a  short course of oral  Prednisone. Now feels better but not completely. Would like to further increase Klonopin since it is helping her with the MS hugs. Takes currently Klonopin 0.5 in the morning, 0.5 at lunch and 1 mg at bedtime, that also helps with sleep. She stopped taking Temazepam and denies use of opioids.     MS DMD history:  She received Copaxone 2009 until 07/2009 and then discontinued therapy due to disease stability. She restarted Copaxone 05/2015 and discontinued 01/20/2016 due to anxiety.  In 09/2016 [name] voiced that she would like to continue with Copaxone, but due to  financial issues, at that time she was not able to re-start the treatment, so she agreed to start with Rebif 22mg  three times a week. However, after the grant options  for Copaxone coverage were re-opened and [name] started with  Copaxone in 10/2016. She was switched to Tecfidera on  09/04/2017 due to disease activity. Now tolerates it well.         ............................................................................................................................................[name]  DIAGNOSTIC STUDIES / REVIEW OF RECORDS:    Prior medical records: [facility], [facility] [place] [facility] [place]    MRI:  10/16/07: Brain MRI with and without contrast: REPORT: Multiple bilateral subcortical and deep white matter areas of increased T2 and FLAIR signal.   05/27/08:Brain MRI with and without contrast: REPORT: Stable  number and distribution of the bilateral white matter signal abnormalities. ??No new lesions or enhancing lesions Identified.  07/28/09: Brain MRI with and without contrast: REPORT: Stable number and distribution of the bilateral white matter signal abnormalities. ??No new lesions or enhancing lesions Identified.  01/16/12: Brain MRI  with and without contrast: REPORT: No new findings. ??Scattered deep white matter FLAIR  hyperintensities are unchanged in size and distribution. ??No enhancement to suggest active demyelination.   09/17/2013: Brain MRI  with and without contrast: REPORT: Unchanged, scattered deep FLAIR hyperintense white matter lesions. No new lesions. No abnormal enhancement. Normal optic nerves.  06/18/15: Brain MRI  with and without contrast: REPORT: Multiple white matter lesions which are unchanged in size and distribution compared to study dated 09/16/13.No new enhancing lesions.  06/13/2016: Brain MRI  with and without contrast: REPORT: No significant change in the number or distribution of the scattered white matter T2 hyperintense lesions. No enhancing lesions.    07/26/2016:Cervical spine MRI with and without contrast: REPORT: There is grade 1 retrolisthesis of C4 on C5. There is reversal of the normal cervical lordosis. No convincing foci of signal abnormality are noted within the cervical cord. There is a nonenhancing T2 bright/T1 dark focus in the C3 vertebral body, may represent an atypical hemangioma. There is no abnormal enhancement within the cervical spine. C2-C3: No significant spinal canal or neural foraminal narrowing. The lateral facet hypertrophy.C3-C4: No significant spinal canal or neural foraminal narrowing. Bilateral facet hypertrophy. C4-C5: Ligamentum flavum hypertrophy. There is a disc bulge with moderate spinal canal and to severe bilateral neural foraminal narrowing, secondary to disc osteophyte complex.C5-C6: There is a disc bulge with moderate spinal canal and mild bilateral neural foraminal narrowing.C6-C7: There is a disc bulge with mild spinal canal narrowing, secondary to disc osteophyte complex. Mild significant neural foraminal narrowing.C7-T1: No significant spinal canal or neural foraminal narrowing.  05/08/2017: Cervical spine MRI without contrast: REPORT (COMPARISON: CT spine 04/25/2017 and MRI 03/03/2017):Sequelae of prior ACDF extending from C4-C7. No fractures or perihardware abnormality.  -Multiple levels with severe neural foraminal stenosis worst at C4-5. This is unchanged from MRI 03/03/2017. - No spinal canal stenosis.- No cord signal abnormality.  06/11/2017: C/T/L spine MRI without contrast, REPORT (COMPARISON: Cervical spine radiographs dated 06/05/2017, MRI cervical spine dated 05/08/2017, MRI thoracic spine dated 07/26/2016):Stable thoracic cord lesion at T10-T11. No new lesions in the cervical or thoracic spine. - Status post C4-C7 ACDF with unchanged mild canal narrowing at C3-C4 and C5-C6. Severe neural foraminal narrowing is unchanged. - Lumbar spondylosis worst at L5-S1 with central disc extrusion and mild left neural foraminal narrowing. Marked hypertrophic changes of the facets at L4-L5 with a 6 mm left-sided medially projecting synovial cyst which mildly displaces the descending nerve roots.  10/16/07: Thoracic spine MRI with and without contrast: REPORT: Incidental left renal cystic lesion, otherwise normal MRI of the total spine.  07/26/2016:Thoracic spine MRI with and without contrast: REPORT:The vertebral bodies are normally aligned. As a single focus of increased cord signal noted at the T10-11 level on the STIR images. This lesion is also appreciated on the sagittal T2-weighted image but is not well seen on the axial T2-weighted images. No abnormal enhancement is seen in this region. This is not significantly changed compared to the comparison scan from 2009. 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 There is no significant spinal canal or neural  foraminal stenosis.     02/05/2018: Brain MRI with and w/o contrast, report (COMPARISON: MRI 06/13/2016): Multiple white matter lesions in a distribution compatible with multiple sclerosis. Several new lesions are identified since 06/13/2016. No abnormal enhancing lesions are identified.  02/05/2018: Cervical MRI with and w/o contrast, report (COMPARISON: Concurrent brain and lumbar spine MRIs, MRI total spine dated 06/11/2017): Stable thoracic cord lesion at T10-T11. No new lesions or abnormal enhancement in the cervical or thoracic spine. C4-C7 ACDF with unchanged mild canal narrowing and severe multilevel bilateral neural foraminal narrowing.  02/05/2018: Thoracic MRI with and w/o contrast, report (COMPARISON: Concurrent brain and lumbar spine MRIs, MRI total spine dated 06/11/2017: Stable thoracic cord lesion at T10-T11. No new lesions or abnormal enhancement in the cervical or thoracic spine. C4-C7 ACDF with unchanged mild canal narrowing and severe multilevel bilateral neural foraminal narrowing.    Lumbar puncture:  Per notes on 10/27/2009 by Dr. Fatima Sanger Plese had negative CSF studies.     Blood tests:  07/26/16 : serum creatinine: WNL.   02/25/14: Vit D, 1,25-Dihydroxy: 36 (ref. 18 - 78 pg/mL)  10/13/16: vitamin D 25OH: 83.8ng/mL (ref. 20.0 - 80.0)  Office Visit on 04/23/2018   Component Date Value Ref Range Status   ??? Sed Rate 04/23/2018 3  0 - 30 mm/h Final   ??? Sodium 04/23/2018 129* 135 - 145 mmol/L Final   ??? Potassium 04/23/2018 4.6  3.5 - 5.0 mmol/L Final   ??? Chloride 04/23/2018 93* 98 - 107 mmol/L Final   ??? CO2 04/23/2018 31.0* 22.0 - 30.0 mmol/L Final   ??? BUN 04/23/2018 17  7 - 21 mg/dL Final   ??? Creatinine 04/23/2018 0.46* 0.60 - 1.00 mg/dL Final   ??? BUN/Creatinine Ratio 04/23/2018 37   Final   ??? EGFR CKD-EPI Non-African American,* 04/23/2018 >90  >=60 mL/min/1.51m2 Final   ??? EGFR CKD-EPI African American, Fem* 04/23/2018 >90  >=60 mL/min/1.63m2 Final   ??? Glucose 04/23/2018 84  65 - 179 mg/dL Final   ??? Calcium 16/05/9603 8.6  8.5 - 10.2 mg/dL Final   ??? Albumin 54/04/8118 4.1  3.5 - 5.0 g/dL Final   ??? Total Protein 04/23/2018 6.5  6.5 - 8.3 g/dL Final   ??? Total Bilirubin 04/23/2018 0.2  0.0 - 1.2 mg/dL Final   ??? AST 14/78/2956 18  14 - 38 U/L Final   ??? ALT 04/23/2018 19  15 - 48 U/L Final   ??? Alkaline Phosphatase 04/23/2018 60  38 - 126 U/L Final   ??? Anion Gap 04/23/2018 5* 9 - 15 mmol/L Final   ??? CRP 04/23/2018 6.1  <10.0 mg/L Final   ??? Hepatitis C Ab 04/23/2018 Nonreactive  Nonreactive Final   ??? WBC 04/23/2018 3.8* 4.5 - 11.0 10*9/L Final   ??? RBC 04/23/2018 3.92* 4.00 - 5.20 10*12/L Final   ??? HGB 04/23/2018 12.5  12.0 - 16.0 g/dL Final   ??? HCT 21/30/8657 37.3  36.0 - 46.0 % Final   ??? MCV 04/23/2018 95.2  80.0 - 100.0 fL Final   ??? MCH 04/23/2018 32.0  26.0 - 34.0 pg Final   ??? MCHC 04/23/2018 33.6  31.0 - 37.0 g/dL Final   ??? RDW 84/69/6295 12.6  12.0 - 15.0 % Final   ??? MPV 04/23/2018 7.1  7.0 - 10.0 fL Final   ??? Platelet 04/23/2018 242  150 - 440 10*9/L Final   ??? Neutrophils % 04/23/2018 74.5  % Final   ??? Lymphocytes % 04/23/2018 12.3  %  Final   ??? Monocytes % 04/23/2018 8.1  % Final   ??? Eosinophils % 04/23/2018 2.8  % Final   ??? Basophils % 04/23/2018 0.3  % Final   ??? Absolute Neutrophils 04/23/2018 2.9  2.0 - 7.5 10*9/L Final   ??? Absolute Lymphocytes 04/23/2018 0.5* 1.5 - 5.0 10*9/L Final   ??? Absolute Monocytes 04/23/2018 0.3  0.2 - 0.8 10*9/L Final   ??? Absolute Eosinophils 04/23/2018 0.1  0.0 - 0.4 10*9/L Final   ??? Absolute Basophils 04/23/2018 0.0  0.0 - 0.1 10*9/L Final   ??? Large Unstained Cells 04/23/2018 2  0 - 4 % Final         Other:  11/21/2000: Terminal ileum, biopsy: No significant pathologic abnormality. Random colon, biopsy: Prominent melanosis coli.  .............................................................................................................................................        Past Medical History:  Past Medical History:   Diagnosis Date   ??? Diverticulitis large intestine 2000    with abscess and perforation with peritonitis   ??? Hypertension 2012   ??? 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 Osteoporosis      Allergies   Allergen Reactions   ??? Amantadine Nausea Only   ??? Codeine Itching     Current Outpatient Medications   Medication Sig Dispense Refill   ??? albuterol HFA 90 mcg/actuation inhaler Inhale 2 puffs Every four (4) hours. As needed for cough, shortness of breath or wheezing 1 each 0   ??? baclofen (LIORESAL) 20 MG tablet TAKE 1 TABLET THREE TIMES DAILY 270 tablet 1   ??? benzonatate (TESSALON) 200 MG capsule Take 1 capsule (200 mg total) by mouth Three (3) times a day as needed for cough. 30 capsule 0   ??? buPROPion (WELLBUTRIN SR) 150 MG 12 hr tablet Take 1 tablet (150 mg total) by mouth Two (2) times a day. 180 tablet 5   ??? carBAMazepine (CARBATROL) 200 MG 12 hr capsule Take 400 mg in the morning 200 mg at lunchtime and 400 mg in the evening. 150 capsule 2   ??? cholecalciferol, vitamin D3, 2,000 unit cap Take 2,000 Units by mouth daily.     ??? clobetasol (TEMOVATE) 0.05 % cream Apply 1 application topically Two (2) times a day.     ??? clonazePAM (KLONOPIN) 0.5 MG tablet Take 1 mg in the morning, 0.5 mg at lunchtime and 1 mg at bedtime. 150 tablet 2   ??? dimethyl fumarate 240 mg CpDR TAKE 1 CAPSULE BY MOUTH TWICE DAILY 60 capsule 5   ??? gabapentin (NEURONTIN) 300 MG capsule Take 1 capsule (300 mg total) by mouth Three (3) times a day. 270 capsule 1   ??? omeprazole (PRILOSEC) 40 MG capsule Take 1 capsule (40 mg total) by mouth daily. 90 capsule 0   ??? ondansetron (ZOFRAN-ODT) 4 MG disintegrating tablet Take 4 mg by mouth every eight (8) hours as needed for nausea.     ??? polyethylene glycol (GLYCOLAX) 17 gram/dose powder Take 17 g by mouth.     ??? Saccharomyces boulardii (FLORASTOR) 250 mg capsule Take 250 mg by mouth.     ??? ipratropium (ATROVENT HFA) 17 mcg/actuation inhaler Inhale 2 puffs every six (6) hours as needed for wheezing or shortness of breath. 12.9 g 1   ??? losartan (COZAAR) 25 MG tablet TAKE 1 TABLET BY MOUTH ONCE DAILY *STOP LISINOPRIL* 30 tablet 0     No current facility-administered medications for this visit.  Past Surgical History:   Procedure Laterality Date   ??? CATARACT EXTRACTION Bilateral 2016    [place]   ??? COLECTOMY  2001    Partial   ??? EYE SURGERY Left 2012    Laser sx due to detached retina    ??? HYSTERECTOMY     ??? PR ALLOGRAFT FOR SPINE SURGERY ONLY MORSELIZED Midline 03/04/2017    Procedure: Allograft For Spine Surgery Only; Morselized;  Surgeon: Timothy Lasso, MD;  Location: MAIN OR [facility];  Service: Ortho Spine   ??? PR ANTERIOR INSTRUMENTATION 4-7 VERTEBRAL SEGMENTS Midline 03/04/2017    Procedure: Anterior Instrumentation; 4 To 7 Vertebral Segments;  Surgeon: Timothy Lasso, MD;  Location: MAIN OR [facility];  Service: Ortho Spine   ??? PR ARTHRODESIS ANT INTERBODY INC DISCECTOMY, CERVICAL BELOW C2 Midline 03/04/2017    Procedure: Arthrodes, Ant Intrbdy, Incl Disc Spc Prep, Discect, Osteophyt/Decompress Spinl Crd &/Or Nrv Rt, Crv Blo C2;  Surgeon: Timothy Lasso, MD;  Location: MAIN OR [facility];  Service: Ortho Spine   ??? PR ARTHRODESIS ANT INTERBODY MIN DISCECTOMY, CERVICAL BELOW C2  03/04/2017    Procedure: Arthrodesis, Anterior Interbody Technique, Include Minimal Diskectomy To Prep Interspace; Cervical Below C2;  Surgeon: Timothy Lasso, MD;  Location: MAIN OR [facility];  Service: Ortho Spine   ??? PR ARTHRODESIS ANT INTERBODY MIN DISCECTOMY,EA ADDL  03/04/2017    Procedure: Arthrodesis, Anterior Interbody Technique, W/Minimal Diskectomy To Prep Interspace; Each Add`L Interspace;  Surgeon: Timothy Lasso, MD; Location: MAIN OR [facility];  Service: Ortho Spine   ??? PR AUTOGRAFT SPINE SURGERY LOCAL FROM SAME INCISION Midline 03/04/2017    Procedure: Autograft/Spine Surg Only (W/Harvest Graft); Local (Eg, Rib/Spinous Proc, Ples Specter) Obtain From Same Incis;  Surgeon: Timothy Lasso, MD;  Location: MAIN OR [facility];  Service: Ortho Spine   ??? PR INSJ BIOMCHN DEV INTERVERTEBRAL DSC SPC W/ARTHRD Midline 03/04/2017    Procedure: Insert Interbody Biomechanical Device(S) With Integral Anterior Instrument For Device Anchoring, When Performed, To Intervertebral Disc Space In Conjunction With Interbody Arthrodesis, Each Interspace;  Surgeon: Timothy Lasso, MD;  Location: MAIN OR [facility];  Service: Ortho Spine   ??? PR INSJ BIOMCHN DEV VRT CORPECTOMY DEFECT W/ARTHRD Midline 03/04/2017    Procedure: Insert Intervertebral Biomechanical Device(S) W Integral Anterior Instrument For Anchoring, When Performed, To Vert Corpectomy(Ies) Defect, In Conjunction W Interbody Arthrodesis, Each Contig Defect;  Surgeon: Timothy Lasso, MD;  Location: MAIN OR [facility];  Service: Ortho Spine   ??? PR REMV VERT BODY,CERV,ONE SGMT Midline 03/04/2017    Procedure: Vertebral Corpectomy-Ant W/Decomp; Cerv 1 Segmt;  Surgeon: Timothy Lasso, MD;  Location: MAIN OR [facility];  Service: Ortho Spine   ??? TONSILLECTOMY  1964   ??? TOTAL ABDOMINAL HYSTERECTOMY W/ BILATERAL SALPINGOOPHORECTOMY  2000           Social History:       Social History   ??? Marital status: separated     Spouse name: N/A   ??? Number of children: N/A   ??? Years of education: N/A     Social History Main Topics   ??? Smoking status: Never Smoker   ??? Smokeless tobacco: Never Used   ??? Alcohol use No   ??? Drug use: No   ??? Sexual activity: Not Asked     Other Topics Concern   ??? None     Social History Narrative    Lives with her son.       Family History:  Family History   Problem Relation Age of Onset   ??? Depression Mother    ??? Hypertension Mother    ??? Hyperlipidemia Mother    ??? Stroke Father    ??? Hyperlipidemia Father    ??? Hypertension Father    ??? Hypertension Sister    ??? No Known Problems Brother    ??? No Known Problems Maternal Aunt    ??? No Known Problems Maternal Uncle    ??? No Known Problems Paternal Aunt    ??? No Known Problems Paternal Uncle    ??? Stroke Maternal Grandmother    ??? Hyperlipidemia Maternal Grandmother    ??? Hypertension Maternal Grandmother    ??? Cancer Maternal Grandfather    ??? Hyperlipidemia Maternal Grandfather    ??? Hypertension Maternal Grandfather    ??? Hyperlipidemia Paternal Grandmother    ??? Hyperlipidemia Paternal Grandfather    ??? Amblyopia Neg Hx    ??? Blindness Neg Hx    ??? Cataracts Neg Hx    ??? Diabetes Neg Hx    ??? Glaucoma Neg Hx    ??? Macular degeneration Neg Hx    ??? Retinal detachment Neg Hx    ??? Strabismus Neg Hx    ??? Thyroid disease Neg Hx         Review of Systems:  A 10-systems review was performed and, unless otherwise noted, declared negative by patient.    Objective:     Physical Exam:  BP 131/60 (BP Site: L Arm, BP Position: Sitting, BP Cuff Size: Medium)  - Pulse 69  - Ht 167.9 cm (5' 6.1)  - Wt 61.8 kg (136 lb 3.2 oz)  - BMI 21.92 kg/m??     General Appearance: in no acute distress. Normal skin color, afebrile.  Eupneic, normal respiratory rate. Abdomen: Soft, non-tender. No peripheral  edema, peripheral pulses palpable.     NEUROLOGICAL EXAMINATION:     General:  Alert and oriented to person, place, time and situation.    Mild dysarthria, bradylalia, no aphasia. Naming/fluency/repetition intact.    Cranial Nerves:     II, III- Pupils are equal and a bit sluggishly reactive to light b/l. [name]  III, IV, VI- extra ocular movements are intact, No ptosis, denies diplopia on standard examination distance, no nystagmus.  V- normal facial sensation today  VII- WNL  VIII- Hearing grossly intact to conversation.   IX and X- symmetric palate contraction, mild dysarthria, bradylalia, reports mild dysphagia.  XI- Full shoulder shrug bilaterally; no wasting, normal tone and strength of sternocleidomastoid muscles bilaterally.  XII- No tongue atrophy, no tongue fasciculations; tongue protrudes midline, full range of movements of the tongue.    Neck flexion normal, reduced neck range of motion on  rotation b/l. Paraspinal cervical muscle spasm.     Motor Exam:     Mild distal hand interosseal muscle atrophy. Fasciculations not observed.     Muscle strength:    Muscles UEs  LEs    R L  R L   Deltoids 4+/5 4+/5 Hip flexors  4/5 4+/5   Biceps 5/5 5/5 Hip extensors 4/5 5/5   Triceps 4+/5 4+/5 Knee flexors 5-/5 5-/5   Hand grip 5/5 5/5 Knee extensors 5/5 5/5   Wrist flexors 5/5 5//5 Foot dorsal flexors 5/5 5/5   Wrist extensors 5/5 5-/5 Foot plantar flexors 5/5 5/5   Finger flexors 5-/5 5-/5      Finger extensors 5-/5 5-/5          Reflexes  R L   Biceps +2 +2   Brachioradialis  +2 +2   Triceps +2 +2   Patella +1 +1   Achilles +1 +1     Normal tone b/l. Negative Babinski sign bilaterally.    Sensory system:  ? Superficial light touch sensation: no facial sensory deficit on today's exam. No sensory level.   ? Vibration sense: lost on the right up to the ankle, where decreased, decreased in all other limbs  ? Position sense: WNL.   Pinprick test for pain sensation: WNL.  (WNL= within normal limits; UE= upper extremities; LE= lower extremities; R= right, L= left).    Cerebellar/Coordination:  No UE ataxia or intention tremor, No LE ataxia, Mild  gait ataxia. Romberg positive. Can not perform a tandem gait.    Gait: Paraparetic and ataxic, possible without assistance, needs unilateral assistance for longer distances.     EDSS score = 6.0  .........................................................................................................................................[name]    VISIT SUMMARY:  [name], a 67 y.o. Caucasian right handed female  presented for the follow-up of MS. [name] voiced a complete understanding of the diagnostic and treatment plan as detailed above.   Start of Visit Time: 08:05h  End of Visit Time: 09:04h  Total visit time = 59 minutes    Greater than 50% of the face to face time was spent in consultation on the  disease process, and treatment planning, medication, dosing and side effects.     Thank you for the opportunity to contribute to the care of [name].

## 2018-04-26 ENCOUNTER — Ambulatory Visit
Admit: 2018-04-26 | Discharge: 2018-04-26 | Disposition: A | Discharge status: Home / Self Care | Payer: MEDICARE | Attending: Emergency Medicine

## 2018-04-26 ENCOUNTER — Emergency Department
Admit: 2018-04-26 | Discharge: 2018-04-26 | Disposition: A | Discharge status: Home / Self Care | Payer: MEDICARE | Attending: Emergency Medicine

## 2018-04-26 DIAGNOSIS — R0602 Shortness of breath: Principal | ICD-10-CM

## 2018-04-26 LAB — CBC W/ AUTO DIFF
BASOPHILS ABSOLUTE COUNT: 0 10*9/L (ref 0.0–0.1)
BASOPHILS RELATIVE PERCENT: 0.4 %
EOSINOPHILS ABSOLUTE COUNT: 0.1 10*9/L (ref 0.0–0.4)
EOSINOPHILS RELATIVE PERCENT: 2.9 %
HEMATOCRIT: 39.3 % (ref 36.0–46.0)
HEMOGLOBIN: 13.1 g/dL (ref 12.0–16.0)
LARGE UNSTAINED CELLS: 2 % (ref 0–4)
LYMPHOCYTES ABSOLUTE COUNT: 0.5 10*9/L — ABNORMAL LOW (ref 1.5–5.0)
LYMPHOCYTES RELATIVE PERCENT: 14.1 %
MEAN CORPUSCULAR HEMOGLOBIN CONC: 33.4 g/dL (ref 31.0–37.0)
MEAN CORPUSCULAR HEMOGLOBIN: 32 pg (ref 26.0–34.0)
MEAN CORPUSCULAR VOLUME: 95.9 fL (ref 80.0–100.0)
MEAN PLATELET VOLUME: 7 fL (ref 7.0–10.0)
MONOCYTES ABSOLUTE COUNT: 0.3 10*9/L (ref 0.2–0.8)
MONOCYTES RELATIVE PERCENT: 9.4 %
NEUTROPHILS RELATIVE PERCENT: 70.7 %
PLATELET COUNT: 310 10*9/L (ref 150–440)
RED CELL DISTRIBUTION WIDTH: 12.7 % (ref 12.0–15.0)
WBC ADJUSTED: 3.6 10*9/L — ABNORMAL LOW (ref 4.5–11.0)

## 2018-04-26 LAB — COMPREHENSIVE METABOLIC PANEL
ALBUMIN: 4.4 g/dL (ref 3.5–5.0)
ALKALINE PHOSPHATASE: 64 U/L (ref 38–126)
ALT (SGPT): 13 U/L — ABNORMAL LOW (ref 15–48)
ANION GAP: 7 mmol/L — ABNORMAL LOW (ref 9–15)
AST (SGOT): 19 U/L (ref 14–38)
BILIRUBIN TOTAL: 0.3 mg/dL (ref 0.0–1.2)
BLOOD UREA NITROGEN: 22 mg/dL — ABNORMAL HIGH (ref 7–21)
BUN / CREAT RATIO: 43
CALCIUM: 9.4 mg/dL (ref 8.5–10.2)
CHLORIDE: 92 mmol/L — ABNORMAL LOW (ref 98–107)
CO2: 29 mmol/L (ref 22.0–30.0)
CREATININE: 0.51 mg/dL — ABNORMAL LOW (ref 0.60–1.00)
EGFR CKD-EPI AA FEMALE: >90 mL/min/{1.73_m2} (ref >=60)
EGFR CKD-EPI NON-AA FEMALE: >90 mL/min/{1.73_m2} (ref >=60)
GLUCOSE RANDOM: 86 mg/dL (ref 65–179)
POTASSIUM: 5.1 mmol/L — ABNORMAL HIGH (ref 3.5–5.0)
PROTEIN TOTAL: 6.7 g/dL (ref 6.5–8.3)

## 2018-04-26 LAB — BLOOD GAS, VENOUS
BASE EXCESS VENOUS: 4.4 — ABNORMAL HIGH (ref -2.0–2.0)
O2 SATURATION VENOUS: 30.3 % — ABNORMAL LOW (ref 40.0–85.0)
PCO2 VENOUS: 54 mmHg (ref 40–60)
PH VENOUS: 7.36 (ref 7.32–7.43)

## 2018-04-26 LAB — CARBAMAZEPINE LEVEL: Carbamazepine:MCnc:Pt:Ser/Plas:Qn:: 8.5

## 2018-04-26 LAB — CHLORIDE: Chloride:SCnc:Pt:Ser/Plas:Qn:: 92 — ABNORMAL LOW

## 2018-04-26 LAB — EOSINOPHILS RELATIVE PERCENT: Lab: 2.9

## 2018-04-26 LAB — TROPONIN I: Troponin I.cardiac:MCnc:Pt:Ser/Plas:Qn:: <0.034

## 2018-04-26 LAB — MAGNESIUM: Magnesium:MCnc:Pt:Ser/Plas:Qn:: 2.1

## 2018-04-26 LAB — HCO3 VENOUS: Bicarbonate:SCnc:Pt:BldA:Qn:: 30 — ABNORMAL HIGH

## 2018-04-26 MED ORDER — IPRATROPIUM BROMIDE 17 MCG/ACTUATION HFA AEROSOL INHALER
Freq: Four times a day (QID) | RESPIRATORY_TRACT | 1 refills | 0.00000 days | Status: CP | PRN
Start: 2018-04-26 — End: 2018-09-30

## 2018-04-26 NOTE — Unmapped (Signed)
Pt arrived to treatment area, reports her inhaler that she used at 0700 today is wearing off and states she is feeling more SOB. Ambulated with steady gait. Speaking in shortened sentences d/t SOB. LSC throughout. +non-productive cough.

## 2018-04-26 NOTE — Unmapped (Signed)
Patient rounds are completed. Patient doesn't have any immediate needs at the moment. Patient has call bell within reach, personal belongings within reach, bed locked and in lowest position.

## 2018-04-26 NOTE — Unmapped (Signed)
[facility]  Emergency Department Provider Note    ED Clinical Impression     Final diagnoses:   Shortness of breath (Primary)        Initial Impression, ED Course, Assessment and Plan     Impression:   - Shortness of breath  Presents with 2 years of shortness of breath which has gotten worse over the past several weeks. O2 sats 99% on room air and appears comfortable. Labs notable for chronic CO2 retention w/ VBG showing pCO2 54 and pH 7.36. CXR hyperinflated with b/l apical scarring. Peak flow is low at 240 with no significant change (230) after ipratropium and albuterol nebs.     Differential diagnosis includes undiagnosed COPD, anxiety, diaphragm weakness in setting of MS. Less likely PE given lack of risk factors and stable vitals.    Patient is overall stable and feels slightly better after the duoneb.  Will also give 2 mg IV mag. She is not scheduled to see pulmonology until 10/23 for further evaluation.  We called the clinic today and are able to get her PFTs this afternoon.  From there she will be on the waiting list for pulmonology and possibly see them earlier if there is a cancellation on their schedule.  Pulmonary scheduler is aware of the issue and will contact her with updates. She will follow up with PCP and psychiatry regarding anxiety management.    - Hyponatremia  Chronic issue on carbamazepime. Level is 128 from 129/130 previously. Carbamazepine level wnl, will give 1L NS.    Patient is comfortable with discharge this afternoon after completion of IV magnesium and fluids.    Additional Medical Decision Making     I have reviewed the vital signs and the nursing notes. Labs and radiology results that were available during my care of the patient were independently reviewed by me and considered in my medical decision making.     I staffed the case with the ED attending, Dr. Myrtis Ser.    I independently visualized the EKG tracing.   I independently visualized the radiology images.   I reviewed the patient's prior medical records      Portions of this record have been created using Dragon dictation software. Dictation errors have been sought, but may not have been identified and corrected.  ____________________________________________       History     Chief Complaint  Shortness of Breath      HPI   Ashlee Christian is a 67 y.o. female with MS and anxiety who presents to the ED for evaluation of shortness of breath.  States that she is had the symptoms for approximately 2 years, worse over the past several weeks. Her shortness of breath occurs more often at rest at various times of the day.  She says that she cannot get a full breath in before she starts coughing.  Cough is nonproductive. Does not have any dyspnea on exertion.  No fevers or chills, no runny nose or sore throat, no recent sick contacts, no travel. No exposure to pets or mold in the home.  No recent immobilization or lower extremity swelling.  No significant smoking history (1 cigarette/day for several years). Her symptoms get slightly better with her Klonopin which she takes for anxiety, as well as albuterol.  She was started on the albuterol by an urgent care physician 2 weeks ago along with 5 days of prednisone, but the prednisone did not lead to any significant benefit.    She  also has been having worsening MS hugs recently, which she describes as a tightening around her ribs which occurs more frequently in the afternoons and.  Her breathing does get slightly worse during this time.  No recent neurologic deficits.    She came to the ED today because her pulmonology appointment is not until 10/23 and she wanted to see if the ED providers could provide her with any other recommendations.    Past Medical History:   Diagnosis Date   ??? Diverticulitis large intestine 2000    with abscess and perforation with peritonitis   ??? Hypertension 2012   ??? 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 Osteoporosis        Patient Active Problem List   Diagnosis   ??? Multiple sclerosis (CMS-HCC)   ??? Binocular vision disorder with diplopia   ??? Spinal stenosis of cervical region   ??? Hypertension   ??? Cognitive complaints   ??? Neuropathic pain   ??? Bilateral occipital neuralgia   ??? Depression   ??? Hx of fusion of cervical spine   ??? Hesitancy of micturition   ??? Anxiety and depression       Past Surgical History:   Procedure Laterality Date   ??? CAT[place]ACT EXTRACTION Bilateral 2016    [place]   ??? COLECTOMY  2001    Partial   ??? EYE SURGERY Left 2012    Laser sx due to detached retina    ??? HYSTERECTOMY     ??? PR ALLOGRAFT FOR SPINE SURGERY ONLY MORSELIZED Midline 03/04/2017    Procedure: Allograft For Spine Surgery Only; Morselized;  Surgeon: Timothy Lasso, MD;  Location: MAIN OR [facility];  Service: Ortho Spine   ??? PR ANTERIOR INSTRUMENTATION 4-7 VERTEBRAL SEGMENTS Midline 03/04/2017    Procedure: Anterior Instrumentation; 4 To 7 Vertebral Segments;  Surgeon: Timothy Lasso, MD;  Location: MAIN OR [facility];  Service: Ortho Spine   ??? PR ARTHRODESIS ANT INTERBODY INC DISCECTOMY, CERVICAL BELOW C2 Midline 03/04/2017    Procedure: Arthrodes, Ant Intrbdy, Incl Disc Spc Prep, Discect, Osteophyt/Decompress Spinl Crd &/Or Nrv Rt, Crv Blo C2;  Surgeon: Timothy Lasso, MD;  Location: MAIN OR [facility];  Service: Ortho Spine   ??? PR ARTHRODESIS ANT INTERBODY MIN DISCECTOMY, CERVICAL BELOW C2  03/04/2017    Procedure: Arthrodesis, Anterior Interbody Technique, Include Minimal Diskectomy To Prep Interspace; Cervical Below C2;  Surgeon: Timothy Lasso, MD;  Location: MAIN OR [facility];  Service: Ortho Spine   ??? PR ARTHRODESIS ANT INTERBODY MIN DISCECTOMY,EA ADDL  03/04/2017    Procedure: Arthrodesis, Anterior Interbody Technique, W/Minimal Diskectomy To Prep Interspace; Each Add`L Interspace;  Surgeon: Timothy Lasso, MD;  Location: MAIN OR [facility];  Service: Ortho Spine   ??? PR AUTOGRAFT SPINE SURGERY LOCAL FROM SAME INCISION Midline 03/04/2017    Procedure: Autograft/Spine Surg Only (W/Harvest Graft); Local (Eg, Rib/Spinous Proc, Ples Specter) Obtain From Same Incis;  Surgeon: Timothy Lasso, MD;  Location: MAIN OR [facility];  Service: Ortho Spine   ??? PR INSJ BIOMCHN DEV INTERVERTEBRAL DSC SPC W/ARTHRD Midline 03/04/2017    Procedure: Insert Interbody Biomechanical Device(S) With Integral Anterior Instrument For Device Anchoring, When Performed, To Intervertebral Disc Space In Conjunction With Interbody Arthrodesis, Each Interspace;  Surgeon: Timothy Lasso, MD;  Location: MAIN OR [facility];  Service: Ortho Spine   ??? PR INSJ BIOMCHN DEV VRT CORPECTOMY DEFECT W/ARTHRD Midline 03/04/2017    Procedure: Insert Intervertebral Biomechanical Device(S) W Integral  Anterior Instrument For Anchoring, When Performed, To Vert Corpectomy(Ies) Defect, In Conjunction W Interbody Arthrodesis, Each Contig Defect;  Surgeon: Timothy Lasso, MD;  Location: MAIN OR [facility];  Service: Ortho Spine   ??? PR REMV VERT BODY,CERV,ONE SGMT Midline 03/04/2017    Procedure: Vertebral Corpectomy-Ant W/Decomp; Cerv 1 Segmt;  Surgeon: Timothy Lasso, MD;  Location: MAIN OR [facility];  Service: Ortho Spine   ??? TONSILLECTOMY  1964   ??? TOTAL ABDOMINAL HYSTERECTOMY W/ BILATERAL SALPINGOOPHORECTOMY  2000         Current Facility-Administered Medications:   ???  ipratropium (ATROVENT) 0.02 % nebulizer solution, , , ,   ???  magnesium sulfate 2gm/77mL IVPB, 2 g, Intravenous, Once, Lynnell Jude, MD  ???  methylPREDNISolone sodium succinate (PF) (Solu-MEDROL) injection 125 mg, 125 mg, Intravenous, Once, Lynnell Jude, MD  ???  sodium chloride 0.9% (NS BOLUS) bolus 1,000 mL, 1,000 mL, Intravenous, Once, Lynnell Jude, MD    Current Outpatient Medications:   ???  albuterol HFA 90 mcg/actuation inhaler, Inhale 2 puffs Every four (4) hours. As needed for cough, shortness of breath or wheezing, Disp: 1 each, Rfl: 0  ???  baclofen (LIORESAL) 20 MG tablet, TAKE 1 TABLET THREE TIMES DAILY, Disp: 270 tablet, Rfl: 1  ??? benzonatate (TESSALON) 200 MG capsule, Take 1 capsule (200 mg total) by mouth Three (3) times a day as needed for cough., Disp: 30 capsule, Rfl: 0  ???  buPROPion (WELLBUTRIN SR) 150 MG 12 hr tablet, Take 1 tablet (150 mg total) by mouth Two (2) times a day., Disp: 180 tablet, Rfl: 5  ???  carBAMazepine (CARBATROL) 200 MG 12 hr capsule, Take 400 mg in the morning 200 mg at lunchtime and 400 mg in the evening., Disp: 150 capsule, Rfl: 2  ???  cholecalciferol, vitamin D3, 2,000 unit cap, Take 2,000 Units by mouth daily., Disp: , Rfl:   ???  clobetasol (TEMOVATE) 0.05 % cream, Apply 1 application topically Two (2) times a day., Disp: , Rfl:   ???  clonazePAM (KLONOPIN) 0.5 MG tablet, Take 1 mg in the morning, 0.5 mg at lunchtime and 1 mg at bedtime., Disp: 150 tablet, Rfl: 2  ???  dimethyl fumarate 240 mg CpDR, TAKE 1 CAPSULE BY MOUTH TWICE DAILY, Disp: 60 capsule, Rfl: 5  ???  gabapentin (NEURONTIN) 300 MG capsule, TAKE 1 CAPSULE THREE TIMES DAILY, Disp: 270 capsule, Rfl: 0  ???  losartan (COZAAR) 25 MG tablet, Take 1 tablet (25 mg total) by mouth daily., Disp: 30 tablet, Rfl: 1  ???  omeprazole (PRILOSEC) 40 MG capsule, Take 1 capsule (40 mg total) by mouth daily., Disp: 90 capsule, Rfl: 0  ???  ondansetron (ZOFRAN-ODT) 4 MG disintegrating tablet, Take 4 mg by mouth every eight (8) hours as needed for nausea., Disp: , Rfl:   ???  polyethylene glycol (GLYCOLAX) 17 gram/dose powder, Take 17 g by mouth., Disp: , Rfl:   ???  Saccharomyces boulardii (FLORASTOR) 250 mg capsule, Take 250 mg by mouth., Disp: , Rfl:     Allergies  Amantadine and Codeine    Family History   Problem Relation Age of Onset   ??? Depression Mother    ??? Hypertension Mother    ??? Hyperlipidemia Mother    ??? Stroke Father    ??? Hyperlipidemia Father    ??? Hypertension Father    ??? Hypertension Sister    ??? No Known Problems Brother    ??? No Known Problems Maternal Aunt    ???  No Known Problems Maternal Uncle    ??? No Known Problems Paternal Aunt    ??? No Known Problems Paternal Uncle ??? Stroke Maternal Grandmother    ??? Hyperlipidemia Maternal Grandmother    ??? Hypertension Maternal Grandmother    ??? Cancer Maternal Grandfather    ??? Hyperlipidemia Maternal Grandfather    ??? Hypertension Maternal Grandfather    ??? Hyperlipidemia Paternal Grandmother    ??? Hyperlipidemia Paternal Grandfather    ??? Amblyopia Neg Hx    ??? Blindness Neg Hx    ??? Cataracts Neg Hx    ??? Diabetes Neg Hx    ??? Glaucoma Neg Hx    ??? Macular degeneration Neg Hx    ??? Retinal detachment Neg Hx    ??? Strabismus Neg Hx    ??? Thyroid disease Neg Hx        Social History  Social History     Tobacco Use   ??? Smoking status: Former Smoker     Types: Cigarettes     Last attempt to quit: 01/19/2016     Years since quitting: 2.2   ??? Smokeless tobacco: Never Used   Substance Use Topics   ??? Alcohol use: No   ??? Drug use: No       Review of Systems  Constitutional: Negative for fever.  Eyes: Negative for visual changes.  ENT: Negative for sore throat.  Cardiovascular: Negative for chest pain.  Respiratory: Positive for shortness of breath and nonproductive cough  Gastrointestinal: Negative for abdominal pain, vomiting or diarrhea.  Genitourinary: Negative for dysuria.  Musculoskeletal: Negative for back pain.  Skin: Negative for rash.  Neurological: Negative for headaches, focal weakness or numbness.    Physical Exam     ED Triage Vitals   Enc Vitals Group      BP 04/26/18 0947 115/72      Heart Rate 04/26/18 0947 73      SpO2 Pulse 04/26/18 1214 100      Resp 04/26/18 0947 18      Temp 04/26/18 0947 36.7 ??C (98 ??F)      Temp Source 04/26/18 0947 Oral      SpO2 04/26/18 0947 98 %      Weight --       Height --       Head Circumference --       Peak Flow --       Pain Score --       Pain Loc --       Pain Edu? --       Excl. in GC? --        Constitutional: Alert and oriented. Well appearing and in no distress.  Eyes: Conjunctivae are normal.  ENT       Head: Normocephalic and atraumatic.       Nose: No congestion.       Mouth/Throat: Mucous membranes are moist.       Neck: No stridor.  Hematological/Lymphatic/Immunilogical: No cervical lymphadenopathy.  Cardiovascular: Normal rate, regular rhythm. Normal and symmetric distal pulses are present in all extremities.  Respiratory: Coughing after deep inspiration. When not coughing she has a prolonged expiratory phase.  No wheezing or crackles.  Gastrointestinal: Soft and nontender. There is no CVA tenderness.  Musculoskeletal: Normal range of motion in all extremities.       Right lower leg: No tenderness or edema.       Left lower leg: No tenderness or edema.  Neurologic: Normal speech  and language. CNs 2-12 intact and symmetric. 5/5 strength in all extremities, sensation intact and symmetric throughout. Cerebellar function normal b/l with finger-to-nose testing.  Skin: Skin is warm, dry and intact. No rash noted.  Psychiatric: Mood and affect are normal. Speech and behavior are normal.      EKG     NSR. No q waves, TWIs, ST changes    Radiology     Biapical pleural-parenchymal scarring. Otherwise clear lungs.    No pleural effusion or pneumothorax.    Unremarkable cardiomediastinal silhouette.     Impression:   No acute airspace disease      Procedures     None           Lynnell Jude, MD  Resident  04/26/18 [phone]

## 2018-04-26 NOTE — Unmapped (Signed)
Ambulating to bathroom with steady gait. Denies any pain at this time. Reports feeling better after treatment. Plan for discharge.

## 2018-04-26 NOTE — Unmapped (Addendum)
PT endorses worsening SOB x2 years, was told it was worsening anxiety from her divorce. PT was put on clonipin to help with SOB but reports her neurologist helping her taper off medication b/c she didn't want to be on it. Pt has hx of MS and has MS hug and reports sx worse over the last two weeks. Pt dx last week with hyperinflated lungs and referred to pulmonology. Pt reports worsening SOB overnight with minimal relief from inhaler. Pt speaking in full sentences in triage. Last breathing tx (ventolin) this AM at 0630.

## 2018-04-26 NOTE — Unmapped (Signed)
RT at bedside to perform peak flow and give nebulizer treatment.

## 2018-04-26 NOTE — Unmapped (Signed)
Patient rounds completed. Patient doesn't have any immediate needs or concerns at the moment. Patient call bell within reach, bed locked and in lowest position. Patient personal belongings within reach of patient.

## 2018-04-27 MED ORDER — LOSARTAN 25 MG TABLET
ORAL_TABLET | 0 refills | 0 days | Status: CP
Start: 2018-04-27 — End: 2018-04-29

## 2018-04-28 MED ORDER — GABAPENTIN 300 MG CAPSULE
ORAL_CAPSULE | Freq: Three times a day (TID) | ORAL | 1 refills | 0 days | Status: CP
Start: 2018-04-28 — End: 2018-09-20

## 2018-04-29 ENCOUNTER — Ambulatory Visit: Admit: 2018-04-29 | Discharge: 2018-04-30 | Payer: MEDICARE | Attending: Family Medicine | Primary: Family Medicine

## 2018-04-29 DIAGNOSIS — R0602 Shortness of breath: Principal | ICD-10-CM

## 2018-04-29 DIAGNOSIS — K219 Gastro-esophageal reflux disease without esophagitis: Secondary | ICD-10-CM

## 2018-04-29 DIAGNOSIS — F329 Major depressive disorder, single episode, unspecified: Secondary | ICD-10-CM

## 2018-04-29 DIAGNOSIS — F419 Anxiety disorder, unspecified: Secondary | ICD-10-CM

## 2018-04-29 DIAGNOSIS — I10 Essential (primary) hypertension: Secondary | ICD-10-CM

## 2018-04-29 DIAGNOSIS — G35 Multiple sclerosis: Secondary | ICD-10-CM

## 2018-04-29 MED ORDER — LOSARTAN 25 MG TABLET
ORAL_TABLET | Freq: Every day | ORAL | 3 refills | 0 days | Status: CP
Start: 2018-04-29 — End: 2019-02-28

## 2018-04-29 MED ORDER — OMEPRAZOLE 20 MG CAPSULE,DELAYED RELEASE
ORAL_CAPSULE | Freq: Every day | ORAL | 3 refills | 0.00000 days | Status: CP
Start: 2018-04-29 — End: 2019-04-01

## 2018-04-29 MED ORDER — BUPROPION HCL SR 150 MG TABLET,12 HR SUSTAINED-RELEASE
ORAL_TABLET | Freq: Two times a day (BID) | ORAL | 1 refills | 0 days | Status: CP
Start: 2018-04-29 — End: 2018-06-21

## 2018-04-29 NOTE — Unmapped (Addendum)
Decrease Prilosec to 20mg  instead of 40mg  for long term safety issues.     Get influenza vaccines at least 2 weeks after your last steroid dosage.     Get tetanus and high dose flu vaccines in about 2-3 weeks.     Follow up in 6 months.

## 2018-04-29 NOTE — Unmapped (Signed)
Assessment:        1. Shortness of breath    2. Multiple sclerosis (CMS-HCC)    3. Anxiety and depression    4. Essential hypertension    5. Gastroesophageal reflux disease, esophagitis presence not specified            Plan:        Patient instructions:  Decrease Prilosec to 20mg  instead of 40mg  for long term safety issues.     Get influenza vaccines at least 2 weeks after your last steroid dosage.     Get tetanus and high dose flu vaccines in about 2-3 weeks.     Follow up in 6 months.    Barriers to goals identified and addressed. Pertinent handouts were given today and reviewed with the patient as indicated.  The Care Plan and Self-Management goals have been included on the AVS and the AVS has been printed. Any outside resources or referrals needed at this time are noted above.  Any new medications prescribed have been discussed, and side effects have been addressed. Have assessed the patient's understanding, response, and barriers to adherence to medications. Patient voiced understanding and all questions have been answered to satisfaction.        Subjective:     Ashlee Christian is a 67 y.o. female.    HPI:  This is a follow-up visit from [facility] emergency department visit for shortness of breath.  While at the ED she had a negative work-up including normal O2 sats a low peak flow of 240 without improvement with either ipratropium or albuterol.  She has both anxiety and MS which may be contributing to her shortness of breath.  She has a follow-up with her neurologist in the next week or 2.  She was found to be hyponatremic likely due to her carbamazepine.  She denies mental sluggishness or confusion.    Regarding her GERD it has been under good control and she is noted to be taking 40 mg of Prilosec daily.    She has a history of hypertension however her blood pressures have been normal.  She is not on blood pressure medication at this time.    The following portions of the patient's history were reviewed and updated as appropriate: allergies, current medications, past family history, past medical history, past social history, past surgical history and problem list.    Review of Systems  Pertinent items are noted in HPI.          Objective:      Physical Exam:  VITAL SIGNS: BP 100/72  - Pulse 80  - Temp 36.7 ??C (98 ??F) (Oral)  - Resp 20  - Ht 167.9 cm (5' 6.1)  - Wt 61.4 kg (135 lb 6.4 oz)  - SpO2 96% Comment: on room air - BMI 21.79 kg/m??   Wt Readings from Last 6 Encounters:   04/29/18 61.4 kg (135 lb 6.4 oz)   04/23/18 61.8 kg (136 lb 3.2 oz)   03/20/18 61.8 kg (136 lb 3.2 oz)   03/11/18 61.1 kg (134 lb 12.8 oz)   01/18/18 61 kg (134 lb 6.4 oz)   12/24/17 61.2 kg (135 lb)     GENERAL: Well appearing, alert, in NAD  HEAD: Normocephalic and atraumatic  CHEST: Clear to auscultation bilaterally.  Respirations are unlabored.  No chest wall deformity or tenderness.  HEART: RRR w/o murmurs  VASCULAR:  No edema  PSYCHOLOGIC: No evidence of anxiety or depression.  Normal mood.  Appropriate affect    Laboratory Values:  Results for orders placed or performed during the hospital encounter of 04/26/18   Comprehensive metabolic panel   Result Value Ref Range    Sodium 128 (L) 135 - 145 mmol/L    Potassium 5.1 (H) 3.5 - 5.0 mmol/L    Chloride 92 (L) 98 - 107 mmol/L    CO2 29.0 22.0 - 30.0 mmol/L    BUN 22 (H) 7 - 21 mg/dL    Creatinine 1.61 (L) 0.60 - 1.00 mg/dL    BUN/Creatinine Ratio 43     EGFR CKD-EPI Non-African American, Female >90 >=60 mL/min/1.65m2    EGFR CKD-EPI African American, Female >90 >=60 mL/min/1.73m2    Glucose 86 65 - 179 mg/dL    Calcium 9.4 8.5 - 09.6 mg/dL    Albumin 4.4 3.5 - 5.0 g/dL    Total Protein 6.7 6.5 - 8.3 g/dL    Total Bilirubin 0.3 0.0 - 1.2 mg/dL    AST 19 14 - 38 U/L    ALT 13 (L) 15 - 48 U/L    Alkaline Phosphatase 64 38 - 126 U/L    Anion Gap 7 (L) 9 - 15 mmol/L   Troponin I   Result Value Ref Range    Troponin I <0.034 <0.034 ng/mL   Magnesium Level   Result Value Ref Range    Magnesium 2.1 1.6 - 2.2 mg/dL   Blood Gas, Venous   Result Value Ref Range    Specimen Source Venous     FIO2 Venous Room Air     pH, Venous 7.36 7.32 - 7.43    pCO2, Ven 54 40 - 60 mm Hg    pO2, Ven 23 (L) 30 - 55 mm Hg    HCO3, Ven 30 (H) 22 - 27 mmol/L    Base Excess, Ven 4.4 (H) -2.0 - 2.0    O2 Saturation, Venous 30.3 (L) 40.0 - 85.0 %   Carbamazepine level, total   Result Value Ref Range    Carbamazepine Level 8.5 4.0 - 12.0 ug/mL   ECG 12 lead (Adult)   Result Value Ref Range    EKG Systolic BP  mmHg    EKG Diastolic BP  mmHg    EKG Ventricular Rate 71 BPM    EKG Atrial Rate 71 BPM    EKG P-R Interval 178 ms    EKG QRS Duration 94 ms    EKG Q-T Interval 372 ms    EKG QTC Calculation 404 ms    EKG Calculated P Axis 60 degrees    EKG Calculated R Axis 54 degrees    EKG Calculated T Axis 62 degrees    QTC Fredericia 393 ms   CBC w/ Differential   Result Value Ref Range    WBC 3.6 (L) 4.5 - 11.0 10*9/L    RBC 4.09 4.00 - 5.20 10*12/L    HGB 13.1 12.0 - 16.0 g/dL    HCT 04.5 40.9 - 81.1 %    MCV 95.9 80.0 - 100.0 fL    MCH 32.0 26.0 - 34.0 pg    MCHC 33.4 31.0 - 37.0 g/dL    RDW 91.4 78.2 - 95.6 %    MPV 7.0 7.0 - 10.0 fL    Platelet 310 150 - 440 10*9/L    Neutrophils % 70.7 %    Lymphocytes % 14.1 %    Monocytes % 9.4 %    Eosinophils % 2.9 %    Basophils %  0.4 %    Absolute Neutrophils 2.6 2.0 - 7.5 10*9/L    Absolute Lymphocytes 0.5 (L) 1.5 - 5.0 10*9/L    Absolute Monocytes 0.3 0.2 - 0.8 10*9/L    Absolute Eosinophils 0.1 0.0 - 0.4 10*9/L    Absolute Basophils 0.0 0.0 - 0.1 10*9/L    Large Unstained Cells 2 0 - 4 %

## 2018-04-29 NOTE — Unmapped (Signed)
9.9  Faxed the CBC/diff and CMP external orders to the PCP. Called patient to let her know they were faxed and need to be drawn in December.     Lowella Bandy

## 2018-04-30 LAB — NMO AQP4 IGG, SERUM: Aquaporin 4 receptor Ab.IgG:PrThr:Pt:Ser/Plas:Ord:: NEGATIVE

## 2018-04-30 LAB — NMO AQP4 IGG: NMO AQP4 IGG, SERUM: NEGATIVE

## 2018-05-03 ENCOUNTER — Ambulatory Visit: Admit: 2018-05-03 | Discharge: 2018-05-04 | Payer: MEDICARE

## 2018-05-03 DIAGNOSIS — R0602 Shortness of breath: Principal | ICD-10-CM

## 2018-05-08 NOTE — Unmapped (Signed)
Transitions Child psychotherapist at the [facility]   (outpatient):     SW received in basket message from Carney Corners, RN with the [facility] in reference to patient not being able to afford certain medications, Benzonnatate and Atrovent HFA.     SW called patient @ [phone] (home)  and explained reason for call. SWer inquired about patient's income for extra help programs to reduce cost of her medications. Patient states even if the cost was reduced in half, she would not be able to afford it. Patient states that Atrovent HFA is not covered by her insurance and would like the doctor to call her insurance company and tell them it is necessary to have these medications and insurance needs to cover them.    SW provided patient with the information for extra help program through Medicare via e-mail.     SW sent an in-basket back to Carney Corners, RN and Henrene Dodge, MD  to inform of the above.

## 2018-05-17 NOTE — Unmapped (Signed)
Patient stated she had been to the ED about two weeks ago and was put on two different inhalers. She said that xray showed scaring in her lungs She stated she has been having this problems for the last two years. The patient stated that the Clonazepam also helps to calm her together with her inhalers. She saw her PCP  On the 9th of September, after her ED visit on 04/26/2018. She had a breathing test done on 05/03/2018. She has an appointment with Pulmonary on 06/05/2018. She was able to articulate her words appropriately and di not sound winded. She stated she would go to the ED if she felt it was necessary.

## 2018-05-17 NOTE — Unmapped (Signed)
[facility] Refill Coordination Note  Specialty Medication(s): TECIFDERA [address]      Ashlee Christian, DOB: Jun 23, 1951  Phone: [phone] (home) , Alternate phone contact: N/A  Phone or address changes today?: No  All above HIPAA information was verified with patient.  Shipping Address: [address]  [place] [address]   Insurance changes? No    Completed refill call assessment today to schedule patient's medication shipment from the [facility] [phone]).      Confirmed the medication and dosage are correct and have not changed: Yes, regimen is correct and unchanged.    Confirmed patient started or stopped the following medications in the past month:  No, there are no changes reported at this time.    Are you tolerating your medication?:  Ashlee Christian reports tolerating the medication.     PATIENT REPORTED DIFFICULTY BREATHING, HOWEVER FROM MY END, SHE CONVERSED WITH NO PROBLEMS- NO COUGHING, NO DELAY, NO WHEEZING SOUND. SHE STATED SHE PAID $400 OUT OF POCKET FOR ATROVENT, AND SHE ALSO USES AN ALBUTEROL INHALER. SHE WILL HAVE HER PULMONOLOGIST SEND SCRIPTS TO [facility] FOR INHALERS FROM NOW ON, SO WE CAN GET HER SOMETHING THAT IS LOWER IN PRICE, OR SHE CAN MAKE PAYMENTS.-- PT REFUSED TO BE TRANSFERRED TO A PHARMACIST, AND REFUSED TO GO TO THE EMERGENCY ROOM, AS SHE STATED SHE DIDN'T NEED TO, BUT SHE WILL IF SHE GETS TO 'THAT POINT'. PATIENT ALSO DECLINED TO SPEAK TO PHARMACIST TO KNOW WHAT 'THAT POINT' WOULD BE.    ADHERENCE    (Below is required for Medicare Part B or Transplant patients only - per drug):   How many tablets were dispensed last month: 60  Patient currently has UNKNOWN, PATIENT IS CURRENTLY AT [facility] remaining.    Did you miss any doses in the past 4 weeks? No missed doses reported.    FINANCIAL/SHIPPING    Delivery Scheduled: Yes, Expected medication delivery date: 10/1     The patient will receive a drug information handout for each medication shipped and additional FDA Medication Guides as required.      Ashlee Christian did not have any additional questions at this time.    Delivery address validated in Epic.    We will follow up with patient monthly for standard refill processing and delivery.      Thank you,  Westley Gambles   [facility] Shared [facility] Specialty Technician

## 2018-05-20 MED FILL — TECFIDERA 240 MG CAPSULE,DELAYED RELEASE: ORAL | 30 days supply | Qty: 60 | Fill #1

## 2018-05-20 MED FILL — TECFIDERA 240 MG CAPSULE,DELAYED RELEASE: 30 days supply | Qty: 60 | Fill #1 | Status: AC

## 2018-06-05 ENCOUNTER — Ambulatory Visit: Admit: 2018-06-05 | Discharge: 2018-06-06 | Payer: MEDICARE | Attending: Internal Medicine | Primary: Internal Medicine

## 2018-06-05 DIAGNOSIS — G35 Multiple sclerosis: Principal | ICD-10-CM

## 2018-06-05 DIAGNOSIS — J988 Other specified respiratory disorders: Secondary | ICD-10-CM

## 2018-06-05 DIAGNOSIS — R942 Abnormal results of pulmonary function studies: Secondary | ICD-10-CM

## 2018-06-05 DIAGNOSIS — R0989 Other specified symptoms and signs involving the circulatory and respiratory systems: Secondary | ICD-10-CM

## 2018-06-05 DIAGNOSIS — R29898 Other symptoms and signs involving the musculoskeletal system: Secondary | ICD-10-CM

## 2018-06-05 NOTE — Unmapped (Signed)
Pulmonary Clinic - Initial Visit    Referring Physician :  Felicity Coyer  PCP:     Garnette Czech, MD  Reason for Consult:   Chest tightness         HISTORY:     History of Present Illness:  Ashlee Christian is a 67 y.o. female with a history of MS who presents to clinic with chest tightness.     Patient wishes to record this visit.     Two and a half years ago, January 2017, shortness of breath, especially when she woke up int he morning. Thought it was related to anxiety at the time due to problems with her marriage. As she got up and got around it would get better. PCP prescribed clonazepam, which worked well for her.    In September 2017 saw her Neurologist, complaining of tightness of chest, and was diagnosed with MS hugs, her MRI was worsening at that time so Neurologist gave tegretol. Per patient it didn't really help any. But felt like the clonazepam was still helping.     No seasonal allergies. No asthma as a little kid, no family members with asthma.   Smoked one cigarette per day until 2 years ago.   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     Dry cough. Cough is a lot better since she's been on inhalers. Has a hoarse voice.   Sleeps on side. Tired all the time.   Can walk up a hill.     Feels chest tightness pressure all the time. Hard to breath out. Last fall was raking leaves, this fall couldn't. Both due to fatigue and shortness of breath.     Patient is unclear on whether the inhalers she has prescribed help or not  Ventolin taking 3 times a day.   Atrovent 3 times a day.   Takes Singulair PRN.           Past Medical History:   Diagnosis Date   ??? Diverticulitis large intestine 2000    with abscess and perforation with peritonitis   ??? Hypertension 2012   ??? 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 Osteoporosis      Past Surgical History:   Procedure Laterality Date   ??? CATARACT EXTRACTION Bilateral 2016    [place]   ??? COLECTOMY  2001    Partial   ??? EYE SURGERY Left 2012    Laser sx due to detached retina    ??? HYSTERECTOMY     ??? PR ALLOGRAFT FOR SPINE SURGERY ONLY MORSELIZED Midline 03/04/2017    Procedure: Allograft For Spine Surgery Only; Morselized;  Surgeon: Timothy Lasso, MD;  Location: MAIN OR [facility];  Service: Ortho Spine   ??? PR ANTERIOR INSTRUMENTATION 4-7 VERTEBRAL SEGMENTS Midline 03/04/2017    Procedure: Anterior Instrumentation; 4 To 7 Vertebral Segments;  Surgeon: Timothy Lasso, MD;  Location: MAIN OR [facility];  Service: Ortho Spine   ??? PR ARTHRODESIS ANT INTERBODY INC DISCECTOMY, CERVICAL BELOW C2 Midline 03/04/2017    Procedure: Arthrodes, Ant Intrbdy, Incl Disc Spc Prep, Discect, Osteophyt/Decompress Spinl Crd &/Or Nrv Rt, Crv Blo C2;  Surgeon: Timothy Lasso, MD;  Location: MAIN OR [facility];  Service: Ortho Spine   ??? PR ARTHRODESIS ANT INTERBODY MIN DISCECTOMY, CERVICAL BELOW C2  03/04/2017    Procedure: Arthrodesis, Anterior Interbody Technique, Include Minimal Diskectomy To Prep Interspace; Cervical Below C2;  Surgeon: Timothy Lasso, MD;  Location: MAIN OR [place];  Service: Ortho Spine   ??? PR ARTHRODESIS ANT INTERBODY MIN DISCECTOMY,EA ADDL  03/04/2017    Procedure: Arthrodesis, Anterior Interbody Technique, W/Minimal Diskectomy To Prep Interspace; Each Add`L Interspace;  Surgeon: Timothy Lasso, MD;  Location: MAIN OR [facility];  Service: Ortho Spine   ??? PR AUTOGRAFT SPINE SURGERY LOCAL FROM SAME INCISION Midline 03/04/2017    Procedure: Autograft/Spine Surg Only (W/Harvest Graft); Local (Eg, Rib/Spinous Proc, Ples Specter) Obtain From Same Incis;  Surgeon: Timothy Lasso, MD;  Location: MAIN OR [facility];  Service: Ortho Spine   ??? PR INSJ BIOMCHN DEV INTERVERTEBRAL DSC SPC W/ARTHRD Midline 03/04/2017    Procedure: Insert Interbody Biomechanical Device(S) With Integral Anterior Instrument For Device Anchoring, When Performed, To Intervertebral Disc Space In Conjunction With Interbody Arthrodesis, Each Interspace;  Surgeon: Timothy Lasso, MD; Location: MAIN OR [facility];  Service: Ortho Spine   ??? PR INSJ BIOMCHN DEV VRT CORPECTOMY DEFECT W/ARTHRD Midline 03/04/2017    Procedure: Insert Intervertebral Biomechanical Device(S) W Integral Anterior Instrument For Anchoring, When Performed, To Vert Corpectomy(Ies) Defect, In Conjunction W Interbody Arthrodesis, Each Contig Defect;  Surgeon: Timothy Lasso, MD;  Location: MAIN OR [facility];  Service: Ortho Spine   ??? PR REMV VERT BODY,CERV,ONE SGMT Midline 03/04/2017    Procedure: Vertebral Corpectomy-Ant W/Decomp; Cerv 1 Segmt;  Surgeon: Timothy Lasso, MD;  Location: MAIN OR [facility];  Service: Ortho Spine   ??? TONSILLECTOMY  1964   ??? TOTAL ABDOMINAL HYSTERECTOMY W/ BILATERAL SALPINGOOPHORECTOMY  2000       Other History:  The social history and family history were personally reviewed and updated in the patient's electronic medical record.    Family History   Problem Relation Age of Onset   ??? Depression Mother    ??? Hypertension Mother    ??? Hyperlipidemia Mother    ??? Stroke Father    ??? Hyperlipidemia Father    ??? Hypertension Father    ??? Hypertension Sister    ??? No Known Problems Brother    ??? No Known Problems Maternal Aunt    ??? No Known Problems Maternal Uncle    ??? No Known Problems Paternal Aunt    ??? No Known Problems Paternal Uncle    ??? Stroke Maternal Grandmother    ??? Hyperlipidemia Maternal Grandmother    ??? Hypertension Maternal Grandmother    ??? Cancer Maternal Grandfather    ??? Hyperlipidemia Maternal Grandfather    ??? Hypertension Maternal Grandfather    ??? Hyperlipidemia Paternal Grandmother    ??? Hyperlipidemia Paternal Grandfather    ??? Amblyopia Neg Hx    ??? Blindness Neg Hx    ??? Cataracts Neg Hx    ??? Diabetes Neg Hx    ??? Glaucoma Neg Hx    ??? Macular degeneration Neg Hx    ??? Retinal detachment Neg Hx    ??? Strabismus Neg Hx    ??? Thyroid disease Neg Hx      Social History     Socioeconomic History   ??? Marital status: Divorced     Spouse name: Not on file   ??? Number of children: Not on file   ??? Years of education: Not on file   ??? Highest education level: Not on file   Occupational History   ??? Not on file   Social Needs   ??? Financial resource strain: Not on file   ??? Food insecurity:     Worry: Not on file     Inability: Not on file   ???  Transportation needs:     Medical: Not on file     Non-medical: Not on file   Tobacco Use   ??? Smoking status: Former Smoker     Types: Cigarettes     Last attempt to quit: 01/19/2016     Years since quitting: 2.3   ??? Smokeless tobacco: Never Used   Substance and Sexual Activity   ??? Alcohol use: No   ??? Drug use: No   ??? Sexual activity: Not Currently   Lifestyle   ??? Physical activity:     Days per week: Not on file     Minutes per session: Not on file   ??? Stress: Not on file   Relationships   ??? Social connections:     Talks on phone: Not on file     Gets together: Not on file     Attends religious service: Not on file     Active member of club or organization: Not on file     Attends meetings of clubs or organizations: Not on file     Relationship status: Not on file   Other Topics Concern   ??? Not on file   Social History Narrative   ??? Not on file       Home Medications:  Current Outpatient Medications on File Prior to Visit   Medication Sig Dispense Refill   ??? albuterol HFA 90 mcg/actuation inhaler Inhale 2 puffs Every four (4) hours. As needed for cough, shortness of breath or wheezing 1 each 0   ??? baclofen (LIORESAL) 20 MG tablet TAKE 1 TABLET THREE TIMES DAILY 270 tablet 1   ??? buPROPion (WELLBUTRIN SR) 150 MG 12 hr tablet Take 1 tablet (150 mg total) by mouth Two (2) times a day. 180 tablet 1   ??? calcium carbonate/vitamin D3 (CALCIUM 500 + D ORAL) Take 1 tablet by mouth daily.     ??? carBAMazepine (CARBATROL) 200 MG 12 hr capsule Take 400 mg in the morning 200 mg at lunchtime and 400 mg in the evening. 150 capsule 2   ??? cholecalciferol, vitamin D3, 2,000 unit cap Take 4,000 Units by mouth daily.      ??? clonazePAM (KLONOPIN) 0.5 MG tablet Take 1 mg in the morning, 0.5 mg at lunchtime and 1 mg at bedtime. 150 tablet 2   ??? dimethyl fumarate 240 mg CpDR TAKE 1 CAPSULE BY MOUTH TWICE DAILY 60 capsule 5   ??? gabapentin (NEURONTIN) 300 MG capsule Take 1 capsule (300 mg total) by mouth Three (3) times a day. 270 capsule 1   ??? ipratropium (ATROVENT HFA) 17 mcg/actuation inhaler Inhale 2 puffs every six (6) hours as needed for wheezing or shortness of breath. 12.9 g 1   ??? Lactobacillus acidophilus (PROBIOTIC ORAL) Take 1 tablet by mouth daily.     ??? losartan (COZAAR) 25 MG tablet Take 1 tablet (25 mg total) by mouth daily. 90 tablet 3   ??? montelukast (SINGULAIR) 10 mg tablet TAKE 1 TABLET BY MOUTH IN THE MORNING  5   ??? multivitamin (TAB-A-VITE/THERAGRAN) per tablet Take 1 tablet by mouth daily.     ??? omeprazole (PRILOSEC) 20 MG capsule Take 1 capsule (20 mg total) by mouth daily. 90 capsule 3   ??? ondansetron (ZOFRAN-ODT) 4 MG disintegrating tablet Take 4 mg by mouth every eight (8) hours as needed for nausea.     ??? polyethylene glycol (GLYCOLAX) 17 gram/dose powder Take 17 g by mouth every other day.      ???  benzonatate (TESSALON) 200 MG capsule Take 1 capsule (200 mg total) by mouth Three (3) times a day as needed for cough. 30 capsule 0   ??? clobetasol (TEMOVATE) 0.05 % cream Apply 1 application topically Two (2) times a day.     ??? [DISCONTINUED] glatiramer 40 mg/mL Syrg Inject 1 mL (40 mg total) under the skin Three (3) times a week. 12 Syringe 2     No current facility-administered medications on file prior to visit.        Allergies:  Allergies as of 06/05/2018 - Reviewed 05/05/2018   Allergen Reaction Noted   ??? Amantadine Nausea Only 12/27/2012   ??? Codeine Itching 12/27/2012       Review of Systems:  A comprehensive review of systems was performed and negative except as indicated in the above history of present illness.    PHYSICAL EXAM:   BP 110/72  - Pulse 74  - Temp 36.4 ??C (Oral)  - Resp 14  - Wt 62.8 kg (138 lb 8 oz)  - SpO2 96% Comment: Resting on room air - BMI 22.29 kg/m??   General Appearance: appearing comfortable, non-toxic, in no distress, and stated age.   Eyes:  PERRL, conjunctiva clear, EOM's intact. As the visit progressed had some lid lagging. No drainage.   Ears:  Normal TM's and external ear canals bilaterally   Nose: Nares normal, septum midline. Normal mucosa.  No drainage or polyps.  No sinus tenderness.   Oropharynx: Moist mucus membranes without lesions or thrush.  Good dentition.  Posterior pharynx clear.   Respiratory:   Clear breath sounds b/l.  No crackles, wheezes, or rhonchi.  Easy work of breathing without accessory muscle use.     Cardiovascular:  Regular rate and rhythm. S1 and S2 normal. No murmur, rub  or gallop.  No JVD.  Pulses intact and symmetric.   No peripheral cyanosis or edema.   Gastrointestinal:   Abdomen soft, non-tender, and non-distended.  Normal bowel sounds.  No masses or organomegaly.   Musculoskeletal: No tenderness or deformity of the chest wall.  Joints normal.   No clubbing.   Skin: No rashes, lesions, skin changes.   Heme/Lymph: No cervical, supraclavicular, submandibular, or suprasternal adenopathy.  No bruising or petechiae.   Neurologic: Alert and oriented x3. No focal neurological deficits.  Normal gait.       LABORATORY and RADIOLOGY DATA:     Pulmonary Function Tests:  Pulmonary function testing personally reviewed and interpreted by myself. Discussed with the attending.   No obstruction. Consistent with moderate restriction. Normal DLCO.       Pertinent Laboratory Data:  Labs from 04/2018  Normal CO2  Normal eos  Venous blood gas 7.36/54    Pertinent Imaging Data:  Images personally reviewed and discussed with the attending.   04/26/2018  FINDINGS:  Bilateral anterior chest surgical clips. Cervical fusion hardware partially visualized.  Biapical pleural-parenchymal scarring. Otherwise clear lungs.  No pleural effusion or pneumothorax.  Unremarkable cardiomediastinal silhouette.   ??  IMPRESSION:  No acute airspace disease.      ASSESSMENT and PLAN Ashlee Christian is a 67 y.o. female with MS who presents with dyspnea. Most likely related to MS and neuromuscular weakness.     1. Will obtain lung volumes, MIP/MEP, and MVV to assess for neuromuscular weakness  2. Patient is up to date on flu vaccine  3. Advised patient she can d/c LAMA and try SABA alone. Likely does not need either, but the  lung volumes will help assess for air trapping.     The patient was seen with Dr. Roselyn Bering and will return to clinic in 3 months.    [id number] Edan Sondra Barges, MD    Henrene Dodge, MD  Pulmonary & Critical Care Fellow  June 05, 2018

## 2018-06-05 NOTE — Unmapped (Deleted)
duplicate

## 2018-06-05 NOTE — Unmapped (Signed)
Please take your medications as prescribed.  Thank you for allowing me to be a part of your care.  Please call the clinic with any questions.    Henrene Dodge, MD  Pulmonary and Critical Care Medicine  [address]  [id number]  [place], [place] [address]    Thank you for your visit to the [facility].  You may receive a survey from [facility] regarding your visit today, and we are eager to use this feedback to improve your experience.  Thank you for taking the time to fill it out.    Between appointments, you can reach [place] at these numbers:    For appointments or the Pulmonary Nurse: [phone]  Fax: [phone]    For urgent issues after hours:  Hospital Operator: [phone], ask for Pulmonary Fellow on call

## 2018-06-08 NOTE — Unmapped (Signed)
Per patient's feedback, would like to try another muscle relaxant.     Recommendations sent via mychart.    Sharmon Revere Dujmovic Basuroski

## 2018-06-08 NOTE — Unmapped (Signed)
I saw and evaluated the patient, participating in the key portions of the service.?? I reviewed the resident???s note.?? In addition to the resident's exam report, the patient's motor strength is 3-4/5 in all UE and LE groups. I agree with the resident???s findings and plan. Netty Starring, MD

## 2018-06-10 ENCOUNTER — Ambulatory Visit: Admit: 2018-06-10 | Discharge: 2018-06-11 | Payer: MEDICARE

## 2018-06-10 DIAGNOSIS — J988 Other specified respiratory disorders: Secondary | ICD-10-CM

## 2018-06-10 DIAGNOSIS — G35 Multiple sclerosis: Secondary | ICD-10-CM

## 2018-06-10 DIAGNOSIS — R942 Abnormal results of pulmonary function studies: Principal | ICD-10-CM

## 2018-06-10 DIAGNOSIS — R29898 Other symptoms and signs involving the musculoskeletal system: Secondary | ICD-10-CM

## 2018-06-12 MED ORDER — BENZONATATE 200 MG CAPSULE
ORAL_CAPSULE | Freq: Three times a day (TID) | ORAL | 0 refills | 0 days | Status: CP | PRN
Start: 2018-06-12 — End: 2018-07-26

## 2018-06-12 MED ORDER — TIZANIDINE 4 MG TABLET
ORAL_TABLET | 2 refills | 0 days | Status: CP
Start: 2018-06-12 — End: 2018-06-21

## 2018-06-12 NOTE — Unmapped (Signed)
RX Tizinidine, patient is switching from Baclofen to Tizanidine, as follows:    Week 1:  Baclofen 20 mg two times a day, for a week,  Tizanidine  4 mg : 1/2 pill two times a day for a week    Week 2:   Baclofen 20 mg nightly, for a week  Tizanidine  4 mg : 1/2 pill three times a day for a week    Week 3:  Baclofen 10 mg nightly for a week, then stop Baclofen  Tizanidine: 1 pill in the morning, 1/2 pill at lunchtime and 1 pill at bedtime, continue with this dose.      This recommendation is sent to the patient via Mychart.     Sharmon Revere Dujmovic Basuroski

## 2018-06-14 NOTE — Unmapped (Signed)
[facility] Refill Coordination Note  Specialty Medication(s): TECFIDERA      Ashlee Christian, DOB: 08-Sep-1950  Phone: [phone] (home) , Alternate phone contact: N/A  Phone or address changes today?: No  All above HIPAA information was verified with patient.  Shipping Address: [address]  [place] [address]   Insurance changes? No    Completed refill call assessment today to schedule patient's medication shipment from the [facility] [phone]).      Confirmed the medication and dosage are correct and have not changed: Yes, regimen is correct and unchanged.    Confirmed patient started or stopped the following medications in the past month:  No, there are no changes reported at this time.    Are you tolerating your medication?:  Ashlee Christian reports tolerating the medication.    ADHERENCE    (Below is required for Medicare Part B or Transplant patients only - per drug):   How many tablets were dispensed last month: 60  Patient currently has 2.5 WEEKS remaining.    Did you miss any doses in the past 4 weeks? No missed doses reported.    FINANCIAL/SHIPPING    Delivery Scheduled: Yes, Expected medication delivery date: 11/1     Medication will be delivered via Next Day Courier to the home address in [facility].    The patient will receive a drug information handout for each medication shipped and additional FDA Medication Guides as required.      Ashlee Christian did not have any additional questions at this time.    We will follow up with patient monthly for standard refill processing and delivery.      Thank you,  Westley Gambles   [facility] Shared [facility] Specialty Technician

## 2018-06-15 MED ORDER — CARBAMAZEPINE ER 200 MG CAPSULE,EXTENDED RELEASE MPHASE12HR
ORAL_CAPSULE | 2 refills | 0 days | Status: CP
Start: 2018-06-15 — End: 2018-09-30

## 2018-06-20 MED FILL — TECFIDERA 240 MG CAPSULE,DELAYED RELEASE: ORAL | 30 days supply | Qty: 60 | Fill #2

## 2018-06-20 MED FILL — TECFIDERA 240 MG CAPSULE,DELAYED RELEASE: 30 days supply | Qty: 60 | Fill #2 | Status: AC

## 2018-06-21 ENCOUNTER — Ambulatory Visit: Admit: 2018-06-21 | Discharge: 2018-06-22 | Payer: MEDICARE | Attending: Family Medicine | Primary: Family Medicine

## 2018-06-21 DIAGNOSIS — G35 Multiple sclerosis: Secondary | ICD-10-CM

## 2018-06-21 DIAGNOSIS — F321 Major depressive disorder, single episode, moderate: Principal | ICD-10-CM

## 2018-06-21 MED ORDER — BUPROPION HCL SR 200 MG TABLET,12 HR SUSTAINED-RELEASE
ORAL_TABLET | Freq: Two times a day (BID) | ORAL | 0 refills | 0.00000 days | Status: CP
Start: 2018-06-21 — End: 2018-07-26

## 2018-06-21 MED ORDER — BUPROPION HCL SR 200 MG TABLET,12 HR SUSTAINED-RELEASE: 200 mg | tablet | Freq: Two times a day (BID) | 0 refills | 0 days | Status: AC

## 2018-06-21 NOTE — Unmapped (Signed)
Pt is going thorough a rough patch with her depression.  She stated that she does not think it has ever been this bad.    She would like to make a sooner appointment to be seen by PCP      Please advise soon

## 2018-06-21 NOTE — Unmapped (Signed)
She can come in today at 15

## 2018-06-21 NOTE — Unmapped (Signed)
Assessment:        1. Current moderate episode of major depressive disorder without prior episode (CMS-HCC)    2. Multiple sclerosis (CMS-HCC)            Plan:        Patient instructions:  Increase Wellbutrin SR to 200mg  twice daily.    Follow up in 4-5  weeks to recheck depression.     Barriers to goals identified and addressed. Pertinent handouts were given today and reviewed with the patient as indicated.  The Care Plan and Self-Management goals have been included on the AVS and the AVS has been printed. Any outside resources or referrals needed at this time are noted above.  Any new medications prescribed have been discussed, and side effects have been addressed. Have assessed the patient's understanding, response, and barriers to adherence to medications. Patient voiced understanding and all questions have been answered to satisfaction.        Subjective:     Ashlee Christian is a 67 y.o. female.    HPI:   She is here for follow-up of depression.  She does not feel her depression is doing well at this time.  She is primarily concerned about her physical health related to her multiple sclerosis.  She is getting chest pressure and shortness of breath and has been worked up by both pulmonology and neurology.  The feeling is that she has weak chest wall muscles.  In the past she has had her Wellbutrin increased when she has not been doing well and that has seemed to helped.  She is tolerating Wellbutrin without side effects and would agree to a higher dosage.  Her PHQ-9 symptoms include:  Little interest or pleasure in doing things: More than half the days   Feeling down, depressed, or hopeless; (age 7-17) Feeling down, depressed, irritable, or hopeless: More than half the days   Trouble falling or staying asleep, or sleeping too much: More than half the days   Feeling tired or having little energy: More than half the days   Poor appetite or overeating; (age 69-17) Poor appetite, weight loss or overeating: More than half the days   Feeling bad about yourself - or that you are a failure or have let yourself or your family down; (age 57-17) Feeling bad about yourself - or feeling that you are a failure, or that you have let yourself or your family down: More than half the days    Trouble concentrating on things, such as reading the newspaper or watching television; (age 55-17) Trouble concentrating on things like schoolwork, reading or watching TV: More than half the days   Moving or speaking so slowly that other people could have noticed. Or the opposite - being so fidgety or restless that you have been moving around a lot more than usual: Not at all    Thoughts that you would be better off dead, or of hurting yourself in some way: Not at all   PHQ-9 TOTAL SCORE: 14        PHQ-9 Total Score Depression Severity:: Moderate   How difficult have these problems made it to do work, take care of things at home, or get along with other people?  Somewhat difficult    The following portions of the patient's history were reviewed and updated as appropriate: allergies, current medications, past family history, past medical history, past social history, past surgical history and problem list.    Review of Systems  Pertinent  items are noted in HPI.          Objective:      Physical Exam:  VITAL SIGNS: BP 124/80  - Pulse 88  - Temp 37 ??C (98.6 ??F) (Oral)  - Resp 18  - Ht 167.9 cm (5' 6.1)  - Wt 60.2 kg (132 lb 12.8 oz)  - BMI 21.37 kg/m??   Wt Readings from Last 6 Encounters:   06/21/18 60.2 kg (132 lb 12.8 oz)   06/05/18 62.8 kg (138 lb 8 oz)   04/29/18 61.4 kg (135 lb 6.4 oz)   04/23/18 61.8 kg (136 lb 3.2 oz)   03/20/18 61.8 kg (136 lb 3.2 oz)   03/11/18 61.1 kg (134 lb 12.8 oz)     GENERAL: Well appearing, alert, in NAD  HEAD: Normocephalic and atraumatic  NEUROLOGIC:  Alert, oriented to person, place and time. Cr N II-XII intact.   PSYCHOLOGIC: No evidence of anxiety.  Mood is subdued.   Appropriate affect    Laboratory Values: Results for orders placed or performed during the hospital encounter of 06/10/18   Lung Volumes via Plethysmography   Result Value Ref Range    IC PRE 1.19 L    FRC PL PRE 2.96 L    RV PRE 2.07 1.4804 - 3.0304 L    TLC PRE 4.14 (L) 4.298 - 6.452 L    RV/TLC PRE 49.82 31.638 - 53.638 %    VC PRE 2.08 (L) 2.592 - 1.61096 L    ERV PRE 0.9 (H) 0.8214 - 0.8214 L    PI Max PRE 9 (L) 04.540981191 - 47.829562130 cmH2O    PE Max Predicted 9 (L) 86.57846962 - 952.84132440102725 cmH2O   Maximum Insp/Exp Muscle Pressure Eval   Result Value Ref Range    IC PRE 1.19 L    FRC PL PRE 2.96 L    RV PRE 2.07 1.4804 - 3.0304 L    TLC PRE 4.14 (L) 4.298 - 6.452 L    RV/TLC PRE 49.82 31.638 - 53.638 %    VC PRE 2.08 (L) 2.592 - 3.66440 L    ERV PRE 0.9 (H) 0.8214 - 0.8214 L    PI Max PRE 9 (L) 34.742595638 - 75.643329518 cmH2O    PE Max Predicted 9 (L) 84.16606301 - 601.09323557322025 cmH2O

## 2018-06-22 NOTE — Unmapped (Addendum)
Increase Wellbutrin SR to 200mg  twice daily.    Follow up in 4-5  weeks to recheck depression.

## 2018-06-24 NOTE — Unmapped (Signed)
Amber, can you re-advise with a new time? Thanks

## 2018-07-05 NOTE — Unmapped (Signed)
Former Dr Cameron Ali patient calling about when she should have another scan of her back. Patient also stated that she is having hip pain and neck pain.

## 2018-07-08 NOTE — Unmapped (Signed)
Na nm

## 2018-07-12 NOTE — Unmapped (Signed)
[facility] Refill Coordination Note  Specialty Medication(s): TECFIDERA       JALAYSHA SKILTON, DOB: 12-09-1950  Phone: [phone] (home) , Alternate phone contact: N/A  Phone or address changes today?: No  All above HIPAA information was verified with patient.  Shipping Address: [address]  [place] [address] [place] [address]   Insurance changes? No    Completed refill call assessment today to schedule patient's medication shipment from the [facility] [phone]).      Confirmed the medication and dosage are correct and have not changed: Yes, regimen is correct and unchanged.    Confirmed patient started or stopped the following medications in the past month:  No, there are no changes reported at this time.    Are you tolerating your medication?:  Amayia reports tolerating the medication.    ADHERENCE        Did you miss any doses in the past 4 weeks? No missed doses reported.    FINANCIAL/SHIPPING    Delivery Scheduled: Yes, Expected medication delivery date: 12/6 PER PT REQUEST    Medication will be delivered via Next Day Courier to the home address in [facility].    The patient will receive a drug information handout for each medication shipped and additional FDA Medication Guides as required.      Lerin did not have any additional questions at this time.    We will follow up with patient monthly for standard refill processing and delivery.      Thank you,  Westley Gambles   [facility] Specialty Technician

## 2018-07-24 NOTE — Unmapped (Signed)
Nikki, please re-fax CBC/diff and CMP order to the PCP and inform the patient.   Thank you.  Sharmon Revere

## 2018-07-24 NOTE — Unmapped (Signed)
Re- Faxed CBC and CMP lab orders to PCP Carleene Cooper. Patient notified.    Lowella Bandy

## 2018-07-24 NOTE — Unmapped (Signed)
Thank you.  Ashlee Christian

## 2018-07-25 MED FILL — TECFIDERA 240 MG CAPSULE,DELAYED RELEASE: ORAL | 30 days supply | Qty: 60 | Fill #3

## 2018-07-25 MED FILL — TECFIDERA 240 MG CAPSULE,DELAYED RELEASE: 30 days supply | Qty: 60 | Fill #3 | Status: AC

## 2018-07-26 ENCOUNTER — Ambulatory Visit: Admit: 2018-07-26 | Discharge: 2018-07-27 | Payer: MEDICARE | Attending: Family Medicine | Primary: Family Medicine

## 2018-07-26 DIAGNOSIS — F4321 Adjustment disorder with depressed mood: Principal | ICD-10-CM

## 2018-07-26 DIAGNOSIS — G35 Multiple sclerosis: Secondary | ICD-10-CM

## 2018-07-26 MED ORDER — DULOXETINE 20 MG CAPSULE,DELAYED RELEASE
ORAL_CAPSULE | Freq: Two times a day (BID) | ORAL | 1 refills | 0 days | Status: CP
Start: 2018-07-26 — End: 2018-08-26

## 2018-07-26 MED ORDER — BENZONATATE 200 MG CAPSULE
ORAL_CAPSULE | Freq: Three times a day (TID) | ORAL | 3 refills | 0.00000 days | Status: CP | PRN
Start: 2018-07-26 — End: 2018-09-30

## 2018-07-26 MED ORDER — MELOXICAM 15 MG TABLET
ORAL_TABLET | Freq: Every day | ORAL | 1 refills | 0 days | Status: CP
Start: 2018-07-26 — End: 2019-01-20

## 2018-07-26 MED ORDER — BUPROPION HCL SR 150 MG TABLET,12 HR SUSTAINED-RELEASE
ORAL_TABLET | Freq: Two times a day (BID) | ORAL | 1 refills | 0 days | Status: CP
Start: 2018-07-26 — End: 2019-02-28

## 2018-07-26 NOTE — Unmapped (Signed)
Assessment:        1. Situational depression    2. Multiple sclerosis (CMS-HCC)            Plan:        Patient instructions:  Decrease Wellbutrin SR to 150mg  twice daily.    Start low dose Cymbalta (Duloxetine) 20mg  once each morning.     Continue your other medicines as you are doing.     Follow up in 1 month.    See Mindfulness resources    Barriers to goals identified and addressed. Pertinent handouts were given today and reviewed with the patient as indicated.  The Care Plan and Self-Management goals have been included on the AVS and the AVS has been printed. Any outside resources or referrals needed at this time are noted above.  Any new medications prescribed have been discussed, and side effects have been addressed. Have assessed the patient's understanding, response, and barriers to adherence to medications. Patient voiced understanding and all questions have been answered to satisfaction.        Subjective:     Ashlee Christian is a 67 y.o. female.    HPI:   She is here for follow-up of depression.  Since last seen she did not feel that the increase in Wellbutrin made her feel any better and she felt she may have even been a little bit more jittery.  Her PHQ-9 symptoms include:  Little interest or pleasure in doing things: Several days   Feeling down, depressed, or hopeless; (age 49-17) Feeling down, depressed, irritable, or hopeless: More than half the days   Trouble falling or staying asleep, or sleeping too much: Not at all   Feeling tired or having little energy: Nearly every day   Poor appetite or overeating; (age 74-17) Poor appetite, weight loss or overeating: Not at all   Feeling bad about yourself - or that you are a failure or have let yourself or your family down; (age 76-17) Feeling bad about yourself - or feeling that you are a failure, or that you have let yourself or your family down: More than half the days    Trouble concentrating on things, such as reading the newspaper or watching television; (age 45-17) Trouble concentrating on things like schoolwork, reading or watching TV: More than half the days   Moving or speaking so slowly that other people could have noticed. Or the opposite - being so fidgety or restless that you have been moving around a lot more than usual: Several days    Thoughts that you would be better off dead, or of hurting yourself in some way: Not at all   PHQ-9 TOTAL SCORE: 11        PHQ-9 Total Score Depression Severity:: Moderate   How difficult have these problems made it to do work, take care of things at home, or get along with other people?  Somewhat difficult    The following portions of the patient's history were reviewed and updated as appropriate: allergies, current medications, past family history, past medical history, past social history, past surgical history and problem list.    Review of Systems  Pertinent items are noted in HPI.          Objective:      Physical Exam:  VITAL SIGNS: BP 124/82  - Pulse 76  - Temp 36.8 ??C (98.2 ??F) (Oral)  - Resp 18  - Ht 167.9 cm (5' 6.1)  - Wt 60.4 kg (133 lb 3.2  oz)  - BMI 21.43 kg/m??   Wt Readings from Last 6 Encounters:   07/26/18 60.4 kg (133 lb 3.2 oz)   06/21/18 60.2 kg (132 lb 12.8 oz)   06/05/18 62.8 kg (138 lb 8 oz)   04/29/18 61.4 kg (135 lb 6.4 oz)   04/23/18 61.8 kg (136 lb 3.2 oz)   03/20/18 61.8 kg (136 lb 3.2 oz)     GENERAL: Well appearing, alert, in NAD  HEAD: Normocephalic and atraumatic  NEUROLOGIC:  Alert, oriented to person, place and time. Cr N II-XII intact. Normal speech.  No tremor is noted.  PSYCHOLOGIC: No evidence of anxiety or depression.  Normal mood.  Appropriate affect

## 2018-07-26 NOTE — Unmapped (Addendum)
Decrease Wellbutrin SR to 150mg  twice daily.    Start low dose Cymbalta (Duloxetine) 20mg  once each morning.     Continue your other medicines as you are doing.     Follow up in 1 month.    See Mindfulness resources:    Mindfulness Based Stress/Pain Reduction (MBSR) resources    Free on-line course  http://palousemindfulness.com/index.html    [place] MBSR program  BridgePointers.es-  mindfulness-based-stress-reduction    [facility] MBSR program  Blind[employer].ca    Mindful [place]  [email or web address]/mindfulness-[email or web address]

## 2018-07-30 NOTE — Unmapped (Signed)
Called the patient back but could only leave a voice message. I directed the patient to refer to the message sent to her on her MyChart portal.

## 2018-07-30 NOTE — Unmapped (Signed)
Patient called to say she forgot to get bllod work done at her PCP on 07/26/18. She said she can drive back over or she can have it drawn here on the 18th as [facility].    If that is not ok, Eulas Post, RN will call her back. Patient verbalized understanding.

## 2018-08-07 ENCOUNTER — Ambulatory Visit: Admit: 2018-08-07 | Discharge: 2018-08-07 | Payer: MEDICARE

## 2018-08-07 ENCOUNTER — Ambulatory Visit
Admit: 2018-08-07 | Discharge: 2018-08-07 | Payer: MEDICARE | Attending: Physician Assistant | Primary: Physician Assistant

## 2018-08-07 DIAGNOSIS — M5416 Radiculopathy, lumbar region: Principal | ICD-10-CM

## 2018-08-07 DIAGNOSIS — Z981 Arthrodesis status: Secondary | ICD-10-CM

## 2018-08-07 DIAGNOSIS — M5412 Radiculopathy, cervical region: Secondary | ICD-10-CM

## 2018-08-07 DIAGNOSIS — G959 Disease of spinal cord, unspecified: Principal | ICD-10-CM

## 2018-08-07 DIAGNOSIS — G35 Multiple sclerosis: Principal | ICD-10-CM

## 2018-08-07 LAB — COMPREHENSIVE METABOLIC PANEL
ALBUMIN: 4.1 g/dL (ref 3.5–5.0)
ALKALINE PHOSPHATASE: 55 U/L (ref 38–126)
ALT (SGPT): 13 U/L (ref <35)
AST (SGOT): 21 U/L (ref 14–38)
BILIRUBIN TOTAL: 0.3 mg/dL (ref 0.0–1.2)
BLOOD UREA NITROGEN: 16 mg/dL (ref 7–21)
BUN / CREAT RATIO: 30
CALCIUM: 8.8 mg/dL (ref 8.5–10.2)
CHLORIDE: 90 mmol/L — ABNORMAL LOW (ref 98–107)
CO2: 32 mmol/L — ABNORMAL HIGH (ref 22.0–30.0)
CREATININE: 0.53 mg/dL — ABNORMAL LOW (ref 0.60–1.00)
EGFR CKD-EPI AA FEMALE: >90 mL/min/{1.73_m2} (ref >=60)
GLUCOSE RANDOM: 88 mg/dL (ref 70–179)
POTASSIUM: 4.7 mmol/L (ref 3.5–5.0)
PROTEIN TOTAL: 6.7 g/dL (ref 6.5–8.3)
SODIUM: 128 mmol/L — ABNORMAL LOW (ref 135–145)

## 2018-08-07 LAB — CBC W/ AUTO DIFF
BASOPHILS ABSOLUTE COUNT: 0 10*9/L (ref 0.0–0.1)
BASOPHILS RELATIVE PERCENT: 0.6 %
EOSINOPHILS ABSOLUTE COUNT: 0.1 10*9/L (ref 0.0–0.4)
EOSINOPHILS RELATIVE PERCENT: 3.1 %
HEMATOCRIT: 39.7 % (ref 36.0–46.0)
LARGE UNSTAINED CELLS: 1 % (ref 0–4)
LYMPHOCYTES ABSOLUTE COUNT: 0.7 10*9/L — ABNORMAL LOW (ref 1.5–5.0)
LYMPHOCYTES RELATIVE PERCENT: 21.1 %
MEAN CORPUSCULAR HEMOGLOBIN CONC: 33.5 g/dL (ref 31.0–37.0)
MEAN CORPUSCULAR HEMOGLOBIN: 32.8 pg (ref 26.0–34.0)
MEAN CORPUSCULAR VOLUME: 98 fL (ref 80.0–100.0)
MEAN PLATELET VOLUME: 7.3 fL (ref 7.0–10.0)
MONOCYTES ABSOLUTE COUNT: 0.3 10*9/L (ref 0.2–0.8)
NEUTROPHILS ABSOLUTE COUNT: 2 10*9/L (ref 2.0–7.5)
NEUTROPHILS RELATIVE PERCENT: 63.8 %
PLATELET COUNT: 355 10*9/L (ref 150–440)
RED BLOOD CELL COUNT: 4.06 10*12/L (ref 4.00–5.20)
RED CELL DISTRIBUTION WIDTH: 12.7 % (ref 12.0–15.0)
WBC ADJUSTED: 3.1 10*9/L — ABNORMAL LOW (ref 4.5–11.0)

## 2018-08-07 LAB — HEMATOCRIT: Lab: 39.7

## 2018-08-07 LAB — POTASSIUM: Potassium:SCnc:Pt:Ser/Plas:Qn:: 4.7

## 2018-08-07 NOTE — Unmapped (Signed)
Thank-you for coming to the [facility].     ?? Recommendations/Plan  ?? Medications: Take over-the-counter medications as needed and as tolerated  ?? Take medications as precribed by your Medical Team  ?? Gradual cautious return to normal activities.  ?? Follow-up: As needed.  Please contact us via MyChart or phone if any problems arise.     ?? Contact my nurse, Beth Soileau at  [phone] for any questions/concerns. Voicemail is checked Monday-Friday from 8:00 am- 4:00 pm    ?? To schedule an appointment with Vashaun Osmon, PA-C at the [facility] please call: [phone]  ?? To schedule an appointment with Dennie Vecchio, PA-C at [facility] [address]) please call: [phone])    ?? Please go to our website to learn more.   [email or web address]

## 2018-08-07 NOTE — Unmapped (Signed)
ORTHOPAEDIC SPINE CLINIC NOTE                 Patient Name: Ashlee Christian  MRN: [id number]  DOB: [birth year]    Date of Evaluation: 08/07/2018    PCP: Garnette Czech, MD    ASSESSMENT:     Ashlee Christian is a 67 y.o. 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 Multiple sclerosis  3. Lumbar radiculopathy  4. Right trochanteric bursitis    PLAN:   The diagnosis and treatment plan were discussed with the patient and her son.  Lumbar MRI was reviewed.  EMG results were reviewed.  At this time patient symptoms are very minimal in her lumbar spine.  She continues to do well regarding her postoperative course from her ACDF.  She will therefore continue conservative treatment.  She will be seen on an as-needed basis.    ?? Recommendations/Plan  ?? Medications: Take over-the-counter medications as needed and as tolerated  ?? Take medications as precribed by your Medical Team  ?? Gradual cautious return to normal activities.  ?? Follow-up: As needed.  Please contact us via MyChart or phone if any problems arise.     I reviewed the diagnosis and discussed treatment options with the patient. The patient is amenable to the above plan. The patient was instructed to call or return to clinic if symptoms fail to improve as expected, there is any increasing pain, or any other concerns.    SUBJECTIVE:     History of Present Illness:   [name] is a 67 y.o. female seen at the [facility] for evaluation of their chief complaint of postoperative follow-up and follow-up of low back and radicular pain.  Ms. Vonita Moss underwent a C4-7 ACDF with Dr. Cameron Ali on 03/04/2017.  Her last office visit with Dr. Cameron Ali was 12/24/2017.  At that visit they discussed the complexity of her neurologic symptoms postoperatively given her MS.  An EMG was ordered to further evaluate for possible carpal tunnel due to her right hand numbness.  A lumbar MRI was ordered due to her right lower extremity radicular symptoms.  She did not return to follow-up with Dr. Cameron Ali after the studies.  She reports that the right lower extremity radicular symptoms she was experiencing at that time have resolved.  She reports for short period of time the pain moved to her left leg but that has since resolved.  She is only noting mild axial low back pain.  She continues to note numbness and paresthesia of her right hand.  EMG was obtained in July and was negative for carpal tunnel.  She reports that she was recently started on Cymbalta and continues to take meloxicam which seems to be helping significantly with her symptoms.  She reports no neck pain and no radicular pain into the upper extremity.    Pain score 0-10 (as reported on intake): Did not report  Character: stiffness  Location: lumbar: axial and cervical: axial  Modifying factors: worse with standing, worse with walking and worse with activity    Associated symptoms: numbness/tingling  Bowel/bladder dysfunction: denies  Recent unintentional weight loss: denies  Fever/chills: denies      Medical History   Past Medical History:   Diagnosis Date   ??? Diverticulitis large intestine 2000    with abscess and perforation with peritonitis   ??? Hypertension 2012   ???  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 Osteoporosis         Surgical History   Past Surgical History:   Procedure Laterality Date   ??? CAT[place]ACT EXTRACTION Bilateral 2016    [place]   ??? COLECTOMY  2001    Partial   ??? EYE SURGERY Left 2012    Laser sx due to detached retina    ??? HYSTERECTOMY     ??? PR ALLOGRAFT FOR SPINE SURGERY ONLY MORSELIZED Midline 03/04/2017    Procedure: Allograft For Spine Surgery Only; Morselized;  Surgeon: Timothy Lasso, MD;  Location: MAIN OR [facility];  Service: Ortho Spine   ??? PR ANTERIOR INSTRUMENTATION 4-7 VERTEBRAL SEGMENTS Midline 03/04/2017    Procedure: Anterior Instrumentation; 4 To 7 Vertebral Segments;  Surgeon: Timothy Lasso, MD;  Location: MAIN OR [facility];  Service: Ortho Spine   ??? PR ARTHRODESIS ANT INTERBODY INC DISCECTOMY, CERVICAL BELOW C2 Midline 03/04/2017    Procedure: Arthrodes, Ant Intrbdy, Incl Disc Spc Prep, Discect, Osteophyt/Decompress Spinl Crd &/Or Nrv Rt, Crv Blo C2;  Surgeon: Timothy Lasso, MD;  Location: MAIN OR [facility];  Service: Ortho Spine   ??? PR ARTHRODESIS ANT INTERBODY MIN DISCECTOMY, CERVICAL BELOW C2  03/04/2017    Procedure: Arthrodesis, Anterior Interbody Technique, Include Minimal Diskectomy To Prep Interspace; Cervical Below C2;  Surgeon: Timothy Lasso, MD;  Location: MAIN OR [facility];  Service: Ortho Spine   ??? PR ARTHRODESIS ANT INTERBODY MIN DISCECTOMY,EA ADDL  03/04/2017    Procedure: Arthrodesis, Anterior Interbody Technique, W/Minimal Diskectomy To Prep Interspace; Each Add`L Interspace;  Surgeon: Timothy Lasso, MD;  Location: [facility];  Service: Ortho Spine   ??? PR AUTOGRAFT SPINE SURGERY LOCAL FROM SAME INCISION Midline 03/04/2017    Procedure: Autograft/Spine Surg Only (W/Harvest Graft); Local (Eg, Rib/Spinous Proc, Ples Specter) Obtain From Same Incis;  Surgeon: Timothy Lasso, MD;  Location: MAIN OR [facility];  Service: Ortho Spine   ??? PR INSJ BIOMCHN DEV INTERVERTEBRAL DSC SPC W/ARTHRD Midline 03/04/2017    Procedure: Insert Interbody Biomechanical Device(S) With Integral Anterior Instrument For Device Anchoring, When Performed, To Intervertebral Disc Space In Conjunction With Interbody Arthrodesis, Each Interspace;  Surgeon: Timothy Lasso, MD;  Location: MAIN OR [facility];  Service: Ortho Spine   ??? PR INSJ BIOMCHN DEV VRT CORPECTOMY DEFECT W/ARTHRD Midline 03/04/2017    Procedure: Insert Intervertebral Biomechanical Device(S) W Integral Anterior Instrument For Anchoring, When Performed, To Vert Corpectomy(Ies) Defect, In Conjunction W Interbody Arthrodesis, Each Contig Defect;  Surgeon: Timothy Lasso, MD; Location: MAIN OR [facility];  Service: Ortho Spine   ??? PR REMV VERT BODY,CERV,ONE SGMT Midline 03/04/2017    Procedure: Vertebral Corpectomy-Ant W/Decomp; Cerv 1 Segmt;  Surgeon: Timothy Lasso, MD;  Location: MAIN OR [facility];  Service: Ortho Spine   ??? TONSILLECTOMY  1964   ??? TOTAL ABDOMINAL HYSTERECTOMY W/ BILATERAL SALPINGOOPHORECTOMY  2000        Allergies Amantadine and Codeine   Medications   Current Outpatient Medications   Medication Sig Dispense Refill   ??? baclofen (LIORESAL) 20 MG tablet TAKE 1 TABLET THREE TIMES DAILY 270 tablet 1   ??? benzonatate (TESSALON) 200 MG capsule Take 1 capsule (200 mg total) by mouth Three (3) times a day as needed for cough. 30 capsule 3   ??? BIOTIN ORAL Take 1 tablet by mouth once daily.     ??? buPROPion (WELLBUTRIN SR) 150 MG 12 hr tablet Take 1 tablet (  150 mg total) by mouth Two (2) times a day. 180 tablet 1   ??? calcium carbonate/vitamin D3 (CALCIUM 500 + D ORAL) Take 1 tablet by mouth daily.     ??? carBAMazepine (CARBATROL) 200 MG 12 hr capsule TAKE 2 CAPSULES EVERY MORNING, 1 CAPSULE AT LUNCHTIME, AND 2 CAPSULES IN THE EVENING 450 capsule 2   ??? cholecalciferol, vitamin D3, 2,000 unit cap Take 4,000 Units by mouth daily.      ??? clobetasol (TEMOVATE) 0.05 % cream Apply 1 application topically Two (2) times a day.     ??? clonazePAM (KLONOPIN) 0.5 MG tablet Take 1 mg in the morning, 0.5 mg at lunchtime and 1 mg at bedtime. 150 tablet 2   ??? dimethyl fumarate 240 mg CpDR TAKE 1 CAPSULE BY MOUTH TWICE DAILY 60 capsule 5   ??? DULoxetine (CYMBALTA) 20 MG capsule Take 1 capsule (20 mg total) by mouth Two (2) times a day. 30 capsule 1   ??? gabapentin (NEURONTIN) 300 MG capsule Take 1 capsule (300 mg total) by mouth Three (3) times a day. 270 capsule 1   ??? ipratropium (ATROVENT HFA) 17 mcg/actuation inhaler Inhale 2 puffs every six (6) hours as needed for wheezing or shortness of breath. 12.9 g 1   ??? Lactobacillus acidophilus (PROBIOTIC ORAL) Take 1 tablet by mouth daily.     ??? losartan (COZAAR) 25 MG tablet Take 1 tablet (25 mg total) by mouth daily. 90 tablet 3   ??? meloxicam (MOBIC) 15 MG tablet Take 1 tablet (15 mg total) by mouth daily. 90 tablet 1   ??? montelukast (SINGULAIR) 10 mg tablet TAKE 1 TABLET BY MOUTH IN THE MORNING  5   ??? multivitamin (TAB-A-VITE/THERAGRAN) per tablet Take 1 tablet by mouth daily.     ??? omeprazole (PRILOSEC) 20 MG capsule Take 1 capsule (20 mg total) by mouth daily. 90 capsule 3   ??? polyethylene glycol (GLYCOLAX) 17 gram/dose powder Take 17 g by mouth every other day.      ??? NARCAN 4 mg/actuation nasal spray CALL 911. ADMINISTER A SINGLE SPRAY OF NARCAN IN ONE NOSTRIL. REPEAT EVERY 3 MINUTES AS NEEDED IF NO OR MINIMAL RESPONSE.  0     No current facility-administered medications for this visit.       Review of Systems Pertinent positives and negatives are documented in the HPI. All other systems reviewed are negative. Patient was instructed to follow-up with the appropriate provider as necessary for all pertinent positives not related to today's encounter.     Family History Bleeding or blood clotting disorders: denies  Problems with anesthesia: denies  Family History   Problem Relation Age of Onset   ??? Depression Mother    ??? Hypertension Mother    ??? Hyperlipidemia Mother    ??? Stroke Father    ??? Hyperlipidemia Father    ??? Hypertension Father    ??? Hypertension Sister    ??? No Known Problems Brother    ??? No Known Problems Maternal Aunt    ??? No Known Problems Maternal Uncle    ??? No Known Problems Paternal Aunt    ??? Alcohol abuse Paternal Uncle    ??? Stroke Maternal Grandmother    ??? Hyperlipidemia Maternal Grandmother    ??? Hypertension Maternal Grandmother    ??? Cancer Maternal Grandfather    ??? Hyperlipidemia Maternal Grandfather    ??? Hypertension Maternal Grandfather    ??? Hyperlipidemia Paternal Grandmother    ??? Hyperlipidemia Paternal Grandfather    ???  Amblyopia Neg Hx    ??? Blindness Neg Hx    ??? Cataracts Neg Hx    ??? Diabetes Neg Hx    ??? Glaucoma Neg Hx    ??? Macular degeneration Neg Hx    ??? Retinal detachment Neg Hx    ??? Strabismus Neg Hx    ??? Thyroid disease Neg Hx           Social History She reports that she quit smoking about 2 years ago. Her smoking use included cigarettes. She has never used smokeless tobacco. She reports that she does not drink alcohol or use drugs.Home address:[address]  [place] [address]        Patient does not qualify to have social determinant information on file (likely too young).   Occupational History   ??? Not on file    Employment: retired  Social History     Patient does not qualify to have social determinant information on file (likely too young).   Social History Narrative   ??? Not on file        The history recorded in the table above was reviewed.    OBJECTIVE:     PHYSICAL EXAM:  Vitals: BP 127/71  - Pulse 66  - Temp 36.6 ??C (97.8 ??F)  - Ht 167.9 cm (5' 6.1)  - Wt 61 kg (134 lb 6.4 oz)  - BMI 21.63 kg/m??       Appearance: well-nourished and no acute distress    Oriented to: time, place and person   Affect: alert and cooperative       Gait: normal  Straight Line Tandem Gait: tandem gait unable due to unsteadiness  Romberg: negative       Cervical Spine: ROM: mildly restricted  Palpation:normal skin, diffusely tender to palpation .  Healed incision  Lymphadenopathy:none       Lumbar Spine: Palpation: non-tender to palpation, normal skin       Shoulder: Right: Negative impingement. No atrophy noted upon inspection. Normal elevation ROM and stability. Normal skin.  Left: Negative impingement. No atrophy noted upon inspection. Normal elevation ROM and stability. Normal skin.       Hand: Right: Negative carpal tunnel compression test. No atrophy noted upon inspection. Normal ROM and stability. Normal skin.  Left: Negative carpal tunnel compression test. No atrophy noted upon inspection. Normal ROM and stability. Normal skin.       Hip: Right: No pain with hip internal rotation/loading. Normal internal rotation ROM.  Left: No pain with hip internal rotation/loading. Normal internal rotation ROM.       Knee: Right: No knee pain with flexion/extension. No atrophy noted upon inspection. Normal skin. Normal ROM and stability.  Left: No knee pain with flexion/extension. No atrophy noted upon inspection. Normal skin. Normal ROM and stability.       Special Tests:  Lhermitte's sign: negative  Spurling's sign: negative    Grip release test: WNL  Fine motor: WNL    Nerve Tension Signs: SLR negative and FN stretch negative        Skin: Normal skin in the neck, back, bilat hands, and bilat feet., No cyanosis or clubbing in bilat hands. and No edema in the feet.  Pulses: 2+ DP pulses bilaterally     Sensation: Intact to light touch in the hands and feet.     ??  Motor R L   Deltoid 4 4   Bicep 4 4   Tricep 4 4   WE  4 4   Grip 4 4   IO 4 4   IP 3 4   Quad 4+ 4+   TA 2 4+   EHL 2 4+   GS 4 4+   ??    MEDICAL DECISION MAKING    Test Results:  Imaging:   AP, Lateral, Flexion, Extension Views of the Cervical Spine- obtained today demonstrate  anterior instrumented fusion at C4-C7 without evidence of hardware-related complication.    MRI of the lumbar spine obtained 02/05/2018 demonstrates new moderate right neural foraminal narrowing at L4-L5 with likely extraforaminal abutment of the exiting right L4 nerve root. Otherwise, multilevel degenerative changes as detailed above worst at L4-L5 with moderate canal narrowing and bilateral facetitis are similar in appearance to the prior study of 10/12/2016    Discussion:  ?? Clinical findings, diagnostic/treatment options, and plan were discussed with the patient.  ?? Conservative Care - Options discussed.  ?? Conservative Care - Warning symptoms were discussed.     cc: Timothy Lasso, *, Garnette Czech, MD

## 2018-08-10 MED ORDER — CLONAZEPAM 0.5 MG TABLET
ORAL_TABLET | 2 refills | 0 days | Status: CP
Start: 2018-08-10 — End: 2018-09-30

## 2018-08-10 NOTE — Unmapped (Signed)
Refilled Clonazepam.    Sarita Bottom, MD

## 2018-08-10 NOTE — Unmapped (Signed)
I refilled Clonazepam. Thank you.  Sharmon Revere

## 2018-08-16 NOTE — Unmapped (Signed)
[facility] Refill Coordination Note    Specialty Medication(s) to be Shipped:   Neurology: Tecfidera    Other medication(s) to be shipped: n/a     Ashlee Christian, DOB: Aug 06, 1951  Phone: [phone] (home)       All above HIPAA information was verified with patient.     Completed refill call assessment today to schedule patient's medication shipment from the [facility] [phone]).       Specialty medication(s) and dose(s) confirmed: Regimen is correct and unchanged.   Changes to medications: Trust reports no changes reported at this time.  Changes to insurance: No  Questions for the pharmacist: No    The patient will receive a drug information handout for each medication shipped and additional FDA Medication Guides as required.      DISEASE/MEDICATION-SPECIFIC INFORMATION        N/A    SPECIALTY MEDICATION ADHERENCE     Medication Adherence    Patient reported X missed doses in the last month:  0  Specialty Medication:  tecfidera 240mg   Patient is on additional specialty medications:  No  Patient is on more than two specialty medications:  No  Any gaps in refill history greater than 2 weeks in the last 3 months:  no  Demonstrates understanding of importance of adherence:  yes  Informant:  patient  Reliability of informant:  reliable              Confirmed plan for next specialty medication refill:  delivery by pharmacy  Refills needed for supportive medications:  not needed          Refill Coordination    Has the Patients' Contact Information Changed:  No  Is the Shipping Address Different:  No           tecfidera 240mg  cpdr: patient has 7 days of medication on hand      SHIPPING     Shipping address confirmed in Epic.     Delivery Scheduled: Yes, Expected medication delivery date: 12/31.     Medication will be delivered via Next Day Courier to the home address in Epic WAM.    [name]   [facility] Specialty Technician

## 2018-08-19 MED FILL — TECFIDERA 240 MG CAPSULE,DELAYED RELEASE: 30 days supply | Qty: 60 | Fill #4 | Status: AC

## 2018-08-19 MED FILL — TECFIDERA 240 MG CAPSULE,DELAYED RELEASE: ORAL | 30 days supply | Qty: 60 | Fill #4

## 2018-08-22 NOTE — Unmapped (Addendum)
Good work with being proactive in your mental health.     Continue your present medicines.     See Mindfulness resources:    Follow up in 3 months    Mindfulness Based Stress/Pain Reduction (MBSR) resources    Free on-line course  http://[email or web address].[email or web address].html    [place] MBSR program  [email or web address]-  mindfulness-based-stress-reduction    [facility] MBSR program  [email or web address]

## 2018-08-26 ENCOUNTER — Ambulatory Visit: Admit: 2018-08-26 | Discharge: 2018-08-27 | Payer: MEDICARE | Attending: Family Medicine | Primary: Family Medicine

## 2018-08-26 DIAGNOSIS — F4321 Adjustment disorder with depressed mood: Principal | ICD-10-CM

## 2018-08-26 DIAGNOSIS — F320 Major depressive disorder, single episode, mild: Secondary | ICD-10-CM

## 2018-08-26 MED ORDER — DULOXETINE 20 MG CAPSULE,DELAYED RELEASE
ORAL_CAPSULE | Freq: Every day | ORAL | 1 refills | 0.00000 days | Status: CP
Start: 2018-08-26 — End: 2018-10-30

## 2018-08-26 NOTE — Unmapped (Signed)
Assessment:        1. Situational depression    2. Current mild episode of major depressive disorder without prior episode (CMS-HCC)            Plan:        Patient instructions:   Good work with being proactive in your mental health.     Continue your present medicines.     See Mindfulness resources:    Follow up in 3 months    Barriers to goals identified and addressed. Pertinent handouts were given today and reviewed with the patient as indicated.  The Care Plan and Self-Management goals have been included on the AVS and the AVS has been printed. Any outside resources or referrals needed at this time are noted above.  Any new medications prescribed have been discussed, and side effects have been addressed. Have assessed the patient's understanding, response, and barriers to adherence to medications. Patient voiced understanding and all questions have been answered to satisfaction.        Subjective:     Ashlee Christian is a 68 y.o. female.    HPI:   She is here for follow-up of depression.  Since last seen she has been feeling somewhat better.  She is started on Cymbalta and is tolerating 20 mg daily.  Attempts to increase the dosage were met with intolerable side effects.  She is also spoken with someone in [facility] regarding working on mindfulness.  She is hopeful that this will help her.  She did have a good holiday with visits from her children.  She continues to have her Parkinson symptoms but she feels she is tolerating the morning hugs and sensation of breathlessness she has.  Her PHQ-9 symptoms include:  Little interest or pleasure in doing things: Several days   Feeling down, depressed, or hopeless; (age 60-17) Feeling down, depressed, irritable, or hopeless: Several days   Trouble falling or staying asleep, or sleeping too much: Not at all   Feeling tired or having little energy: Several days   Poor appetite or overeating; (age 75-17) Poor appetite, weight loss or overeating: Not at all Feeling bad about yourself - or that you are a failure or have let yourself or your family down; (age 65-17) Feeling bad about yourself - or feeling that you are a failure, or that you have let yourself or your family down: Several days    Trouble concentrating on things, such as reading the newspaper or watching television; (age 11-17) Trouble concentrating on things like schoolwork, reading or watching TV: Not at all   Moving or speaking so slowly that other people could have noticed. Or the opposite - being so fidgety or restless that you have been moving around a lot more than usual: Not at all    Thoughts that you would be better off dead, or of hurting yourself in some way: Not at all   PHQ-9 TOTAL SCORE: 4        PHQ-9 Total Score Depression Severity:: None-Minimal   How difficult have these problems made it to do work, take care of things at home, or get along with other people?  Somewhat difficult    The following portions of the patient's history were reviewed and updated as appropriate: allergies, current medications, past family history, past medical history, past social history, past surgical history and problem list.    Review of Systems  Pertinent items are noted in HPI.  Objective:      Physical Exam:  VITAL SIGNS: BP 104/60  - Pulse 76  - Temp 36.8 ??C (98.3 ??F) (Oral)  - Resp 18  - Ht 167.9 cm (5' 6.1)  - Wt 60.5 kg (133 lb 6.4 oz) Comment: with shoes - BMI 21.47 kg/m??   Wt Readings from Last 6 Encounters:   08/26/18 60.5 kg (133 lb 6.4 oz)   08/07/18 61 kg (134 lb 6.4 oz)   07/26/18 60.4 kg (133 lb 3.2 oz)   06/21/18 60.2 kg (132 lb 12.8 oz)   06/05/18 62.8 kg (138 lb 8 oz)   04/29/18 61.4 kg (135 lb 6.4 oz)     GENERAL: Well appearing, alert, in NAD  HEAD: Normocephalic and atraumatic  NEUROLOGIC:  Alert, oriented to person, place and time. Cr N II-XII intact. Normal speech and strength. No focal findings  PSYCHOLOGIC: No evidence of anxiety or depression.  Normal mood.  Appropriate affect.  She appears much less distressed today.  Good eye contact.    Laboratory Values:  Results for orders placed or performed in visit on 08/07/18   Comprehensive Metabolic Panel   Result Value Ref Range    Sodium 128 (L) 135 - 145 mmol/L    Potassium 4.7 3.5 - 5.0 mmol/L    Chloride 90 (L) 98 - 107 mmol/L    CO2 32.0 (H) 22.0 - 30.0 mmol/L    BUN 16 7 - 21 mg/dL    Creatinine 1.30 (L) 0.60 - 1.00 mg/dL    BUN/Creatinine Ratio 30     EGFR CKD-EPI Non-African American, Female >90 >=60 mL/min/1.38m2    EGFR CKD-EPI African American, Female >90 >=60 mL/min/1.31m2    Glucose 88 70 - 179 mg/dL    Calcium 8.8 8.5 - 86.5 mg/dL    Albumin 4.1 3.5 - 5.0 g/dL    Total Protein 6.7 6.5 - 8.3 g/dL    Total Bilirubin 0.3 0.0 - 1.2 mg/dL    AST 21 14 - 38 U/L    ALT 13 <35 U/L    Alkaline Phosphatase 55 38 - 126 U/L    Anion Gap 6 (L) 7 - 15 mmol/L   CBC w/ Differential   Result Value Ref Range    WBC 3.1 (L) 4.5 - 11.0 10*9/L    RBC 4.06 4.00 - 5.20 10*12/L    HGB 13.3 12.0 - 16.0 g/dL    HCT 78.4 69.6 - 29.5 %    MCV 98.0 80.0 - 100.0 fL    MCH 32.8 26.0 - 34.0 pg    MCHC 33.5 31.0 - 37.0 g/dL    RDW 28.4 13.2 - 44.0 %    MPV 7.3 7.0 - 10.0 fL    Platelet 355 150 - 440 10*9/L    Neutrophils % 63.8 %    Lymphocytes % 21.1 %    Monocytes % 10.0 %    Eosinophils % 3.1 %    Basophils % 0.6 %    Absolute Neutrophils 2.0 2.0 - 7.5 10*9/L    Absolute Lymphocytes 0.7 (L) 1.5 - 5.0 10*9/L    Absolute Monocytes 0.3 0.2 - 0.8 10*9/L    Absolute Eosinophils 0.1 0.0 - 0.4 10*9/L    Absolute Basophils 0.0 0.0 - 0.1 10*9/L    Large Unstained Cells 1 0 - 4 %    Macrocytosis Slight (A) Not Present

## 2018-09-04 NOTE — Unmapped (Deleted)
Pulmonary Clinic - Initial Visit    Referring Physician :  Felicity Coyer  PCP:     Garnette Czech, MD  Reason for Consult:   Chest tightness         HISTORY:     History of Present Illness:  Ashlee Christian is a 68 y.o. female with a history of MS who presents to clinic with chest tightness.     Two and a half years ago, January 2017, shortness of breath, especially when she woke up in the morning. Thought it was related to anxiety at the time due to problems with her marriage. As she got up and got around it would get better. PCP prescribed clonazepam, which worked well for her.    In September 2017 saw her Neurologist, complaining of tightness of chest, and was diagnosed with MS hugs, her MRI was worsening at that time so Neurologist gave tegretol. Per patient it didn't really help any. But felt like the clonazepam was still helping.     No seasonal allergies. No asthma as a little kid, no family members with asthma.   Smoked one cigarette per day until 2 years ago.   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     Dry cough. Cough is a lot better since she's been on inhalers. Has a hoarse voice.   Sleeps on side. Tired all the time.   Can walk up a hill.     Feels chest tightness pressure all the time. Hard to breath out. Last fall was raking leaves, this fall couldn't. Both due to fatigue and shortness of breath.     Patient is unclear on whether the inhalers she has prescribed help or not  Ventolin taking 3 times a day.   Atrovent 3 times a day.   Takes Singulair PRN.           Past Medical History:   Diagnosis Date   ??? Diverticulitis large intestine 2000    with abscess and perforation with peritonitis   ??? Hypertension 2012   ??? 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 Osteoporosis      Past Surgical History:   Procedure Laterality Date   ??? CATARACT EXTRACTION Bilateral 2016    [place]   ??? COLECTOMY  2001    Partial   ??? EYE SURGERY Left 2012    Laser sx due to detached retina ??? HYSTERECTOMY     ??? PR ALLOGRAFT FOR SPINE SURGERY ONLY MORSELIZED Midline 03/04/2017    Procedure: Allograft For Spine Surgery Only; Morselized;  Surgeon: Timothy Lasso, MD;  Location: MAIN OR [facility];  Service: Ortho Spine   ??? PR ANTERIOR INSTRUMENTATION 4-7 VERTEBRAL SEGMENTS Midline 03/04/2017    Procedure: Anterior Instrumentation; 4 To 7 Vertebral Segments;  Surgeon: Timothy Lasso, MD;  Location: MAIN OR [facility];  Service: Ortho Spine   ??? PR ARTHRODESIS ANT INTERBODY INC DISCECTOMY, CERVICAL BELOW C2 Midline 03/04/2017    Procedure: Arthrodes, Ant Intrbdy, Incl Disc Spc Prep, Discect, Osteophyt/Decompress Spinl Crd &/Or Nrv Rt, Crv Blo C2;  Surgeon: Timothy Lasso, MD;  Location: MAIN OR [facility];  Service: Ortho Spine   ??? PR ARTHRODESIS ANT INTERBODY MIN DISCECTOMY, CERVICAL BELOW C2  03/04/2017    Procedure: Arthrodesis, Anterior Interbody Technique, Include Minimal Diskectomy To Prep Interspace; Cervical Below C2;  Surgeon: Timothy Lasso, MD;  Location: MAIN OR [facility];  Service: Ortho Spine   ??? PR ARTHRODESIS ANT INTERBODY MIN DISCECTOMY,EA  ADDL  03/04/2017    Procedure: Arthrodesis, Anterior Interbody Technique, W/Minimal Diskectomy To Prep Interspace; Each Add`L Interspace;  Surgeon: Timothy Lasso, MD;  Location: MAIN OR [facility];  Service: Ortho Spine   ??? PR AUTOGRAFT SPINE SURGERY LOCAL FROM SAME INCISION Midline 03/04/2017    Procedure: Autograft/Spine Surg Only (W/Harvest Graft); Local (Eg, Rib/Spinous Proc, Ples Specter) Obtain From Same Incis;  Surgeon: Timothy Lasso, MD;  Location: MA[facility];  Service: Ortho Spine   ??? PR INSJ BIOMCHN DEV INTERVERTEBRAL DSC SPC W/ARTHRD Midline 03/04/2017    Procedure: Insert Interbody Biomechanical Device(S) With Integral Anterior Instrument For Device Anchoring, When Performed, To Intervertebral Disc Space In Conjunction With Interbody Arthrodesis, Each Interspace;  Surgeon: Timothy Lasso, MD;  Location: MAIN OR [facility];  Service: Ortho Spine   ??? PR INSJ BIOMCHN DEV VRT CORPECTOMY DEFECT W/ARTHRD Midline 03/04/2017    Procedure: Insert Intervertebral Biomechanical Device(S) W Integral Anterior Instrument For Anchoring, When Performed, To Vert Corpectomy(Ies) Defect, In Conjunction W Interbody Arthrodesis, Each Contig Defect;  Surgeon: Timothy Lasso, MD;  [facility]: [facility];  Service: Ortho Spine   ??? PR REMV VERT BODY,CERV,ONE SGMT Midline 03/04/2017    Procedure: Vertebral Corpectomy-Ant W/Decomp; Cerv 1 Segmt;  Surgeon: Timothy Lasso, MD;  Location: MAIN OR [facility];  Service: Ortho Spine   ??? TONSILLECTOMY  1964   ??? TOTAL ABDOMINAL HYSTERECTOMY W/ BILATERAL SALPINGOOPHORECTOMY  2000       Other History:  The social history and family history were personally reviewed and updated in the patient's electronic medical record.    Family History   Problem Relation Age of Onset   ??? Depression Mother    ??? Hypertension Mother    ??? Hyperlipidemia Mother    ??? Stroke Father    ??? Hyperlipidemia Father    ??? Hypertension Father    ??? Hypertension Sister    ??? No Known Problems Brother    ??? No Known Problems Maternal Aunt    ??? No Known Problems Maternal Uncle    ??? No Known Problems Paternal Aunt    ??? Alcohol abuse Paternal Uncle    ??? Stroke Maternal Grandmother    ??? Hyperlipidemia Maternal Grandmother    ??? Hypertension Maternal Grandmother    ??? Cancer Maternal Grandfather    ??? Hyperlipidemia Maternal Grandfather    ??? Hypertension Maternal Grandfather    ??? Hyperlipidemia Paternal Grandmother    ??? Hyperlipidemia Paternal Grandfather    ??? Amblyopia Neg Hx    ??? Blindness Neg Hx    ??? Cataracts Neg Hx    ??? Diabetes Neg Hx    ??? Glaucoma Neg Hx    ??? Macular degeneration Neg Hx    ??? Retinal detachment Neg Hx    ??? Strabismus Neg Hx    ??? Thyroid disease Neg Hx      Social History     Socioeconomic History   ??? Marital status: Divorced     Spouse name: Not on file   ??? Number of children: Not on file   ??? Years of education: Not on file   ??? Highest education level: Not on file   Occupational History   ??? Not on file   Social Needs   ??? Financial resource strain: Not on file   ??? Food insecurity:     Worry: Not on file     Inability: Not on file   ??? Transportation needs:     Medical: Not on  file     Non-medical: Not on file   Tobacco Use   ??? Smoking status: Former Smoker     Types: Cigarettes     Last attempt to quit: 01/19/2016     Years since quitting: 2.6   ??? Smokeless tobacco: Never Used   Substance and Sexual Activity   ??? Alcohol use: No   ??? Drug use: No   ??? Sexual activity: Not Currently   Lifestyle   ??? Physical activity:     Days per week: Not on file     Minutes per session: Not on file   ??? Stress: Not on file   Relationships   ??? Social connections:     Talks on phone: Not on file     Gets together: Not on file     Attends religious service: Not on file     Active member of club or organization: Not on file     Attends meetings of clubs or organizations: Not on file     Relationship status: Not on file   Other Topics Concern   ??? Not on file   Social History Narrative   ??? Not on file       Home Medications:  Current Outpatient Medications on File Prior to Visit   Medication Sig Dispense Refill   ??? baclofen (LIORESAL) 20 MG tablet TAKE 1 TABLET THREE TIMES DAILY 270 tablet 1   ??? benzonatate (TESSALON) 200 MG capsule Take 1 capsule (200 mg total) by mouth Three (3) times a day as needed for cough. 30 capsule 3   ??? BIOTIN ORAL Take 1 tablet by mouth once daily.     ??? buPROPion (WELLBUTRIN SR) 150 MG 12 hr tablet Take 1 tablet (150 mg total) by mouth Two (2) times a day. 180 tablet 1   ??? calcium carbonate/vitamin D3 (CALCIUM 500 + D ORAL) Take 1 tablet by mouth daily.     ??? carBAMazepine (CARBATROL) 200 MG 12 hr capsule TAKE 2 CAPSULES EVERY MORNING, 1 CAPSULE AT LUNCHTIME, AND 2 CAPSULES IN THE EVENING 450 capsule 2   ??? cholecalciferol, vitamin D3, 2,000 unit cap Take 4,000 Units by mouth daily.      ??? clobetasol (TEMOVATE) 0.05 % cream Apply 1 application topically Two (2) times a day.     ??? clonazePAM (KLONOPIN) 0.5 MG tablet Take 1 mg in the morning, 0.5 mg at lunchtime and 1 mg at bedtime. 150 tablet 2   ??? dimethyl fumarate 240 mg CpDR TAKE 1 CAPSULE BY MOUTH TWICE DAILY 60 capsule 5   ??? DULoxetine (CYMBALTA) 20 MG capsule Take 1 capsule (20 mg total) by mouth daily. 90 capsule 1   ??? gabapentin (NEURONTIN) 300 MG capsule Take 1 capsule (300 mg total) by mouth Three (3) times a day. 270 capsule 1   ??? ipratropium (ATROVENT HFA) 17 mcg/actuation inhaler Inhale 2 puffs every six (6) hours as needed for wheezing or shortness of breath. 12.9 g 1   ??? Lactobacillus acidophilus (PROBIOTIC ORAL) Take 1 tablet by mouth daily.     ??? losartan (COZAAR) 25 MG tablet Take 1 tablet (25 mg total) by mouth daily. 90 tablet 3   ??? meloxicam (MOBIC) 15 MG tablet Take 1 tablet (15 mg total) by mouth daily. 90 tablet 1   ??? multivitamin (TAB-A-VITE/THERAGRAN) per tablet Take 1 tablet by mouth daily.     ??? NARCAN 4 mg/actuation nasal spray CALL 911. ADMINISTER A SINGLE SPRAY OF NARCAN IN ONE  NOSTRIL. REPEAT EVERY 3 MINUTES AS NEEDED IF NO OR MINIMAL RESPONSE.  0   ??? omeprazole (PRILOSEC) 20 MG capsule Take 1 capsule (20 mg total) by mouth daily. 90 capsule 3   ??? polyethylene glycol (GLYCOLAX) 17 gram/dose powder Take 17 g by mouth every other day.      ??? [DISCONTINUED] glatiramer 40 mg/mL Syrg Inject 1 mL (40 mg total) under the skin Three (3) times a week. 12 Syringe 2     No current facility-administered medications on file prior to visit.        Allergies:  Allergies as of 09/04/2018 - Reviewed 08/30/2018   Allergen Reaction Noted   ??? Amantadine Nausea Only 12/27/2012   ??? Codeine Itching 12/27/2012       Review of Systems:  A comprehensive review of systems was performed and negative except as indicated in the above history of present illness.    PHYSICAL EXAM:   There were no vitals taken for this visit.  General Appearance:    appearing comfortable, non-toxic, in no distress, and stated age.   Eyes:  PERRL, conjunctiva clear, EOM's intact. As the visit progressed had some lid lagging. No drainage.   Ears:  Normal TM's and external ear canals bilaterally   Nose: Nares normal, septum midline. Normal mucosa.  No drainage or polyps.  No sinus tenderness.   Oropharynx: Moist mucus membranes without lesions or thrush.  Good dentition.  Posterior pharynx clear.   Respiratory:   Clear breath sounds b/l.  No crackles, wheezes, or rhonchi.  Easy work of breathing without accessory muscle use.     Cardiovascular:  Regular rate and rhythm. S1 and S2 normal. No murmur, rub  or gallop.  No JVD.  Pulses intact and symmetric.   No peripheral cyanosis or edema.   Gastrointestinal:   Abdomen soft, non-tender, and non-distended.  Normal bowel sounds.  No masses or organomegaly.   Musculoskeletal: No tenderness or deformity of the chest wall.  Joints normal.   No clubbing.   Skin: No rashes, lesions, skin changes.   Heme/Lymph: No cervical, supraclavicular, submandibular, or suprasternal adenopathy.  No bruising or petechiae.   Neurologic: Alert and oriented x3. No focal neurological deficits.  Normal gait.       LABORATORY and RADIOLOGY DATA:     Pulmonary Function Tests:  Pulmonary function testing personally reviewed and interpreted by myself. Discussed with the attending.   No obstruction. Consistent with moderate restriction. Normal DLCO.   MIP and MEP are very severely reduced.       Pertinent Laboratory Data:  Labs from 04/2018  Normal CO2  Normal eos  Venous blood gas 7.36/54    Pertinent Imaging Data:  Images personally reviewed and discussed with the attending.   04/26/2018  FINDINGS:  Bilateral anterior chest surgical clips. Cervical fusion hardware partially visualized.  Biapical pleural-parenchymal scarring. Otherwise clear lungs.  No pleural effusion or pneumothorax.  Unremarkable cardiomediastinal silhouette.   ??  IMPRESSION:  No acute airspace disease.      ASSESSMENT and PLAN     LASHELL MOFFITT is a 68 y.o. female with MS who presents with dyspnea. Most likely related to MS and neuromuscular weakness.         The patient was seen with Dr. Roselyn Bering and will return to clinic in 3 months.    [id number] Edan Sondra Barges, MD    Henrene Dodge, MD  Pulmonary & Critical Care Fellow  September 04, 2018

## 2018-09-05 NOTE — Unmapped (Signed)
Patient had sent a message asking for her appointment with Dr. Johnnye Lana moved up. I called the patient this morning and was told that she was able to get the appointment moved up to 20/05/2019.

## 2018-09-10 NOTE — Unmapped (Addendum)
[facility] Specialty Pharmacy Refill Coordination Note    Specialty Medication(s) to be Shipped:   Neurology: Tecfidera    Other medication(s) to be shipped: N/A     Ashlee Christian, DOB: 23-Sep-1950  Phone: [phone] (home)       All above HIPAA information was verified with patient.     Completed refill call assessment today to schedule patient's medication shipment from the [facility] [phone]).       Specialty medication(s) and dose(s) confirmed: Regimen is correct and unchanged.   Changes to medications: Ashlee Christian reports no changes reported at this time.  Changes to insurance: No  Questions for the pharmacist: No    The patient will receive a drug information handout for each medication shipped and additional FDA Medication Guides as required.      DISEASE/MEDICATION-SPECIFIC INFORMATION        N/A    ADHERENCE              MEDICARE PART B DOCUMENTATION     Tecfidera 240mg : Patient has 2 weeks of medication on hand.    SHIPPING     Shipping address confirmed in Epic.     Delivery Scheduled: Yes, Expected medication delivery date: 09/17/18.  However, Rx request for refills was sent to the provider as there are none remaining.     Medication will be delivered via Next Day Courier to the home address in [employer] [place].    Jasper Loser   [facility] Specialty Technician

## 2018-09-11 MED ORDER — DIMETHYL FUMARATE 240 MG CAPSULE,DELAYED RELEASE
ORAL_CAPSULE | ORAL | 5 refills | 0 days | Status: CP
Start: 2018-09-11 — End: 2018-09-30
  Filled 2018-09-16: qty 60, 30d supply, fill #0

## 2018-09-16 MED FILL — TECFIDERA 240 MG CAPSULE,DELAYED RELEASE: 30 days supply | Qty: 60 | Fill #0 | Status: AC

## 2018-09-19 NOTE — Unmapped (Signed)
Specialty Pharmacy - Neurology Medication Clinical Assessment     Ashlee Christian is a 68 y.o. female contacted today regarding her specialty medication(s) dimethyl fumarate (TECFIDERA)     Today, Ashlee Christian reports her [place] Raid is going well and denies any recent MS flares. However, she reports having a couple respiratory infections over the past 2 months, both requiring oral antibiotics. In November 2019, she was prescribed a 7-day supply of doxycycline for what she believed to be bronchitis or pneumonia. Mostly recently, she received a 10-day course of Augmentin for another respiratory infection. She has an appointment with Dr. Johnnye Lana scheduled on 09/30/18.    Verified patient's date of birth / HIPAA.    Medications reviewed and verified with patient: Allergies - Medications -      Specialty medication(s) and dose(s) confirmed: yes  Changes to medications: no  Changes to insurance: no     Is therapy still appropriate given the disease, patient response, and medical condition? yes  Is therapy still effective? yes  Medication Adherence    Patient reported X missed doses in the last month:  2  Specialty Medication:  Tecfidera 240 mg  Patient is on additional specialty medications:  No  Patient is on more than two specialty medications:  No  Any gaps in refill history greater than 2 weeks in the last 3 months:  no  Demonstrates understanding of importance of adherence:  yes  Informant:  patient  Reliability of informant:  reliable  Provider-estimated medication adherence level:  good  Patient is at risk for Non-Adherence:  No  Reasons for non-adherence:  no problems identified  Adherence tools used:  patient uses a pill box to manage medications  Confirmed plan for next specialty medication refill:  delivery by pharmacy         Adverse Effects    Infection:  Pos       Drug Interactions    Drug interactions evaluated:  yes  Clinically relevant drug interactions identified:  no       Patient Counseling Counseled the patient on the following:  doses and administration discussed, possible adverse effects and management discussed, lab monitoring and follow-up discussed, cost of medications and cost implications discussed, adherence and missed doses discussed       Instructed patient to keep her appointment with Dr. Johnnye Lana, scheduled for 09/30/18. Informed patient she may call the [facility] Neurology Clinical Pharmacist line at [phone] if she continues to experience signs of infection or has any questions regarding her Tecfidera.    The patient will receive an [employer] print out for each medication shipped and additional FDA Medication Guides as required. Patient education from [place] or [name] may also be included in the shipment.     Barbette Hair, PharmD, MPH, BCPS  PGY2 Ambulatory Care Pharmacy Resident    Worthy Flank, PharmD, CPP  Clinical Pharmacist, [facility]  Phone: [phone]

## 2018-09-20 MED ORDER — GABAPENTIN 300 MG CAPSULE
ORAL_CAPSULE | Freq: Three times a day (TID) | ORAL | 1 refills | 0 days | Status: CP
Start: 2018-09-20 — End: 2018-12-25

## 2018-09-20 MED ORDER — BACLOFEN 20 MG TABLET
ORAL_TABLET | 1 refills | 0 days | Status: CP
Start: 2018-09-20 — End: 2018-12-17

## 2018-09-21 NOTE — Unmapped (Signed)
Patient left a message on voicemail. I called her back and she reports she is half through her second round of antibiotics for an ear infection and she feels no better. In fact she feels achy and is having a hard time getting out of bed.    Patient wants to know if Tecfidira can affect her ability to fight infections and if that is why she feels so bad.    She would like a call from Dr. Johnnye Lana or Ms. Elvera Bicker.    Please cal her at [phone]

## 2018-09-27 NOTE — Unmapped (Signed)
Error

## 2018-09-30 ENCOUNTER — Ambulatory Visit: Admit: 2018-09-30 | Discharge: 2018-10-01 | Payer: MEDICARE | Attending: Neurology | Primary: Neurology

## 2018-09-30 DIAGNOSIS — G4700 Insomnia, unspecified: Secondary | ICD-10-CM

## 2018-09-30 DIAGNOSIS — R0789 Other chest pain: Secondary | ICD-10-CM

## 2018-09-30 DIAGNOSIS — R5382 Chronic fatigue, unspecified: Secondary | ICD-10-CM

## 2018-09-30 DIAGNOSIS — M792 Neuralgia and neuritis, unspecified: Secondary | ICD-10-CM

## 2018-09-30 DIAGNOSIS — F419 Anxiety disorder, unspecified: Secondary | ICD-10-CM

## 2018-09-30 DIAGNOSIS — R4189 Other symptoms and signs involving cognitive functions and awareness: Secondary | ICD-10-CM

## 2018-09-30 DIAGNOSIS — G35 Multiple sclerosis: Principal | ICD-10-CM

## 2018-09-30 MED ORDER — PLEGRIDY 63 MCG/0.5 ML-94 MCG/0.5 ML SUBCUTANEOUS PEN INJECTOR
SUBCUTANEOUS | 0 refills | 0.00000 days | Status: CP
Start: 2018-09-30 — End: 2018-11-20

## 2018-09-30 MED ORDER — PLEGRIDY 125 MCG/0.5 ML SUBCUTANEOUS PEN INJECTOR
SUBCUTANEOUS | 4 refills | 0 days | Status: CP
Start: 2018-09-30 — End: 2018-12-25
  Filled 2018-10-03: qty 1, 28d supply, fill #0

## 2018-09-30 MED ORDER — DALFAMPRIDINE ER 10 MG TABLET,EXTENDED RELEASE,12 HR
ORAL_TABLET | Freq: Two times a day (BID) | ORAL | 0 refills | 0 days | Status: CP
Start: 2018-09-30 — End: 2018-10-02

## 2018-09-30 MED ORDER — CARBAMAZEPINE ER 200 MG CAPSULE,EXTENDED RELEASE MPHASE12HR
ORAL_CAPSULE | 2 refills | 0 days | Status: CP
Start: 2018-09-30 — End: 2019-04-14

## 2018-09-30 MED ORDER — CLONAZEPAM 0.5 MG TABLET
ORAL_TABLET | 2 refills | 0 days | Status: CP
Start: 2018-09-30 — End: 2019-01-27

## 2018-09-30 NOTE — Unmapped (Signed)
Pt fell down 09/29/2018 in the grass on her face, her ribs hurt on the left side, she did not go to ER, there was no bleeding, no signs of concussion.

## 2018-10-01 ENCOUNTER — Ambulatory Visit: Admit: 2018-10-01 | Discharge: 2018-10-02 | Payer: MEDICARE

## 2018-10-01 DIAGNOSIS — R0789 Other chest pain: Principal | ICD-10-CM

## 2018-10-01 NOTE — Unmapped (Addendum)
?     In case of:  ? a suspected relapse (new symptoms or worsening existing symptoms, lasting for >24h)  OR  ? a need for an additional appointment for other reasons  OR   ? if you have other questions     Please contact:    [facility] Neurology [facility] Desk  Phone: [phone]     ? If you have questions for our  pharmacist, please call:      Worthy Flank, PharmD, CPP  Phone: [phone]             It was a pleasure to see you today!    Sarita Bottom, MD  Clinical Associate Professor of Neurology  [facility] of Medicine, Department of Neurology  Multiple Sclerosis/Neuroimmunology Division  [address] [place], [place] [address]  Phone: [phone]   Fax: [phone]    ......................................................................................................................  Our recommendations from today's visit:  1) Decrease carbamazepine to 200 mg in the morning, 200 mg lunchtime and 400 mg in the evening, for a week, then 200 mg three times a day  2) We Rx dalfampridine, take 10 mg twice a day.   3) We are switching to Plegridy, please find info below.   4) plase perform XR of the chest tomorrow at [facility]  and the labs  5) Continue with other medications as prescribed  6) Follow-up with Yolande Jolly in 6 months

## 2018-10-01 NOTE — Unmapped (Signed)
Per test claims for [employer] and Maintenance RXs at the [facility], patient needs Medication Assistance Program for Prior Authorization.

## 2018-10-01 NOTE — Unmapped (Signed)
[facility] Specialty Medication Referral: Financial Assistance Approved      Medication (Brand/Generic): Plegridy &    Final Test Claim completed with resulted information below:    Patient ABLE to fill at [facility]  Insurance Company:  [facility]  Anticipated Copay: $0   Is anticipated copay with a copay card or grant? Yes    Does patient's insurance plan only allow a 15 day supply for the first 6 fills in the Split Fill Program? No  If yes, inform patient they can request to dis-enroll from the [facility] by calling the patient help desk at N/A.      If the copay is under the $25 defined limit, per policy there will be no further investigation of need for financial assistance at this time unless patient requests. This referral has been communicated to the provider and handed off to the [facility] team for further processing and filling of prescribed medication.   ______________________________________________________________________  Please utilize this referral for viewing purposes as it will serve as the central location for all relevant documentation and updates.

## 2018-10-01 NOTE — Unmapped (Signed)
[employer] [facility] [employer] [facility]  Multiple Sclerosis/Neuroimmunology Division  Sarita Bottom, MD  Associate Professor of Neurology    DATE OF VISIT: 09/30/2018  Re:  Ashlee Christian  [address]  [place] [address]  MRN: [id number]  DOB: 11-30-1950      Direct entry by: Dr. Sarita Bottom    Visit: Follow-up      REASON FOR VISIT: Ashlee Christian, a 68 y.o. Caucasian right handed female, is seen in consultation at the [facility], Multiple Sclerosis/Neuroimmunology Division for the follow-up on multiple sclerosis.Ashlee Christian was last time seen at the office visit with me on 04/23/2018.  She comes with her son and agrees that he is present.     Assessment:     1. I took a detailed interval history from Ashlee Christian.  2. I personally reviewed the patient's interval medical records.    3. I performed neurological examination and EDSS  4. Ms. RIVKA BAUNE agreed with the recommended diagnostic and treatment plan.                                                                                                                                             ?? Multiple sclerosis:  Ashlee Christian was diagnosed with multiple sclerosis many years ago. The disease evolution is since her age of 3,  the disease follows a slowly secondary-progressive course. She was treated with Copaxone and switched to Tecfidera on 09/04/2017 due to disease activity. Please review my initial clinic note on 10/13/2016 and subsequent clinic notes for details on the HPI.   Pulmonologist thinks her breathing problems are neurologic. She had two infections (RI and ear infection) over the last 1.5 months, She also has leukopenia and lymphopenia likely due to Tecfidera. I recommended to stop Tecfidera and the patient stopped it. We discussed stopping DMD given her age vs switching to Plegridy and the patient decided to start with Plegridy. Printed handouts provided. Please see the detailed plan below.   .      ?? Other co-morbidities:  To be followed by PCP. Follow-up with the psychiatry for the management of anxiety and depression.     Plan:     1) Decrease carbamazepine to 200 mg in the morning, 200 mg lunchtime and 400 mg in the evening, for a week, then 200 mg three times a day: decreased due to hyponatremia, the drug is given as symptomatic treatment for  MS hugs.   2) We Rx dalfampridine, take 10 mg twice a day: to reduce respiratory muscles fatigability.   3) Rx Plegridy, provide handouts. Refilled Klonopin in the same dose: 1 mg+0.5mg +1 mg.Klonopin helps with anxiety, anxiety makes MS hugs worse.   4)  Ordered XR of the chest tomorrow given recent fall (at  [facility])  and CBC/diff, CMP, and TSH (out lab was closed today, the patient can perform the labs in [facility]).   5) Continue with other medications as prescribed  6) Follow-up with Clara Zelasky in 6 months, earlier if needed.       Subjective:     CHIEF COMPLAINTS: walking difficulty, ear pain, anxiety, depression, MS hugs, fatigue, cognitive problems.      HISTORY OF PRESENT ILLNESS:  Ashlee Christian was diagnosed with MS. Her first symptoms started at her age of 58 with right leg weakness and left sided facial sensory disturbances (hypersensitivity).   Please review my initial clinic note on 10/13/2016 and subsequent clinic notes  for details on the HPI.    MS DMD history:  She received Copaxone 2009 until 07/2009 and then discontinued therapy due to disease stability. She restarted Copaxone 05/2015 and discontinued 01/20/2016 due to anxiety.  In 09/2016 Ashlee Christian voiced that she would like to continue with Copaxone, but due to  financial issues, at that time she was not able to re-start the treatment, so she agreed to start with Rebif 22mg  three times a week. However, after the grant options  for Copaxone coverage were re-opened and Ashlee Christian started with  Copaxone in 10/2016. She was switched to Tecfidera on  09/04/2017 due to disease activity. Discontinued Tecfidera in the first week of Feb/2020 after having recurrent infections.     INTERVAL HISTORY: Since her last visit with me on 04/23/2018 she had no MS relapses. She had  2 infections over the last  1.5 months- lung infection close to pneumonia and ear infection on the left started, di not have fever,  She was treated with antibiotic. Stopped tecfidera one week ago because of recurrent infections.  Still feels tired, still has ear pain. Has breathing problem. Little energy. Still has MS hugs, breathing exercises, meditation. Still has MS hugs Takes carbamazepine 400mg +200mg +400mg  for MS hugs, and Klonopin that helps. Take Klonopin 1mg +0.5mg +1mg .   Ms hughs, not enough breathing, sometimes sleeps well, sometimes wakes up. Feels pressure in the chest.   She fell yesterday evening because of slipping, did not pass out, did not go to ED. She has pain now on the left side of the chest. Denies  Head trauma and nosebleeds. She is stressed because of cogntive issues and does not socialize.     ............................................................................................................................................Ashlee Christian  DIAGNOSTIC STUDIES / REVIEW OF RECORDS:    Prior medical records: [facility], [facility], [place] [facility] the [place]    MRI:  10/16/07: Brain MRI with and without contrast: REPORT: Multiple bilateral subcortical and deep white matter areas of increased T2 and FLAIR signal.   05/27/08:Brain MRI with and without contrast: REPORT: Stable number and distribution of the bilateral white matter signal abnormalities. ??No new lesions or enhancing lesions Identified.  07/28/09: Brain MRI with and without contrast: REPORT: Stable number and distribution of the bilateral white matter signal abnormalities. ??No new lesions or enhancing lesions Identified.  01/16/12: Brain MRI  with and without contrast: REPORT: No new findings. ??Scattered deep white matter FLAIR  hyperintensities are unchanged in size and distribution. ??No enhancement to suggest active demyelination.   09/17/2013: Brain MRI  with and without contrast: REPORT: Unchanged, scattered deep FLAIR hyperintense white matter lesions. No new lesions. No abnormal enhancement. Normal optic nerves.  06/18/15: Brain MRI  with and without contrast: REPORT: Multiple white matter lesions which are unchanged in size and distribution compared to study  dated 09/16/13.No new enhancing lesions.  06/13/2016: Brain MRI  with and without contrast: REPORT: No significant change in the number or distribution of the scattered white matter T2 hyperintense lesions. No enhancing lesions.    07/26/2016:Cervical spine MRI with and without contrast: REPORT: There is grade 1 retrolisthesis of C4 on C5. There is reversal of the normal cervical lordosis. No convincing foci of signal abnormality are noted within the cervical cord. There is a nonenhancing T2 bright/T1 dark focus in the C3 vertebral body, may represent an atypical hemangioma. There is no abnormal enhancement within the cervical spine. C2-C3: No significant spinal canal or neural foraminal narrowing. The lateral facet hypertrophy.C3-C4: No significant spinal canal or neural foraminal narrowing. Bilateral facet hypertrophy. C4-C5: Ligamentum flavum hypertrophy. There is a disc bulge with moderate spinal canal and to severe bilateral neural foraminal narrowing, secondary to disc osteophyte complex.C5-C6: There is a disc bulge with moderate spinal canal and mild bilateral neural foraminal narrowing.C6-C7: There is a disc bulge with mild spinal canal narrowing, secondary to disc osteophyte complex. Mild significant neural foraminal narrowing.C7-T1: No significant spinal canal or neural foraminal narrowing.  05/08/2017: Cervical spine MRI without contrast: REPORT (COMPARISON: CT spine 04/25/2017 and MRI 03/03/2017):Sequelae of prior ACDF extending from C4-C7. No fractures or perihardware abnormality.  -Multiple levels with severe neural foraminal stenosis worst at C4-5. This is unchanged from MRI 03/03/2017. - No spinal canal stenosis.- No cord signal abnormality.  06/11/2017: C/T/L spine MRI without contrast, REPORT (COMPARISON: Cervical spine radiographs dated 06/05/2017, MRI cervical spine dated 05/08/2017, MRI thoracic spine dated 07/26/2016):Stable thoracic cord lesion at T10-T11. No new lesions in the cervical or thoracic spine. - Status post C4-C7 ACDF with unchanged mild canal narrowing at C3-C4 and C5-C6. Severe neural foraminal narrowing is unchanged. - Lumbar spondylosis worst at L5-S1 with central disc extrusion and mild left neural foraminal narrowing. Marked hypertrophic changes of the facets at L4-L5 with a 6 mm left-sided medially projecting synovial cyst which mildly displaces the descending nerve roots.  10/16/07: Thoracic spine MRI with and without contrast: REPORT: Incidental left renal cystic lesion, otherwise normal MRI of the total spine.  07/26/2016:Thoracic spine MRI with and without contrast: REPORT:The vertebral bodies are normally aligned. As a single focus of increased cord signal noted at the T10-11 level on the STIR images. This lesion is also appreciated on the sagittal T2-weighted image but is not well seen on the axial T2-weighted images. No abnormal enhancement is seen in this region. This is not significantly changed compared to the comparison scan from 2009. 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 There is no significant spinal canal or neural foraminal stenosis.     02/05/2018: Brain MRI with and w/o contrast, report (COMPARISON: MRI 06/13/2016): Multiple white matter lesions in a distribution compatible with multiple sclerosis. Several new lesions are identified since 06/13/2016. No abnormal enhancing lesions are identified.  02/05/2018: Cervical MRI with and w/o contrast, report (COMPARISON: Concurrent brain and lumbar spine MRIs, MRI total spine dated 06/11/2017): Stable thoracic cord lesion at T10-T11. No new lesions or abnormal enhancement in the cervical or thoracic spine. C4-C7 ACDF with unchanged mild canal narrowing and severe multilevel bilateral neural foraminal narrowing.  02/05/2018: Thoracic MRI with and w/o contrast, report (COMPARISON: Concurrent brain and lumbar spine MRIs, MRI total spine dated 06/11/2017: Stable thoracic cord lesion at T10-T11. No new lesions or abnormal enhancement in  the cervical or thoracic spine. C4-C7 ACDF with unchanged mild canal narrowing and severe multilevel bilateral neural foraminal narrowing.    Lumbar puncture:  Per notes on 10/27/2009 by Dr. Fatima Sanger Plese had negative CSF studies.     Blood tests:  07/26/16 : serum creatinine: WNL.   02/25/14: Vit D, 1,25-Dihydroxy: 36 (ref. 18 - 78 pg/mL)  10/13/16: vitamin D 25OH: 83.8ng/mL (ref. 20.0 - 80.0)  No visits with results within 1 Month(s) from this visit.   Latest known visit with results is:   Appointment on 08/07/2018   Component Date Value Ref Range Status   ??? Sodium 08/07/2018 128* 135 - 145 mmol/L Final   ??? Potassium 08/07/2018 4.7  3.5 - 5.0 mmol/L Final   ??? Chloride 08/07/2018 90* 98 - 107 mmol/L Final   ??? CO2 08/07/2018 32.0* 22.0 - 30.0 mmol/L Final   ??? BUN 08/07/2018 16  7 - 21 mg/dL Final   ??? Creatinine 08/07/2018 0.53* 0.60 - 1.00 mg/dL Final   ??? BUN/Creatinine Ratio 08/07/2018 30   Final   ??? EGFR CKD-EPI Non-African American,* 08/07/2018 >90  >=60 mL/min/1.10m2 Final   ??? EGFR CKD-EPI African American, Fem* 08/07/2018 >90  >=60 mL/min/1.25m2 Final   ??? Glucose 08/07/2018 88  70 - 179 mg/dL Final   ??? Calcium 13/03/6577 8.8  8.5 - 10.2 mg/dL Final   ??? Albumin 46/96/2952 4.1  3.5 - 5.0 g/dL Final   ??? Total Protein 08/07/2018 6.7 6.5 - 8.3 g/dL Final   ??? Total Bilirubin 08/07/2018 0.3  0.0 - 1.2 mg/dL Final   ??? AST 84/13/2440 21  14 - 38 U/L Final   ??? ALT 08/07/2018 13  <35 U/L Final   ??? Alkaline Phosphatase 08/07/2018 55  38 - 126 U/L Final   ??? Anion Gap 08/07/2018 6* 7 - 15 mmol/L Final   ??? WBC 08/07/2018 3.1* 4.5 - 11.0 10*9/L Final   ??? RBC 08/07/2018 4.06  4.00 - 5.20 10*12/L Final   ??? HGB 08/07/2018 13.3  12.0 - 16.0 g/dL Final   ??? HCT 06/17/2535 39.7  36.0 - 46.0 % Final   ??? MCV 08/07/2018 98.0  80.0 - 100.0 fL Final   ??? MCH 08/07/2018 32.8  26.0 - 34.0 pg Final   ??? MCHC 08/07/2018 33.5  31.0 - 37.0 g/dL Final   ??? RDW 64/40/3474 12.7  12.0 - 15.0 % Final   ??? MPV 08/07/2018 7.3  7.0 - 10.0 fL Final   ??? Platelet 08/07/2018 355  150 - 440 10*9/L Final   ??? Neutrophils % 08/07/2018 63.8  % Final   ??? Lymphocytes % 08/07/2018 21.1  % Final   ??? Monocytes % 08/07/2018 10.0  % Final   ??? Eosinophils % 08/07/2018 3.1  % Final   ??? Basophils % 08/07/2018 0.6  % Final   ??? Absolute Neutrophils 08/07/2018 2.0  2.0 - 7.5 10*9/L Final   ??? Absolute Lymphocytes 08/07/2018 0.7* 1.5 - 5.0 10*9/L Final   ??? Absolute Monocytes 08/07/2018 0.3  0.2 - 0.8 10*9/L Final   ??? Absolute Eosinophils 08/07/2018 0.1  0.0 - 0.4 10*9/L Final   ??? Absolute Basophils 08/07/2018 0.0  0.0 - 0.1 10*9/L Final   ??? Large Unstained Cells 08/07/2018 1  0 - 4 % Final   ??? Macrocytosis 08/07/2018 Slight* Not Present Final         Other:  11/21/2000: Terminal ileum, biopsy: No significant pathologic abnormality. Random colon, biopsy: Prominent melanosis coli.  ............................................................................................................................................Ashlee Christian  Past Medical History:  Past Medical History:   Diagnosis Date   ??? Diverticulitis large intestine 2000    with abscess and perforation with peritonitis   ??? Hypertension 2012   ??? 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 Osteoporosis      Allergies   Allergen Reactions   ??? Amantadine Nausea Only   ??? Codeine Itching     Current Outpatient Medications   Medication Sig Dispense Refill   ??? baclofen (LIORESAL) 20 MG tablet TAKE 1 TABLET THREE TIMES DAILY 270 tablet 1   ??? BIOTIN ORAL Take 1 tablet by mouth once daily.     ??? buPROPion (WELLBUTRIN SR) 150 MG 12 hr tablet Take 1 tablet (150 mg total) by mouth Two (2) times a day. 180 tablet 1   ??? calcium carbonate/vitamin D3 (CALCIUM 500 + D ORAL) Take 1 tablet by mouth daily.     ??? carBAMazepine (CARBATROL) 200 MG 12 hr capsule Decrease carbamazepine to 200 mg in the morning, 200 mg lunchtime and 400 mg in the evening, for a week, then 200 mg three times a day. 90 capsule 2   ??? cholecalciferol, vitamin D3, 2,000 unit cap Take 4,000 Units by mouth daily.      ??? clobetasol (TEMOVATE) 0.05 % cream Apply 1 application topically Two (2) times a day.     ??? clonazePAM (KLONOPIN) 0.5 MG tablet Take 1 mg in the morning, 0.5 mg at lunchtime and 1 mg at bedtime. 150 tablet 2   ??? dalfampridine 10 mg Tb12 Take 1 tablet (10 mg total) by mouth two (2) times a day. 60 tablet 0   ??? DULoxetine (CYMBALTA) 20 MG capsule Take 1 capsule (20 mg total) by mouth daily. 90 capsule 1   ??? gabapentin (NEURONTIN) 300 MG capsule TAKE 1 CAPSULE (300 MG TOTAL) BY MOUTH THREE (3) TIMES A DAY. 270 capsule 1   ??? Lactobacillus acidophilus (PROBIOTIC ORAL) Take 1 tablet by mouth daily.     ??? losartan (COZAAR) 25 MG tablet Take 1 tablet (25 mg total) by mouth daily. 90 tablet 3   ??? meloxicam (MOBIC) 15 MG tablet Take 1 tablet (15 mg total) by mouth daily. 90 tablet 1   ??? multivitamin (TAB-A-VITE/THERAGRAN) per tablet Take 1 tablet by mouth daily.     ??? NARCAN 4 mg/actuation nasal spray CALL 911. ADMINISTER A SINGLE SPRAY OF NARCAN IN ONE NOSTRIL. REPEAT EVERY 3 MINUTES AS NEEDED IF NO OR MINIMAL RESPONSE.  0   ??? omeprazole (PRILOSEC) 20 MG capsule Take 1 capsule (20 mg total) by mouth daily. 90 capsule 3   ??? peginterferon beta-1a (PLEGRIDY) 125 mcg/0.5 mL PnIj Inject 125 mcg under the skin every fourteen (14) days. Strating from Day 29 following titration. 2 Syringe 4   ??? peginterferon beta-1a (PLEGRIDY) 63 mcg/0.5 mL- 94 mcg/0.5 mL PnIj Inject 1 Syringe under the skin every fourteen (14) days. Day 1: Inject subcutaneously.Day 15: Inject Subcutaneously. 2 Syringe 0   ??? polyethylene glycol (GLYCOLAX) 17 gram/dose powder Take 17 g by mouth every other day.        No current facility-administered medications for this visit.          Past Surgical History:   Procedure Laterality Date   ??? CATARACT EXTRACTION Bilateral 2016    [place]   ??? COLECTOMY  2001    Partial   ??? EYE SURGERY Left 2012    Laser sx due to  detached retina    ??? HYSTERECTOMY     ??? PR ALLOGRAFT FOR SPINE SURGERY ONLY MORSELIZED Midline 03/04/2017    Procedure: Allograft For Spine Surgery Only; Morselized;  Surgeon: Timothy Lasso, MD;  Location: MAIN OR [facility];  Service: Ortho Spine   ??? PR ANTERIOR INSTRUMENTATION 4-7 VERTEBRAL SEGMENTS Midline 03/04/2017    Procedure: Anterior Instrumentation; 4 To 7 Vertebral Segments;  Surgeon: Timothy Lasso, MD;  Location: MAIN OR [facility];  Service: Ortho Spine   ??? PR ARTHRODESIS ANT INTERBODY INC DISCECTOMY, CERVICAL BELOW C2 Midline 03/04/2017    Procedure: Arthrodes, Ant Intrbdy, Incl Disc Spc Prep, Discect, Osteophyt/Decompress Spinl Crd &/Or Nrv Rt, Crv Blo C2;  Surgeon: Timothy Lasso, MD;  Location: MAIN OR [facility];  Service: Ortho Spine   ??? PR ARTHRODESIS ANT INTERBODY MIN DISCECTOMY, CERVICAL BELOW C2  03/04/2017    Procedure: Arthrodesis, Anterior Interbody Technique, Include Minimal Diskectomy To Prep Interspace; Cervical Below C2;  Surgeon: Timothy Lasso, MD;  Location: MAIN OR [facility];  Service: Ortho Spine   ??? PR ARTHRODESIS ANT INTERBODY MIN DISCECTOMY,EA ADDL  03/04/2017    Procedure: Arthrodesis, Anterior Interbody Technique, W/Minimal Diskectomy To Prep Interspace; Each Add`L Interspace;  Surgeon: Timothy Lasso, MD;  Location: MAIN OR [facility];  Service: Ortho Spine   ??? PR AUTOGRAFT SPINE SURGERY LOCAL FROM SAME INCISION Midline 03/04/2017    Procedure: Autograft/Spine Surg Only (W/Harvest Graft); Local (Eg, Rib/Spinous Proc, Ples Specter) Obtain From Same Incis;  Surgeon: Timothy Lasso, MD;  Location: MAIN OR [facility];  Service: Ortho Spine   ??? PR INSJ BIOMCHN DEV INTERVERTEBRAL DSC SPC W/ARTHRD Midline 03/04/2017    Procedure: Insert Interbody Biomechanical Device(S) With Integral Anterior Instrument For Device Anchoring, When Performed, To Intervertebral Disc Space In Conjunction With Interbody Arthrodesis, Each Interspace;  Surgeon: Timothy Lasso, MD;  Location: MAIN OR [facility];  Service: Ortho Spine   ??? PR INSJ BIOMCHN DEV VRT CORPECTOMY DEFECT W/ARTHRD Midline 03/04/2017    Procedure: Insert Intervertebral Biomechanical Device(S) W Integral Anterior Instrument For Anchoring, When Performed, To Vert Corpectomy(Ies) Defect, In Conjunction W Interbody Arthrodesis, Each Contig Defect;  Surgeon: Timothy Lasso, MD;  Location: MAIN OR [facility];  Service: Ortho Spine   ??? PR REMV VERT BODY,CERV,ONE SGMT Midline 03/04/2017    Procedure: Vertebral Corpectomy-Ant W/Decomp; Cerv 1 Segmt;  Surgeon: Timothy Lasso, MD;  Location: MAIN OR [facility];  Service: Ortho Spine   ??? TONSILLECTOMY  1964   ??? TOTAL ABDOMINAL HYSTERECTOMY W/ BILATERAL SALPINGOOPHORECTOMY  2000           Social History:       Social History   ??? Marital status: separated     Spouse name: N/A   ??? Number of children: N/A   ??? Years of education: N/A     Social History Main Topics   ??? Smoking status: Never Smoker   ??? Smokeless tobacco: Never Used   ??? Alcohol use No   ??? Drug use: No   ??? Sexual activity: Not Asked     Other Topics Concern   ??? None     Social History Narrative    Lives with her son.       Family History:    Family History   Problem Relation Age of Onset   ??? Depression Mother    ??? Hypertension Mother    ??? Hyperlipidemia Mother    ??? Stroke Father    ??? Hyperlipidemia Father    ??? Hypertension  Father    ??? Hypertension Sister    ??? No Known Problems Brother    ??? No Known Problems Maternal Aunt    ??? No Known Problems Maternal Uncle    ??? No Known Problems Paternal Aunt    ??? Alcohol abuse Paternal Uncle    ??? Stroke Maternal Grandmother    ??? Hyperlipidemia Maternal Grandmother    ??? Hypertension Maternal Grandmother    ??? Cancer Maternal Grandfather    ??? Hyperlipidemia Maternal Grandfather    ??? Hypertension Maternal Grandfather    ??? Hyperlipidemia Paternal Grandmother    ??? Hyperlipidemia Paternal Grandfather    ??? Amblyopia Neg Hx    ??? Blindness Neg Hx    ??? Cataracts Neg Hx    ??? Diabetes Neg Hx    ??? Glaucoma Neg Hx    ??? Macular degeneration Neg Hx    ??? Retinal detachment Neg Hx    ??? Strabismus Neg Hx    ??? Thyroid disease Neg Hx         Review of Systems:  A 10-systems review was performed and, unless otherwise noted, declared negative by patient.    Objective:     Physical Exam:  BP 131/64 (BP Site: R Arm, BP Position: Sitting, BP Cuff Size: Medium)  - Pulse 65  - Resp 19  - Ht 167.9 cm (5' 6.1)  - Wt 61.3 kg (135 lb 3.2 oz)  - BMI 21.76 kg/m??     General Appearance: in no acute distress. Normal skin color, afebrile.  Eupneic, normal respiratory rate. Abdomen: Soft, non-tender. No peripheral  edema, peripheral pulses palpable.     NEUROLOGICAL EXAMINATION:     General:  Alert and oriented to person, place, time and situation.    Mild dysarthria, bradylalia, no aphasia. Naming/fluency/repetition intact.    Cranial Nerves:     II, III- Pupils are equal and a bit sluggishly reactive to light b/l. R 20/200, L 20/50 with glasses.   III, IV, VI- extra ocular movements are intact, No ptosis, denies diplopia on standard examination distance, no nystagmus.  V- normal facial sensation today  VII- WNL  VIII- Hearing grossly intact to conversation.   IX and X- symmetric palate contraction, mild dysarthria, bradylalia, reports mild dysphagia.  XI- Full shoulder shrug bilaterally; no wasting, normal tone and strength of sternocleidomastoid muscles bilaterally.  XII- No tongue atrophy, no tongue fasciculations; tongue protrudes midline, full range of movements of the tongue.    Neck flexion normal, reduced neck range of motion on  rotation b/l. Paraspinal cervical muscle spasm.     Motor Exam:     Mild distal hand interosseal muscle atrophy. Fasciculations not observed.     Muscle strength:    Muscles UEs  LEs    R L  R L   Deltoids 4+/5 4+/5 Hip flexors  4/5 4/5   Biceps 5/5 5/5 Hip extensors 4/5 4/5   Triceps 4+/5 4+/5 Knee flexors 4/5 4/5   Hand grip 5/5 5/5 Knee extensors 4/5 4/5   Wrist flexors 5/5 5//5 Foot dorsal flexors 4/5 4/5   Wrist extensors 5/5 4/5 Foot plantar flexors 4/5 4/5   Finger flexors 4/5 4/5      Finger extensors 5-/5 5-/5          Reflexes R L   Biceps +2 +2   Brachioradialis  +2 +2   Triceps +2 +2   Patella +1 +1   Achilles +1 +1     Normal tone  b/l. Negative Babinski sign bilaterally.    Sensory system:  ? Superficial light touch sensation: no facial sensory deficit on today's exam. No sensory level.   ? Vibration sense: decreased in all  limbs  ? Position sense: WNL.   Pinprick test for pain sensation: WNL.  (WNL= within normal limits; UE= upper extremities; LE= lower extremities; R= right, L= left).    Cerebellar/Coordination:  No UE ataxia or intention tremor, mild b/l LE ataxia, Mild  gait ataxia. Romberg positive. Can not perform a tandem gait.    Gait: Paraparetic and ataxic, possible without assistance, needs unilateral assistance for longer distances.     EDSS score = 6.0  .........................................................................................................................................Ashlee Christian    VISIT SUMMARY:  Ms. TIMMYA BLAZIER, a 68 y.o. Caucasian right handed female  presented for the follow-up of MS. Ms. DANAYE SOBH voiced a complete understanding of the diagnostic and treatment plan as detailed above.   Start of Visit Time: 16:03h  End of Visit Time: 16:55h  Total visit time = 52 minutes    Greater than 50% of the face to face time was spent in consultation on the  disease process, and treatment planning, medication, dosing and side effects.     Thank you for the opportunity to contribute to the care of Ashlee Christian.

## 2018-10-02 MED ORDER — EMPTY CONTAINER
3 refills | 0 days
Start: 2018-10-02 — End: ?

## 2018-10-02 MED ORDER — DALFAMPRIDINE ER 10 MG TABLET,EXTENDED RELEASE,12 HR
ORAL_TABLET | Freq: Two times a day (BID) | ORAL | 0 refills | 0.00000 days | Status: CP
Start: 2018-10-02 — End: 2018-10-23
  Filled 2018-10-03: qty 60, 30d supply, fill #0

## 2018-10-02 NOTE — Unmapped (Signed)
[facility] Special[facility] Medication Referral: PA Approved      Medication (Brand/Generic): Dalfampridine 10mg     Final Test Claim completed with resulted information below:    Patient ABLE to fill at [facility]  Insurance Company:  [facility]  Anticipated Copay: $0  Is anticipated copay with a copay card or grant? Yes    Does patient's insurance plan only allow a 15 day supply for the first 6 fills in the Split Fill Program? No  If yes, inform patient they can request to dis-enroll from the [facility] by calling the patient help desk at N/A.      If the copay is under the $25 defined limit, per policy there will be no further investigation of need for financial assistance at this time unless patient requests. This referral has been communicated to the provider and handed off to the [facility] team for further processing and filling of prescribed medication.   ______________________________________________________________________  Please utilize this referral for viewing purposes as it will serve as the central location for all relevant documentation and updates.

## 2018-10-02 NOTE — Unmapped (Signed)
Patient called this morning stating she could not get her Dalfampridine 10 mg Tb 12 ordered by Dr. Johnnye Lana on 09/30/2018. I called the pharmacy and was told that the medication requires a PA. Patient was notified and information forwarded.

## 2018-10-02 NOTE — Unmapped (Signed)
Patient unable to fill dalfampridine at [facility] and has been using [facility] pharmacy to fill her DMT. Will resending dalfampridine to [facility].    Worthy Flank, PharmD, CPP  Clinical Pharmacist, [facility]  Phone: [phone]

## 2018-10-02 NOTE — Unmapped (Signed)
INCOMPLETE    [facility]   Patient Onboarding/Medication Counseling    Ashlee Christian is a 68 y.o. female with multiple sclerosis who I am counseling today on initiation of therapy.    Verified patient's date of birth / HIPAA.    Specialty medication(s) to be sent: Neurology: Plegridy      Non-specialty medications/supplies to be sent: sharps?      Medications not needed at this time: none         Plegridy (peginterferon beta-1a)    Medication & Administration     Dosage: Inject 1 orange pen ( ) under the skin on day 1, then 1 blue pen ( ) on day 15, then 1 gray pen ( ) every 14 days beginning on day 29.    Administration: Inject under the skin of the abdomen, thigh or back of upper arm; rotate injection sites. Rotate sites with each injection.  ? Injection instructions (Pen)  o Remove 1 pen from the refrigerator and allow to sit at room temperature for at least 30 minutes.  ? If you are using the starter pack you will choose the orange pen for your day 1 injection and the blue pen for your day 15 injection.  ? You will choose the gray pen for all subsequent doses.  o Examine the pen and liquid through the medication window  ? Check the expiration date  ? Ensure you see green stripes in the injection status window  ? Ensure the liquid is clear and colorless. Air bubbles may be present and this is normal.  o Choose your injection site and clean thoroughly with an alcohol wipe. Let the area air dry before injecting.  o When you are ready to inject remove the Plegridy pen cap.  o Place your pen on the injection site at a 90 degree angle, oriented so you can see the injection status window.  o Firmly press down the pen and hold  ? You will hear a clicking sound when the injection begins and the pen will continue to click during administration  o When the clicking has stopped, check the injection status window and make sure you see green checkmarks.  o Lift your pen from the injection site and double check the medication window. You should see a yellow plunger.  o You may see a small amount of blood at the injection site. Wipe gently with a clean cotton ball/gauze and apply a bandaid if needed.  o Dispose of your pen in a sharps container.    Adherence/Missed dose instructions: If you miss a dose you may take it as soon as you remember. If it is close to the time of your next dose, skip the missed dose and resume your normal schedule. Do not take 2 doses at once.    Goals of Therapy     Reduce flare-ups and slow MS disease progression    Side Effects & Monitoring Parameters     ? Flu-like symptoms (headache, weakness, fever, shakes, aches,, pains and sweating)  o You may take acetaminophen or ibuprofen prior to injection to help reduce this  ? Upset stomach/vomiting  ? Muscle or joint pain  ? Injection site irritation    The following side effects should be reported to the provider:  ? Signs of an allergic reaction  ? Signs of depressed mood  ? Signs of thyroid problems (weight change, feeling nervous/excitable/restless or weak, hair thinning, depression, neck swelling, heat/cold intolerance, sweating)  ? Shortness of breath,  large weight gain, abnormal heartbeat or swelling of the arms or legs  ? Signs of liver problems (yellowing of the skin/eyes, light colored stools, dark urine, severe stomach pain/vomiting, loss of appetite)  o LFTs prior to initiation and periodically during therapy  ? Signs of infection (fever, chills, or sore throat)  ? Unexplained bruising or bleeding  ? Severe fatigue      Contraindications, Warnings & Precautions     ? Cardiovascular (monitor patients with pre-existing heart conditions for worsening during initiation and continuation of treatment)  ? Thyroid dysfunction (abnormalities may develop or pre-existing conditions may be exacerbated)  ? Psychiatric disorders (PHQ9 screening prior to initiation, then as clinically indicated)    Drug/Food Interactions     ? Medication list reviewed in Epic. The patient was instructed to inform the care team before taking any new medications or supplements. No drug interactions identified.     Storage, Handling Precautions, & Disposal     ? Plegridy should be stored in the refrigerator.  ? Plegridy may be stored at room temperature for a maximum of 30 days  o Plegridy may be removed from, and returned to refrigeration if needed though the total combined time should not exceed 30 days.  ? You should dispose of used devices in a sharps container.      Current Medications (including OTC/herbals), Comorbidities and Allergies     Current Outpatient Medications   Medication Sig Dispense Refill   ??? baclofen (LIORESAL) 20 MG tablet TAKE 1 TABLET THREE TIMES DAILY 270 tablet 1   ??? BIOTIN ORAL Take 1 tablet by mouth once daily.     ??? buPROPion (WELLBUTRIN SR) 150 MG 12 hr tablet Take 1 tablet (150 mg total) by mouth Two (2) times a day. 180 tablet 1   ??? calcium carbonate/vitamin D3 (CALCIUM 500 + D ORAL) Take 1 tablet by mouth daily.     ??? carBAMazepine (CARBATROL) 200 MG 12 hr capsule Decrease carbamazepine to 200 mg in the morning, 200 mg lunchtime and 400 mg in the evening, for a week, then 200 mg three times a day. 90 capsule 2   ??? cholecalciferol, vitamin D3, 2,000 unit cap Take 4,000 Units by mouth daily.      ??? clobetasol (TEMOVATE) 0.05 % cream Apply 1 application topically Two (2) times a day.     ??? clonazePAM (KLONOPIN) 0.5 MG tablet Take 1 mg in the morning, 0.5 mg at lunchtime and 1 mg at bedtime. 150 tablet 2   ??? dalfampridine 10 mg Tb12 Take 1 tablet (10 mg total) by mouth two (2) times a day. 60 tablet 0   ??? DULoxetine (CYMBALTA) 20 MG capsule Take 1 capsule (20 mg total) by mouth daily. 90 capsule 1   ??? gabapentin (NEURONTIN) 300 MG capsule TAKE 1 CAPSULE (300 MG TOTAL) BY MOUTH THREE (3) TIMES A DAY. 270 capsule 1   ??? Lactobacillus acidophilus (PROBIOTIC ORAL) Take 1 tablet by mouth daily.     ??? losartan (COZAAR) 25 MG tablet Take 1 tablet (25 mg total) by mouth daily. 90 tablet 3   ??? meloxicam (MOBIC) 15 MG tablet Take 1 tablet (15 mg total) by mouth daily. 90 tablet 1   ??? multivitamin (TAB-A-VITE/THERAGRAN) per tablet Take 1 tablet by mouth daily.     ??? NARCAN 4 mg/actuation nasal spray CALL 911. ADMINISTER A SINGLE SPRAY OF NARCAN IN ONE NOSTRIL. REPEAT EVERY 3 MINUTES AS NEEDED IF NO OR MINIMAL RESPONSE.  0   ???  omeprazole (PRILOSEC) 20 MG capsule Take 1 capsule (20 mg total) by mouth daily. 90 capsule 3   ??? peginterferon beta-1a (PLEGRIDY) 125 mcg/0.5 mL PnIj Inject the contents of 1 pen (125 mcg) under the skin every fourteen (14) days. Starting from Day 29 following titration. 1 mL 4   ??? peginterferon beta-1a (PLEGRIDY) 63 mcg/0.5 mL- 94 mcg/0.5 mL PnIj Inject the contents of 1 pen (63 mcg) under the skin on day 1. Then inject the contents of 1 pen (94 mcg) on day 15. 1 mL 0   ??? polyethylene glycol (GLYCOLAX) 17 gram/dose powder Take 17 g by mouth every other day.        No current facility-administered medications for this visit.        Allergies   Allergen Reactions   ??? Amantadine Nausea Only   ??? Codeine Itching       Patient Active Problem List   Diagnosis   ??? Multiple sclerosis (CMS-HCC)   ??? Binocular vision disorder with diplopia   ??? Spinal stenosis of cervical region   ??? Hypertension   ??? Cognitive complaints   ??? Neuropathic pain   ??? Bilateral occipital neuralgia   ??? Depression   ??? Hx of fusion of cervical spine   ??? Hesitancy of micturition   ??? Anxiety   ??? Hyperinflation of lungs   ??? Cough   ??? Insomnia   ??? Leucopenia   ??? Low sodium levels   ??? Cognitive change   ??? Other chest pain   ??? Chronic fatigue       Reviewed and up to date in Epic.    Appropriateness of Therapy     Is medication and dose appropriate based on diagnosis? Yes    Baseline Quality of Life Assessment     How many days over the past month did your disease keep you from your normal activities? 0    Financial Information     Medication Assistance provided: Prior Authorization and [employer]    Anticipated copay of $0 reviewed with patient. Verified delivery address.    Delivery Information     Scheduled delivery date: 10/03/18    Expected start date: 10/03/18    Medication will be delivered via Same Day Courier to the home address in [place].  This shipment will not require a signature.      Explained the services we provide at [facility] and that each month we would call to set up refills.  Stressed importance of returning phone calls so that we could ensure they receive their medications in time each month.  Informed patient that we should be setting up refills 7-10 days prior to when they will run out of medication.  A pharmacist will reach out to perform a clinical assessment periodically.  Informed patient that a welcome packet and a drug information handout will be sent.      Patient verbalized understanding of the above information as well as how to contact the pharmacy at [phone] option 4 with any questions/concerns.  The pharmacy is open Monday through Friday 8:30am-4:30pm.  A pharmacist is available 24/7 via pager to answer any clinical questions they may have.    Patient Specific Needs     ? Does the patient have any physical, cognitive, or cultural barriers? No    ? Patient prefers to have medications discussed with  Patient     ? Is the patient able to read and understand education materials at  a high school level or above? Yes    ? Patient's primary language is  English     ? Is the patient high risk? No     ? Does the patient require a Care Management Plan? No     ? Does the patient require physician intervention or other additional services (i.e. nutrition, smoking cessation, social work)? No      Arnold Long  [facility] Specialty Pharmacist

## 2018-10-02 NOTE — Unmapped (Signed)
Ok, thanks.

## 2018-10-02 NOTE — Unmapped (Signed)
Received message from [employer] stating that this medication is a specialty medication and must be filled at the pharmacy the patient get the Plegridy. Information forwarded.

## 2018-10-02 NOTE — Unmapped (Signed)
Information given to the patient. Patient verbalized her understanding.

## 2018-10-03 MED FILL — DALFAMPRIDINE ER 10 MG TABLET,EXTENDED RELEASE,12 HR: 30 days supply | Qty: 60 | Fill #0 | Status: AC

## 2018-10-03 MED FILL — EMPTY CONTAINER: 120 days supply | Qty: 1 | Fill #0 | Status: AC

## 2018-10-03 MED FILL — PLEGRIDY 63 MCG/0.5 ML-94 MCG/0.5 ML SUBCUTANEOUS PEN INJECTOR: 28 days supply | Qty: 1 | Fill #0 | Status: AC

## 2018-10-03 MED FILL — EMPTY CONTAINER: 120 days supply | Qty: 1 | Fill #0

## 2018-10-03 NOTE — Unmapped (Signed)
Social worker tried to reach the patient to offer assistance with finding resources. Social worker left a message with call back information and request for a call back.

## 2018-10-03 NOTE — Unmapped (Signed)
[facility]   Patient Onboarding/Medication Counseling    Ashlee Christian is a 68 y.o. female with multiple sclerosis who I am counseling today on initiation of therapy.    Verified patient's date of birth / HIPAA.    Specialty medication(s) to be sent: Neurology: dalfampridine      Non-specialty medications/supplies to be sent: none      Medications not needed at this time: none         Ampyra (dalfampridine)    Medication & Administration     Dosage: Take 1 tablet (10mg ) by mouth every 12 hours.    Administration: Take by mouth without regards to meals. Do not crush, chew, dissolve or divide tablet.    Adherence/Missed dose instructions: If you miss a dose, skip the missed dose and resume your normal schedule.    Goals of Therapy     To improve walking/gait    Side Effects & Monitoring Parameters   ? Upset stomach  ? Constipation  ? Dizziness  ? Headache  ? Fatigue  ? Difficulty sleeping  ? Back pain  ? Nose and throat irritation    The following side effects should be reported to the provider:  ? Signs of an allergic reaction  ? Signs of a UTI (blood in urine, buring or pain with urination, increased urinary frequency/urgency, fever, low stomach or pelvic pain)  ? Severe dizziness or passing out  ? Severe fatigue  ? Shortness of breath  ? Changes in balance  ? Burning, numbness or tingling that is not normal  ? Seizures       Contraindications, Warnings & Precautions     ? Contraindications:  o History of seizures  o Moderate or severe renal impairment (CrCl <=60ml/min)  ? Seizures (discontinue use and do not reinitiate if seizures occur)    Drug/Food Interactions     ? Medication list reviewed in Epic. The patient was instructed to inform the care team before taking any new medications or supplements. No drug interactions identified   Storage, Handling Precautions, & Disposal   ? Store Ampyra at room temperature in the original labelled container.      Current Medications (including OTC/herbals), Comorbidities and Allergies     Current Outpatient Medications   Medication Sig Dispense Refill   ??? baclofen (LIORESAL) 20 MG tablet TAKE 1 TABLET THREE TIMES DAILY 270 tablet 1   ??? BIOTIN ORAL Take 1 tablet by mouth once daily.     ??? buPROPion (WELLBUTRIN SR) 150 MG 12 hr tablet Take 1 tablet (150 mg total) by mouth Two (2) times a day. 180 tablet 1   ??? calcium carbonate/vitamin D3 (CALCIUM 500 + D ORAL) Take 1 tablet by mouth daily.     ??? carBAMazepine (CARBATROL) 200 MG 12 hr capsule Decrease carbamazepine to 200 mg in the morning, 200 mg lunchtime and 400 mg in the evening, for a week, then 200 mg three times a day. 90 capsule 2   ??? cholecalciferol, vitamin D3, 2,000 unit cap Take 4,000 Units by mouth daily.      ??? clobetasol (TEMOVATE) 0.05 % cream Apply 1 application topically Two (2) times a day.     ??? clonazePAM (KLONOPIN) 0.5 MG tablet Take 1 mg in the morning, 0.5 mg at lunchtime and 1 mg at bedtime. 150 tablet 2   ??? dalfampridine 10 mg Tb12 Take 1 tablet (10 mg total) by mouth two (2) times a day. 60 tablet 0   ???  DULoxetine (CYMBALTA) 20 MG capsule Take 1 capsule (20 mg total) by mouth daily. 90 capsule 1   ??? empty container Misc Use as directed to dispose of injectable medications 1 each 3   ??? gabapentin (NEURONTIN) 300 MG capsule TAKE 1 CAPSULE (300 MG TOTAL) BY MOUTH THREE (3) TIMES A DAY. 270 capsule 1   ??? Lactobacillus acidophilus (PROBIOTIC ORAL) Take 1 tablet by mouth daily.     ??? losartan (COZAAR) 25 MG tablet Take 1 tablet (25 mg total) by mouth daily. 90 tablet 3   ??? meloxicam (MOBIC) 15 MG tablet Take 1 tablet (15 mg total) by mouth daily. 90 tablet 1   ??? multivitamin (TAB-A-VITE/THERAGRAN) per tablet Take 1 tablet by mouth daily.     ??? NARCAN 4 mg/actuation nasal spray CALL 911. ADMINISTER A SINGLE SPRAY OF NARCAN IN ONE NOSTRIL. REPEAT EVERY 3 MINUTES AS NEEDED IF NO OR MINIMAL RESPONSE.  0   ??? omeprazole (PRILOSEC) 20 MG capsule Take 1 capsule (20 mg total) by mouth daily. 90 capsule 3   ??? peginterferon beta-1a (PLEGRIDY) 125 mcg/0.5 mL PnIj Inject the contents of 1 pen (125 mcg) under the skin every fourteen (14) days. Starting from Day 29 following titration. 1 mL 4   ??? peginterferon beta-1a (PLEGRIDY) 63 mcg/0.5 mL- 94 mcg/0.5 mL PnIj Inject the contents of 1 pen (63 mcg) under the skin on day 1. Then inject the contents of 1 pen (94 mcg) on day 15. 1 mL 0   ??? polyethylene glycol (GLYCOLAX) 17 gram/dose powder Take 17 g by mouth every other day.        No current facility-administered medications for this visit.        Allergies   Allergen Reactions   ??? Amantadine Nausea Only   ??? Codeine Itching       Patient Active Problem List   Diagnosis   ??? Multiple sclerosis (CMS-HCC)   ??? Binocular vision disorder with diplopia   ??? Spinal stenosis of cervical region   ??? Hypertension   ??? Cognitive complaints   ??? Neuropathic pain   ??? Bilateral occipital neuralgia   ??? Depression   ??? Hx of fusion of cervical spine   ??? Hesitancy of micturition   ??? Anxiety   ??? Hyperinflation of lungs   ??? Cough   ??? Insomnia   ??? Leucopenia   ??? Low sodium levels   ??? Cognitive change   ??? Other chest pain   ??? Chronic fatigue       Reviewed and up to date in Epic.    Appropriateness of Therapy     Is medication and dose appropriate based on diagnosis? Yes    Baseline Quality of Life Assessment     How many days over the past month did your disease keep you from your normal activities? 0    Financial Information     Medication Assistance provided: Prior Authorization and [employer]    Anticipated copay of $0 reviewed with patient. Verified delivery address.    Delivery Information     Scheduled delivery date: 10/03/18    Expected start date: 10/03/18    Medication will be delivered via Same Day Courier to the home address in [place].  This shipment will not require a signature.      Explained the services we provide at [facility] and that each month we would call to set up refills.  Stressed importance of returning phone calls so  that we could ensure they receive their medications in time each month.  Informed patient that we should be setting up refills 7-10 days prior to when they will run out of medication.  A pharmacist will reach out to perform a clinical assessment periodically.  Informed patient that a welcome packet and a drug information handout will be sent.      Patient verbalized understanding of the above information as well as how to contact the pharmacy at [phone] option 4 with any questions/concerns.  The pharmacy is open Monday through Friday 8:30am-4:30pm.  A pharmacist is available 24/7 via pager to answer any clinical questions they may have.    Patient Specific Needs     ? Does the patient have any physical, cognitive, or cultural barriers? No    ? Patient prefers to have medications discussed with  Patient     ? Is the patient able to read and understand education materials at a high school level or above? Yes    ? Patient's primary langua/ge is  English     ? Is the patient high risk? No     ? Does the patient require a Care Management Plan? No     ? Does the patient require physician intervention or other additional services (i.e. nutrition, smoking cessation, social work)? No      Arnold Long  [facility] Specialty Pharmacist

## 2018-10-04 ENCOUNTER — Other Ambulatory Visit: Admit: 2018-10-04 | Discharge: 2018-10-05 | Payer: MEDICARE

## 2018-10-04 DIAGNOSIS — G35 Multiple sclerosis: Principal | ICD-10-CM

## 2018-10-04 DIAGNOSIS — R4189 Other symptoms and signs involving cognitive functions and awareness: Secondary | ICD-10-CM

## 2018-10-04 DIAGNOSIS — R5382 Chronic fatigue, unspecified: Secondary | ICD-10-CM

## 2018-10-04 LAB — MONOCYTES ABSOLUTE COUNT: Lab: 0.4

## 2018-10-04 LAB — CBC W/ AUTO DIFF
BASOPHILS ABSOLUTE COUNT: 0 10*9/L (ref 0.0–0.1)
BASOPHILS RELATIVE PERCENT: 0.6 %
EOSINOPHILS RELATIVE PERCENT: 3.3 %
HEMATOCRIT: 41 % (ref 36.0–46.0)
HEMOGLOBIN: 14.2 g/dL (ref 13.5–16.0)
LARGE UNSTAINED CELLS: 2 % (ref 0–4)
LYMPHOCYTES RELATIVE PERCENT: 11 %
MEAN CORPUSCULAR HEMOGLOBIN CONC: 34.5 g/dL (ref 31.0–37.0)
MEAN CORPUSCULAR HEMOGLOBIN: 32.7 pg (ref 26.0–34.0)
MEAN CORPUSCULAR VOLUME: 94.6 fL (ref 80.0–100.0)
MEAN PLATELET VOLUME: 6.4 fL — ABNORMAL LOW (ref 7.0–10.0)
MONOCYTES ABSOLUTE COUNT: 0.4 10*9/L (ref 0.2–0.8)
MONOCYTES RELATIVE PERCENT: 7.3 %
NEUTROPHILS ABSOLUTE COUNT: 3.8 10*9/L (ref 2.0–7.5)
NEUTROPHILS RELATIVE PERCENT: 75.8 %
PLATELET COUNT: 321 10*9/L (ref 150–440)
RED BLOOD CELL COUNT: 4.33 10*12/L (ref 4.00–5.20)
RED CELL DISTRIBUTION WIDTH: 13 % (ref 12.0–15.0)
WBC ADJUSTED: 5.1 10*9/L (ref 4.5–11.0)

## 2018-10-04 LAB — COMPREHENSIVE METABOLIC PANEL
ALBUMIN: 4.6 g/dL (ref 3.5–5.0)
ALKALINE PHOSPHATASE: 92 U/L (ref 38–126)
ALT (SGPT): 14 U/L (ref <35)
ANION GAP: 7 mmol/L (ref 7–15)
AST (SGOT): 18 U/L (ref 14–38)
BILIRUBIN TOTAL: 0.2 mg/dL (ref 0.0–1.2)
BLOOD UREA NITROGEN: 19 mg/dL (ref 7–21)
BUN / CREAT RATIO: 32
CHLORIDE: 97 mmol/L — ABNORMAL LOW (ref 98–107)
CO2: 31 mmol/L — ABNORMAL HIGH (ref 22.0–30.0)
CREATININE: 0.59 mg/dL — ABNORMAL LOW (ref 0.60–1.00)
EGFR CKD-EPI AA FEMALE: >90 mL/min/{1.73_m2} (ref >=60)
EGFR CKD-EPI NON-AA FEMALE: >90 mL/min/{1.73_m2} (ref >=60)
GLUCOSE RANDOM: 104 mg/dL (ref 65–179)
POTASSIUM: 5.2 mmol/L — ABNORMAL HIGH (ref 3.5–5.0)
PROTEIN TOTAL: 7.1 g/dL (ref 6.5–8.3)
SODIUM: 135 mmol/L (ref 135–145)

## 2018-10-04 LAB — THYROID STIMULATING HORMONE: Thyrotropin:ACnc:Pt:Ser/Plas:Qn:: 2.654

## 2018-10-04 LAB — GLUCOSE RANDOM: Glucose:MCnc:Pt:Ser/Plas:Qn:: 104

## 2018-10-04 NOTE — Unmapped (Signed)
Social workre received a call back from the pt. Social worker offered assistance with finding support groups and information regarding service dogs. Social worker answered the pt's questions and assisted with problem solving skills. Social worker and pt's call lasted 1 hour and 13 minutes. Social worker followed up the call with a detailed MyChart message with contact info for resources discussed.

## 2018-10-05 NOTE — Unmapped (Signed)
Sodium level is now normal, potassium level a bit elevated, NCS. TSH WNL    Ashlee Christian

## 2018-10-07 ENCOUNTER — Ambulatory Visit: Admit: 2018-10-07 | Discharge: 2018-10-08 | Payer: MEDICARE

## 2018-10-07 DIAGNOSIS — L988 Other specified disorders of the skin and subcutaneous tissue: Secondary | ICD-10-CM

## 2018-10-07 DIAGNOSIS — L578 Other skin changes due to chronic exposure to nonionizing radiation: Secondary | ICD-10-CM

## 2018-10-07 DIAGNOSIS — C44319 Basal cell carcinoma of skin of other parts of face: Secondary | ICD-10-CM

## 2018-10-07 DIAGNOSIS — C4491 Basal cell carcinoma of skin, unspecified: Principal | ICD-10-CM

## 2018-10-07 DIAGNOSIS — L814 Other melanin hyperpigmentation: Secondary | ICD-10-CM

## 2018-10-07 NOTE — Unmapped (Signed)
Caprice Beaver, M.D.  [facility] Mohs Surgery  SUTURED WOUND / SKIN GRAFT CARE    ? Avoid all strenuous activity for one week. You may resume moderate activity in 48 hours.     ? If site is on your upper body, plan to sleep with your head elevated. Avoid bending over, as this can cause bleeding to occur.  ? Take Tylenol or your prescribed medication as needed for discomfort. Do NOT take both (do not exceed two 325mg  tablets of Tylenol every 4 hours).    ? No alcoholic beverages for 48 hours.    ? Avoid smoking, as it is detrimental to wound healing.    ? Keep the pressure bandage on for 24 hours; this helps prevent bleeding. If the bandage becomes blood-tinged or loose, reinforce it with gauze and tape. Remove after 24 hours.    ? Leave the flat, inner bandage in place for one week or until you come in for your follow up appointment. If the bandage becomes blood-tinged or loose, reinforce it with gauze and tape.  ? If active bleeding occurs:  1. Use tightly rolled up gauze or cloth to apply direct pressure over the bandage for 20 minutes.  2. Reapply pressure for an additional 20 minutes if necessary.  3. If two rounds of pressure fail to stop the bleeding, call [phone] during office hours. For after hours, call [phone] or go to the nearest emergency room.  4. Use additional gauze and tape to maintain pressure once the bleeding has stopped.  ? Keep the bandages dry.     ? Wound Healing:  1. It is normal to have swelling and bruising around the surgical site. The bruising will fade in approximately 10-14 days. Elevate the area to reduce swelling.  2. Numbness, itchiness and sensitivity to temperature changes can occur after surgery and may take up to 18 months to normalize.  3. Although wounds may appear healed within a week, it takes at least 3 months for complete healing and 12 months for a scar to take its final appearance.  4. Post-operative pain should slowly get better, beginning the evening after surgery. 5. A sudden or severe increase in pain may indicate a problem. Call our office if this occurs.  6. For skin grafts, avoid prolonged exposure to extremely cold temperatures for 3 weeks.    For any questions or concerns, call the Mohs clinic: [phone]  For after-hours emergencies, call Dr. Merritt???s cell phone: [phone]

## 2018-10-08 NOTE — Unmapped (Signed)
ASSESSMENT AND PLAN:     1) BCC of the right cheek treated via Mohs surgery today with primary closure for wound management   2) Diagnosis, etiology, natural history, and treatment options discussed at length, emphasizing the importance of photoprotection.   3) Actinic damage-sun protection measures discussed/advised.  4) Prognosis and future surveillance discussed.  5) Letter with treatment outcome sent to referring provider.      INDICATION FOR MOHS SURGERY: location, size, histology, or recurrence    STAGE I: For the right cheek timeout performed at 11:30 a.m. Informed consent obtained. Anesthesia achieved with 1% lidocaine with 1:100,000 epinephrine. ChloraPrep applied. one section(s) excised using Mohs technique. Frozen section analysis revealed a negative margin.      Options for management of the wound were discussed. Given the location and size/depth of the wound, Primary repair planned.  Burow's triangles planned to follow relaxed skin tension lines.  Anesthesia achieved, chloraprep applied and a sterile drape placed.  Burow's triangles were excised.  Undermining carried out widely in a subcutaneous plane to facilitate tension-free closure.  Layered closure completed using 6.0 Vicryl for buried vertical mattress sutures followed by 6.0 absorbing gut in a running cuticular technique.    Final size of the defect: 2.4 cm   Final length of the repair: 3.3 cm     Bandage applied and wound and pain management discussed.  RTC in 1 week or as needed per patient.        HISTORY OF PRESENT ILLNESS: Ms. Buswell is seen in consultation at the request of Wynelle Beckmann  for biopsy-proven BCC.   She notes that the area has been present for about a few moinths  increasing in size with no symptoms .  There is a history of previous treatment.  Reports no other new or changing lesions and has no other complaints today.    ALLERGIES: reviewed  MEDICATIONS: reviewed    PAST HISTORY: reviewed  FAMILY HISTORY: reviewed SOCIAL HISTORY: nonsmoker    REVIEW OF SYSTEMS: No fevers, chills, weight loss, lymphadenopathy or other skin concerns.    PHYSICAL EXAMINATION:  GENERAL: A well-appearing woman in no acute distress, alert, appropriately oriented and interactive.  SKIN: Examination of the face, eyelids, lips, neck, scalp notable for a 2.1 cm pink plaque on the right cheek. There are rhytids, telangiectasias, and lentigines, consistent with photodamage.    Biopsy report(s) reviewed, confirming the diagnosis.

## 2018-10-10 NOTE — Unmapped (Signed)
Social worker contacted the pt to follow up on resources offered. Social worker had to leave a message.

## 2018-10-12 NOTE — Unmapped (Signed)
Pt returned the social workers call. Pt and social worker discussed resource follow up Child psychotherapist discussed letter for service animal and ways to possibly find or start a support group in the local area of the pt. Social worker sent the pt release of information forms so a referral to the MS Society can be made and a letter to [employer] for assistance can be sent.

## 2018-10-14 ENCOUNTER — Ambulatory Visit: Admit: 2018-10-14 | Discharge: 2018-10-15 | Payer: MEDICARE

## 2018-10-14 DIAGNOSIS — Z4800 Encounter for change or removal of nonsurgical wound dressing: Principal | ICD-10-CM

## 2018-10-14 NOTE — Unmapped (Signed)
Caprice Beaver, M.D.  [facility] ([facility]) Mohs Surgery  ONE WEEK AFTER SURGERY    ? Begin daily wound care as follows:  1. Clean the area with soapy water using a Q-tip or gauze pad (shower/bathe normally).  2. Dry the wound with a Q-tip or gauze pad.  3. Apply Vaseline, Polysporin or Bacitracin ointment with a Q-tip.   * Do NOT use Neosporin ointment *  4. Cover the wound with a Band-Aid or non-stick gauze pad and paper tape.  5. Repeat wound care once a day for one week.     ? After one week, continue to apply ointment until fully healed. March 2nd    ? You may resume your regular skin care routine, including applying moisturizer, make-up and sunscreen.    ? Massaging the area will help the scar soften and fade more quickly. Begin to massage the area one month after the bandages have been removed. To massage, apply pressure directly and firmly over the scar with the fingertips and move in a circular motion. Massage the area several times a day for a few minutes at a time. April 2nd    ? Wound Healing:  1. Once the bandages are removed, the scar will be red and firm (especially in the lip/chin area). This is normal and will fade in time. It might take 6-12 months for this to happen.  2. Approximately 6-8 weeks after surgery, it is not uncommon to see the formation of ???tender, pimple like??? bumps along the scar. As the scar continues to mature and the stitches underneath the skin begin to dissolve, this might occur. Do not pick or squeeze the bumps; they will resolve on their own. Should one break open, producing a small amount of drainage, apply Polysporin or Bacitracin ointment a few times a day until the wound is completely healed.  3. Numbness in the surgical area is expected. It might take 12-18 months for sensation to return to normal. During this time, itchiness, tingling, and occasional sharp pains might occur. These sensations are normal and will subside once the nerves have completely healed.    For any questions or concerns, call the Mohs Clinic: [phone]  For after-hours emergencies, call Dr. Merritt???s cell: [phone]

## 2018-10-14 NOTE — Unmapped (Signed)
1 Week Mohs Surgery Follow Up    Site: Right cheek  Repair: 1??  Follow Up: PRN    Additional Notes: Patient is healing well with no questions or concerns. Photos were taken, site re-bandaged, further wound care instruction was provided and follow up with a general dermatologist was recommended.

## 2018-10-18 NOTE — Unmapped (Signed)
I called the patient today discuss her symptoms from her Plegridy. She took her second dose yesterday. She reports having nausea and vomiting last night. She had insomnia  Last night. Her joints and entire body is in pain. Her pain is at an 6 now and an 8 at worse.Pain is described as an aching pain. She takes Meloxicam for arthritis and stated that helps but does not take the pain away. She reported chills yesterday but no feer. Patient is feeling wiped out this morning. She has not tried to get up. I have advised that she stay hydrated. I told her that I will forward her information to our providers and get back to her. Patient verbalized her understanding.

## 2018-10-18 NOTE — Unmapped (Signed)
-----   Message from Mervyn Gay, PharmD sent at 10/18/2018  9:38 AM EST -----  Regarding: Reaction to Injection  Good morning,    Dr. Patty Sermons patient just called [place] to report she is not doing well after her second Plegridy injection. She stated the first injection went well but she immediately felt dizzy after the second one. This seemed to subside until later on in the day when she became sicker and sicker and sicker with flu like symptoms. Patient reported she did not sleep well last night, currently has no appetite, and is nauseas to the point where she vomits even attempting to brush her teeth. Patient is currently in bed and was advised to follow up with your office immediately. Please advise if we can be of further assistance.     Corliss Skains   [facility]

## 2018-10-18 NOTE — Unmapped (Signed)
Received call from patient stating she was not doing well after 2nd dose of Plegridy. Patient stated she did well after the 1st injection but immediately felt dizzy after the 2nd. This seemed to subside until later on in the day when she felt sicker and sicker and sicker with flu like symptoms, unable to sleep, loss of appetite, and feeling nauseas to the point where even if she attempts to brush her teeth she vomits. Patient stated she was currently laying in bed. When asked, patient verified physician had not been notified and was instructed to call office immediately. Messaged physician as well.

## 2018-10-18 NOTE — Unmapped (Signed)
Patient confirmed that she takes her Meloxicam first thing in the morning around 6 am to 8 am.   Patient could not tell me the exact dosage of the Plegridy because she got prefilled syringes. She does confirm that she is titrating upwards.

## 2018-10-18 NOTE — Unmapped (Signed)
I called the patient back as requested and asked her what time she was taking the Plegridy. Patient stated that both doses were administered in the afternoon. I advised that she take future doses about 30 minutes after taking the Meloxicam. Patient verbalized her understanding.   I also explained that the flu like symptoms she has experienced are known to happen with Plegridy and may get better with time.  Patient stated she has not had anymore episodes of vomiting but has only been sipping water. I advised that if vomiting persists, she should schedule a 60 minute appointment with Yolande Jolly early next week as this is not due to Plegridy. Patient verbalized her understanding.

## 2018-10-23 DIAGNOSIS — G35 Multiple sclerosis: Principal | ICD-10-CM

## 2018-10-24 DIAGNOSIS — G35 Multiple sclerosis: Principal | ICD-10-CM

## 2018-10-24 MED FILL — PLEGRIDY 125 MCG/0.5 ML SUBCUTANEOUS PEN INJECTOR: SUBCUTANEOUS | 28 days supply | Qty: 1 | Fill #0

## 2018-10-24 MED FILL — PLEGRIDY 125 MCG/0.5 ML SUBCUTANEOUS PEN INJECTOR: 28 days supply | Qty: 1 | Fill #0 | Status: AC

## 2018-10-24 NOTE — Unmapped (Signed)
[facility] Clinical Assessment & Refill Coordination Note    Ashlee Christian, DOB: 06/15/1951  Phone: [phone] (home)     All above HIPAA information was verified with patient.     Specialty Medication(s):   Neurology: Plegridy and dalfampridine      Current Outpatient Medications   Medication Sig Dispense Refill   ??? baclofen (LIORESAL) 20 MG tablet TAKE 1 TABLET THREE TIMES DAILY 270 tablet 1   ??? BIOTIN ORAL Take 1 tablet by mouth once daily.     ??? buPROPion (WELLBUTRIN SR) 150 MG 12 hr tablet Take 1 tablet (150 mg total) by mouth Two (2) times a day. 180 tablet 1   ??? calcium carbonate/vitamin D3 (CALCIUM 500 + D ORAL) Take 1 tablet by mouth daily.     ??? carBAMazepine (CARBATROL) 200 MG 12 hr capsule Decrease carbamazepine to 200 mg in the morning, 200 mg lunchtime and 400 mg in the evening, for a week, then 200 mg three times a day. 90 capsule 2   ??? cholecalciferol, vitamin D3, 2,000 unit cap Take 4,000 Units by mouth daily.      ??? clobetasol (TEMOVATE) 0.05 % cream Apply 1 application topically Two (2) times a day.     ??? clonazePAM (KLONOPIN) 0.5 MG tablet Take 1 mg in the morning, 0.5 mg at lunchtime and 1 mg at bedtime. 150 tablet 2   ??? dalfampridine 10 mg Tb12 Take 1 tablet (10 mg total) by mouth two (2) times a day. 60 tablet 0   ??? DULoxetine (CYMBALTA) 20 MG capsule Take 1 capsule (20 mg total) by mouth daily. 90 capsule 1   ??? empty container Misc Use as directed to dispose of injectable medications 1 each 3   ??? gabapentin (NEURONTIN) 300 MG capsule TAKE 1 CAPSULE (300 MG TOTAL) BY MOUTH THREE (3) TIMES A DAY. 270 capsule 1   ??? Lactobacillus acidophilus (PROBIOTIC ORAL) Take 1 tablet by mouth daily.     ??? losartan (COZAAR) 25 MG tablet Take 1 tablet (25 mg total) by mouth daily. 90 tablet 3   ??? meloxicam (MOBIC) 15 MG tablet Take 1 tablet (15 mg total) by mouth daily. 90 tablet 1   ??? multivitamin (TAB-A-VITE/THERAGRAN) per tablet Take 1 tablet by mouth daily.     ??? NARCAN 4 mg/actuation nasal spray CALL 911. ADMINISTER A SINGLE SPRAY OF NARCAN IN ONE NOSTRIL. REPEAT EVERY 3 MINUTES AS NEEDED IF NO OR MINIMAL RESPONSE.  0   ??? omeprazole (PRILOSEC) 20 MG capsule Take 1 capsule (20 mg total) by mouth daily. 90 capsule 3   ??? peginterferon beta-1a (PLEGRIDY) 125 mcg/0.5 mL PnIj Inject the contents of 1 pen (125 mcg) under the skin every fourteen (14) days. Starting from Day 29 following titration. 1 mL 4   ??? peginterferon beta-1a (PLEGRIDY) 63 mcg/0.5 mL- 94 mcg/0.5 mL PnIj Inject the contents of 1 pen (63 mcg) under the skin on day 1. Then inject the contents of 1 pen (94 mcg) on day 15. 1 mL 0   ??? polyethylene glycol (GLYCOLAX) 17 gram/dose powder Take 17 g by mouth every other day.        No current facility-administered medications for this visit.         Changes to medications: Javonna reports no changes reported at this time.    Allergies   Allergen Reactions   ??? Amantadine Nausea Only   ??? Codeine Itching       Changes  to allergies: No    SPECIALTY MEDICATION ADHERENCE     dalfampridine 10 mg: 10 days of medicine on hand   Plegridy 125 mcg/0.76ml: 0 days of medicine on hand       Medication Adherence    Patient reported X missed doses in the last month:  0  Specialty Medication:  DALFAMPRIDINE 10MG   Patient is on additional specialty medications:  Yes  Additional Specialty Medications:  Plegridy 125 mcg/0.37ml  Patient Reported Additional Medication X Missed Doses in the Last Month:  0  Demonstrates understanding of importance of adherence:  yes  Informant:  patient          Specialty medication(s) dose(s) confirmed: Regimen is correct and unchanged.     Are there any concerns with adherence? No    Adherence counseling provided? Not needed    CLINICAL MANAGEMENT AND INTERVENTION      Clinical Benefit Assessment:    Do you feel the medicine is effective or helping your condition? She is not sure if the Plegridy is helping yet. She is having a lot of problems with flu-like symptoms with her Plegridy titration    Clinical Benefit counseling provided? unable to accurately assess clinical benefit at this time since she just started the Plegridy.    Adverse Effects Assessment:    Are you experiencing any side effects? Yes, patient reports experiencing severe flu like symptoms. Side effect counseling provided: Patient has discussed her symptoms with her Neurology provider.  I told her to keep them informed if the symptoms are not improved with her next Plegridy injection    Are you experiencing difficulty administering your medicine? No    Quality of Life Assessment:    How many days over the past month did your Multiple Sclerosis  keep you from your normal activities? For example, brushing your teeth or getting up in the morning. 4    Have you discussed this with your provider? Yes    Therapy Appropriateness:    Is therapy appropriate? Yes, therapy is appropriate and should be continued    DISEASE/MEDICATION-SPECIFIC INFORMATION      For patients on injectable medications: Patient currently has 0 doses left.  Next injection is scheduled for 10/31/2018.    PATIENT SPECIFIC NEEDS     ? Does the patient have any physical, cognitive, or cultural barriers? No    ? Is the patient high risk? No     ? Does the patient require a Care Management Plan? No     ? Does the patient require physician intervention or other additional services (i.e. nutrition, smoking cessation, social work)? No      SHIPPING     Specialty Medication(s) to be Shipped:   Neurology: Plegridy and dalfampridine    Other medication(s) to be shipped: n/a     Changes to insurance: No    Delivery Scheduled: Yes, Expected medication delivery date: 10/24/2018.  However, Rx request for refills was sent to the provider as there are none remaining. (She needs a new Rx for dalfampridine)    Medication will be delivered via Same Day Courier to the confirmed home address in [place] [facility].    The patient will receive a drug information handout for each medication shipped and additional FDA Medication Guides as required.  Verified that patient has previously received a Conservation officer, historic buildings.    Roderic Palau   [facility] Specialty Pharmacist

## 2018-10-25 DIAGNOSIS — G35 Multiple sclerosis: Principal | ICD-10-CM

## 2018-10-25 NOTE — Unmapped (Signed)
Social worker got a call from the pt. Pt wanted help filling out the release of information forms sent. Social worker assisted the pt and explained return methods.

## 2018-10-25 NOTE — Unmapped (Signed)
Ashlee Christian 's DALFAMPRIDINE shipment will be delayed due to No refills We have contacted the patient and communicated the delivery change to patient/caregiver We will call the patient to reschedule the delivery upon resolution. We have confirmed the delivery date as N/A.

## 2018-10-28 MED ORDER — DALFAMPRIDINE ER 10 MG TABLET,EXTENDED RELEASE,12 HR
ORAL_TABLET | Freq: Two times a day (BID) | ORAL | 2 refills | 0 days | Status: CP
Start: 2018-10-28 — End: 2018-11-14
  Filled 2018-10-28: qty 60, 30d supply, fill #0

## 2018-10-28 MED FILL — DALFAMPRIDINE ER 10 MG TABLET,EXTENDED RELEASE,12 HR: 30 days supply | Qty: 60 | Fill #0 | Status: AC

## 2018-10-28 NOTE — Unmapped (Signed)
Ashlee Christian 's DALFAMPRIDINE shipment will be delayed due to No refills We have contacted the patient and communicated the delivery change to patient/caregiver We will reschedule the medication for the delivery date that the patient agreed upon. We have confirmed the delivery date as 3/9 (SAME DAY COURIER) .

## 2018-10-29 NOTE — Unmapped (Signed)
Pt sent a message asking for assistance after talking to the MS Navigator handling her referral. Social worker made a call to the MS Navigator to get details about the information that was provided and requested. MS Navigator Janna Arch provided details regarding the information she provided the pt. Rosalita Chessman stated that she will forward the email sent to the pt to the social worker for reference. Rosalita Chessman explained documentation that she needs to move forward with the patient's requests. Social worker will pass the information to the provider to get information needed.

## 2018-10-31 NOTE — Unmapped (Signed)
Refilled: 08-26-2018    Last OV: 08/26/2018    Future OV: 11/20/2018

## 2018-11-02 MED ORDER — DULOXETINE 20 MG CAPSULE,DELAYED RELEASE
ORAL_CAPSULE | Freq: Every day | ORAL | 1 refills | 0.00000 days | Status: CP
Start: 2018-11-02 — End: 2018-11-25

## 2018-11-05 NOTE — Unmapped (Signed)
Thank you. She should take Tylenol 1 pill 30 min prior to Plegridy and also continue taking Tylenol every 6 hours after Plegridy injection as long as she is having flu like symptoms. Not to take more than 4g/day of Tylenol.   Please inform the patient.  Thanks,  Sharmon Revere

## 2018-11-05 NOTE — Unmapped (Signed)
Instructions given by Dr. Johnnye Lana in relation to treatment of post Plegridy symptoms discussed. Patient verbalized her understanding.

## 2018-11-05 NOTE — Unmapped (Signed)
[employer] nurse instructor Liz Beach called to inform pt has had first full dose of Plegridy on 10/31/18. Pt is on day 5 of not feeling well. Pt has no energy, flu like symptoms, and nausea. Pt sent mychart message on Sunday and did not hear anything back from office regarding this and didn't know who to reach out to. Told Nurse I will pass urgent message to Dr. Johnnye Lana about patient.     Lequita Meadowcroft, CPhT

## 2018-11-05 NOTE — Unmapped (Signed)
Called the patient to discuss her symptoms from the Plegridy injection. Patient took her third dose of Plegridy on 10/31/2018. She stated that her first dose was uneventful. After her second dose, she started to experience flu like symptoms which were described as, chills, aching joints and muscles, nausea ans vomiting with SOB. She stated she is no longer vomiting but her SOB is not new. She stated she had had this for the past three years.   She stated that she did not have a temperature. She does have a thermometer at home so she checked it while I waited and she told me that her temperature was 97.4 F.   Her next dose of Plegridy is 3/26.2020.  The [employer] nurse advised that she rest and take Tylenol and stay hydrated.  I advised that she stay hydrated, eat small meals and rest. I also told her that these symptoms go away with time.   I told her to call us if the symptoms got worse with her next dose.  Patient confirmed that she has not been outside of the country or been around anyone with the [place] virus.  Information forwarded.   The Covid-19 screening questions were completed.

## 2018-11-05 NOTE — Unmapped (Signed)
I did not receive any message from this patient.Sheral Flow, please call the patient to get an update on her symptoms and do the COVID-19 screening questions.   Thanks,  Sharmon Revere

## 2018-11-05 NOTE — Unmapped (Signed)
Dear Jola Babinski,  Please follow guidelines set up by the CDC and the [facility] MS Society for people on immunosuppressive medications. Most recently there have been restrictions as to being around no more than 10 people and avoiding restaurants and malls. Please keep yourself updated.   If you have questions relating to side effects of your medication you can also use the resource nurse number given to you with the drug packaging.  I hope this helps.  Eulas Post, RN.

## 2018-11-06 NOTE — Unmapped (Signed)
I called this patient today to follow up with her and see how she has been doing sine her complaint about side effects of Plegridy. Patient did not pick up. I left a vm asking for her to call me back when she received my message.

## 2018-11-12 NOTE — Unmapped (Addendum)
Continue your present medicines.    Continue your Al-Anon, meditation, and walking    Follow up in early August for wellness visit or sooner for problems.

## 2018-11-14 NOTE — Unmapped (Signed)
Pt self discontinued ampyra.  Ashlee Christian

## 2018-11-14 NOTE — Unmapped (Signed)
[facility] Clinical Assessment & Refill Coordination Note    Ashlee Christian, DOB: [birth year]  Phone: [phone] (home)     All above HIPAA information was verified with patient.     Specialty Medication(s):   Neurology: Plegridy and dalfampridine     Current Outpatient Medications   Medication Sig Dispense Refill   ??? baclofen (LIORESAL) 20 MG tablet TAKE 1 TABLET THREE TIMES DAILY 270 tablet 1   ??? BIOTIN ORAL Take 1 tablet by mouth once daily.     ??? buPROPion (WELLBUTRIN SR) 150 MG 12 hr tablet Take 1 tablet (150 mg total) by mouth Two (2) times a day. 180 tablet 1   ??? calcium carbonate/vitamin D3 (CALCIUM 500 + D ORAL) Take 1 tablet by mouth daily.     ??? carBAMazepine (CARBATROL) 200 MG 12 hr capsule Decrease carbamazepine to 200 mg in the morning, 200 mg lunchtime and 400 mg in the evening, for a week, then 200 mg three times a day. 90 capsule 2   ??? cholecalciferol, vitamin D3, 2,000 unit cap Take 4,000 Units by mouth daily.      ??? clobetasol (TEMOVATE) 0.05 % cream Apply 1 application topically Two (2) times a day.     ??? clonazePAM (KLONOPIN) 0.5 MG tablet Take 1 mg in the morning, 0.5 mg at lunchtime and 1 mg at bedtime. 150 tablet 2   ??? dalfampridine 10 mg Tb12 Take 1 tablet (10 mg total) by mouth two (2) times a day. 60 tablet 2   ??? DULoxetine (CYMBALTA) 20 MG capsule Take 1 capsule (20 mg total) by mouth daily. 90 capsule 1   ??? empty container Misc Use as directed to dispose of injectable medications 1 each 3   ??? gabapentin (NEURONTIN) 300 MG capsule TAKE 1 CAPSULE (300 MG TOTAL) BY MOUTH THREE (3) TIMES A DAY. 270 capsule 1   ??? Lactobacillus acidophilus (PROBIOTIC ORAL) Take 1 tablet by mouth daily.     ??? losartan (COZAAR) 25 MG tablet Take 1 tablet (25 mg total) by mouth daily. 90 tablet 3   ??? meloxicam (MOBIC) 15 MG tablet Take 1 tablet (15 mg total) by mouth daily. 90 tablet 1   ??? multivitamin (TAB-A-VITE/THERAGRAN) per tablet Take 1 tablet by mouth daily.     ??? NARCAN 4 mg/actuation nasal spray CALL 911. ADMINISTER A SINGLE SPRAY OF NARCAN IN ONE NOSTRIL. REPEAT EVERY 3 MINUTES AS NEEDED IF NO OR MINIMAL RESPONSE.  0   ??? omeprazole (PRILOSEC) 20 MG capsule Take 1 capsule (20 mg total) by mouth daily. 90 capsule 3   ??? peginterferon beta-1a (PLEGRIDY) 125 mcg/0.5 mL PnIj Inject the contents of 1 pen (125 mcg) under the skin every fourteen (14) days. Starting from Day 29 following titration. 1 mL 4   ??? peginterferon beta-1a (PLEGRIDY) 63 mcg/0.5 mL- 94 mcg/0.5 mL PnIj Inject the contents of 1 pen (63 mcg) under the skin on day 1. Then inject the contents of 1 pen (94 mcg) on day 15. 1 mL 0   ??? polyethylene glycol (GLYCOLAX) 17 gram/dose powder Take 17 g by mouth every other day.        No current facility-administered medications for this visit.         Changes to medications: Tanny Reports stopping the following medications: dalfampridine; Pt has decided to stop dalfampridine as she feels like it is making her balance worse and is not helping her breathing. I will route this to the  clinic so they are aware.    Allergies   Allergen Reactions   ??? Amantadine Nausea Only   ??? Codeine Itching       Changes to allergies: No    SPECIALTY MEDICATION ADHERENCE     Plegridy 125 mcg/0.82ml: 14 days of medicine on hand     Medication Adherence    Patient reported X missed doses in the last month:  0  Specialty Medication:  Plegridy  Patient is on additional specialty medications:  Yes  Additional Specialty Medications:  Ampyra  Patient Reported Additional Medication X Missed Doses in the Last Month:  >5  Patient is on more than two specialty medications:  No          Specialty medication(s) dose(s) confirmed: Regimen is correct and unchanged.     Are there any concerns with adherence? No    Adherence counseling provided? Not needed    CLINICAL MANAGEMENT AND INTERVENTION      Clinical Benefit Assessment:    Do you feel the medicine is effective or helping your condition? Patient believes the Plegridy is not used to help with symptoms but to stop the growth of new lesions    Clinical Benefit counseling provided? Not needed    Adverse Effects Assessment:    Are you experiencing any side effects? Yes, patient reports experiencing flu-like symptoms. Side effect counseling provided: APAP before injection and every 6 hours as needed     Are you experiencing difficulty administering your medicine? No    Quality of Life Assessment:    How many days over the past month did your MS  keep you from your normal activities? For example, brushing your teeth or getting up in the morning. Patient declined to answer    Have you discussed this with your provider? Not needed    Therapy Appropriateness:    Is therapy appropriate? Yes, therapy is appropriate and should be continued    DISEASE/MEDICATION-SPECIFIC INFORMATION      For patients on injectable medications: Patient currently has 1 doses left.  Next injection is scheduled for 11/14/18.    PATIENT SPECIFIC NEEDS     ? Does the patient have any physical, cognitive, or cultural barriers? No    ? Is the patient high risk? No     ? Does the patient require a Care Management Plan? No     ? Does the patient require physician intervention or other additional services (i.e. nutrition, smoking cessation, social work)? No      SHIPPING     Specialty Medication(s) to be Shipped:   Neurology: Plegridy    Other medication(s) to be shipped: none     Changes to insurance: No    Delivery Scheduled: Yes, Expected medication delivery date: 11/20/18.     Medication will be delivered via Same Day Courier to the confirmed home address in [facility].    The patient will receive a drug information handout for each medication shipped and additional FDA Medication Guides as required.  Verified that patient has previously received a Conservation officer, historic buildings.    Arnold Long   [facility] Specialty Pharmacist

## 2018-11-14 NOTE — Unmapped (Signed)
Patient stopped taking Ampyra two days ago. She stated it made her balance worse and she was feeling tired. She said it caused her to have low energy and was not a benefit to her breathing. She stated it was prescribed to help her lung muscles. Patient took it for two months.

## 2018-11-14 NOTE — Unmapped (Signed)
Thanks, I removed if from the med list.  Ashlee Christian

## 2018-11-14 NOTE — Unmapped (Signed)
Thanks.   Ashlee Christian

## 2018-11-20 ENCOUNTER — Institutional Professional Consult (permissible substitution): Admit: 2018-11-20 | Discharge: 2018-11-21 | Payer: MEDICARE | Attending: Family Medicine | Primary: Family Medicine

## 2018-11-20 DIAGNOSIS — F4321 Adjustment disorder with depressed mood: Secondary | ICD-10-CM

## 2018-11-20 DIAGNOSIS — G35 Multiple sclerosis: Secondary | ICD-10-CM

## 2018-11-20 DIAGNOSIS — R0602 Shortness of breath: Secondary | ICD-10-CM

## 2018-11-20 DIAGNOSIS — F320 Major depressive disorder, single episode, mild: Principal | ICD-10-CM

## 2018-11-20 MED FILL — PLEGRIDY 125 MCG/0.5 ML SUBCUTANEOUS PEN INJECTOR: 28 days supply | Qty: 1 | Fill #1 | Status: AC

## 2018-11-20 MED FILL — PLEGRIDY 125 MCG/0.5 ML SUBCUTANEOUS PEN INJECTOR: SUBCUTANEOUS | 28 days supply | Qty: 1 | Fill #1

## 2018-11-25 MED ORDER — DULOXETINE 20 MG CAPSULE,DELAYED RELEASE
ORAL_CAPSULE | Freq: Two times a day (BID) | ORAL | 1 refills | 0 days | Status: CP
Start: 2018-11-25 — End: 2018-12-25

## 2018-11-25 NOTE — Unmapped (Signed)
Patient reports she has been having itching at the center of her neck and some times the right side of her neck for the past two weeks. Sometimes she has an itch on her right hand. She tries not to scratch. She said there is no rash in these areas. She stated the intensity has been increasing. She has cut off her fingernails to prevent herself from itching.  She does not have a rash.  She stated she started Dalfampridine at the same time.   She is not taking any medication for itching right now.  She has not left the house in over two weeks.  She she does not have an infection.  She has not been exposed to any hot showers or bath.  She has been under stress due to a son who she reports is ill and family living in [place].  I have advised that she seek out things that have helped her cope with stress in the past. She stated she paints and watches TV and has been doing so. She is aware that these stressors can cause symptoms like this.  I told her that I will forward the information to her provider for advise.

## 2018-11-25 NOTE — Unmapped (Signed)
Assessment:        1. 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 Situational depression    3. Multiple sclerosis (CMS-HCC)    4. Shortness of breath            Plan:        Patient instructions:   Continue your present medicines.    Continue your Al-Anon, meditation, and walking    Follow up in early August for wellness visit or sooner for problems.    The telephone visit lasted 18 minutes.     Barriers to goals identified and addressed. Pertinent handouts were given today and reviewed with the patient as indicated.  The Care Plan and Self-Management goals have been included on the AVS and the AVS has been printed. Any outside resources or referrals needed at this time are noted above.  Any new medications prescribed have been discussed, and side effects have been addressed. Have assessed the patient's understanding, response, and barriers to adherence to medications. Patient voiced understanding and all questions have been answered to satisfaction.        Subjective:     Ashlee Christian is a 68 y.o. female.  This patient visit was completed through the use of an audio/video or telephone encounter.      This patient encounter is appropriate and reasonable under the circumstances given the patient's particular presentation at this time. The patient has been advised of the potential risks and limitations of this mode of treatment (including, but not limited to, the absence of in-person examination) and has agreed to be treated in a remote fashion in spite of them. Any and all of the patient's/patient's family's questions on this issue have been answered.     The patient has also been advised to contact this office for worsening conditions or problems, and seek emergency medical treatment and/or call 911 if the patient deems either necessary.    HPI:  This is a telephone follow up for depression.  Since last seen she has increased the Cymbalta to 20mg  bid for the past week and she feels he mood is definitely brighter.  Tolerating it without SE.  She continues to attend alanon meetings, now by video. She is walking regularly and meditating all of which help.  She continues to have SOB and MS hugs but she feels she is managing it better on the higher dosage of cymbalta.   PHQ-9: Over the last 2 weeks, how often have you been bothered by any of the following problems?  Reported by: Patient  Little interest or pleasure in doing things: Several days  Feeling down, depressed, or hopeless; (age 52-17) Feeling down, depressed, irritable, or hopeless: Not at all  Trouble falling or staying asleep, or sleeping too much: Not at all  Feeling tired or having little energy: Several days  Poor appetite or overeating; (age 15-17) Poor appetite, weight loss or overeating: Not at all  Feeling bad about yourself - or that you are a failure or have let yourself or your family down; (age 43-17) Feeling bad about yourself - or feeling that you are a failure, or that you have let yourself or your family down: Not at all  Trouble concentrating on things, such as reading the newspaper or watching television; (age 52-17) Trouble concentrating on things like schoolwork, reading or watching TV: Not at all  Moving or speaking so slowly that other people could have noticed. Or the opposite -  being so fidgety or restless that you have been moving around a lot more than usual: Not at all  Thoughts that you would be better off dead, or of hurting yourself in some way: Not at all  PHQ-9 TOTAL SCORE: 2  PHQ-9 Total Score Depression Severity:  PHQ-9 Total Score Depression Severity:: None-Minimal     The following portions of the patient's history were reviewed and updated as appropriate: allergies, current medications, past family history, past medical history, past social history, past surgical history and problem list.    Review of Systems  Pertinent items are noted in HPI.

## 2018-12-11 NOTE — Unmapped (Signed)
[facility] Refill Coordination Note    Specialty Medication(s) to be Shipped:   Neurology: Plegridy    Other medication(s) to be shipped: N/A     Ashlee Christian, DOB: Dec 11, 1950  Phone: [phone] (home)       All above HIPAA information was verified with patient.     Completed refill call assessment today to schedule patient's medication shipment from the [facility] [phone]).       Specialty medication(s) and dose(s) confirmed: Regimen is correct and unchanged.   Changes to medications: Ashlee Christian reports no changes reported at this time.  Changes to insurance: No  Questions for the pharmacist: No    Confirmed patient received Welcome Packet with first shipment. The patient will receive a drug information handout for each medication shipped and additional FDA Medication Guides as required.       DISEASE/MEDICATION-SPECIFIC INFORMATION        N/A    SPECIALTY MEDICATION ADHERENCE     Medication Adherence    Patient reported X missed doses in the last month:  0  Specialty Medication:  plegridy 125mg   Patient is on additional specialty medications:  No  Patient is on more than two specialty medications:  No  Any gaps in refill history greater than 2 weeks in the last 3 months:  no                plegridy 125 mg: 1 day of medicine on hand         SHIPPING     Shipping address confirmed in [employer].     Delivery Scheduled: Yes, Expected medication delivery date: 12/17/2018.     Medication will be delivered via Same Day Courier to the home address in [employer] [place].    Ashlee Christian Ashlee Christian   [facility] Specialty Technician

## 2018-12-11 NOTE — Unmapped (Signed)
[facility]  Multiple Sclerosis / Neuroimmunology Division  Herbert Aguinaldo Julieanne Cotton [facility]  Physician Assistant    Phone: [phone]  Fax: [phone]      Patient Name: Ashlee Christian   Date of Birth: 11-06-1950  Medical Record Number: [id number]  [address]  [place] [address]     Direct entry by:  Cy Blamer, PA-C.  Supervising Physician: Dr. Desma Mcgregor.    DATE OF VISIT: December 11, 2018    REASON FOR PATIENT OUTREACH / PHONE CALL ENCOUNTER: Follow up for evaluation of Multiple Sclerosis.  Last seen in clinic by Dr. Johnnye Lana 09/30/2018.    Start time 1002 , End time 1042 .  Patient location: Home.    I spent 40 minutes on the phone with the patient. I spent an additional 10 minutes on pre- and post-visit activities.     The patient was physically located in [place] or a state in which I am permitted to provide care. The patient understood that s/he may incur co-pays and cost sharing, and agreed to the telemedicine visit. The visit was completed via phone and/or video, which was appropriate and reasonable under the circumstances given the patient's presentation at the time.    The patient has been advised of the potential risks and limitations of this mode of treatment (including, but not limited to, the absence of in-person examination) and has agreed to be treated using telemedicine. The patient's/patient's family's questions regarding telemedicine have been answered.     If the phone/video visit was completed in an ambulatory setting, the patient has also been advised to contact their provider???s office for worsening conditions, and seek emergency medical treatment and/or call 911 if the patient deems either necessary.    ASSESSMENT AND PLAN:   ** Secondary Progressive Multiple Sclerosis:   -Interim visit for side effects from Plegridy. Patient is having flu like symptoms that are lasting 5 days even with taking Meloxicam and pruritis of the scalp that started with the first injection of Plegridy. A decision has been reached to hold Plegridy and monitor.    -Plan for a phone follow up in two weeks to discuss long term DMT plan.    INTERVAL HISTORY / CHIEF COMPLAINT:  Reporting  flu like symptoms with Plegridy since the first injection which was about  2.5 months ago. Described as aching all over, tired, stays in bed for 2 days and symptoms lasting  for 5 days.  Takes Plegridy in the evening. Tried tylenol  tylenol but no change in symptoms. Also on Mobic 15mg  once a day for hip pain which she was taking one hour before each injection.    Also having Itching of scalp that begin with the start of the Plegridy that is getting worse with each injection.    Started Ampyra at the same time but it made her feel more off balanced. Patient discontinue and balance improved.    PRIOR HISTORY:  A 68 y.o. Caucasian female who was diagnosed with multiple sclerosis in 09/2007.     The disease evolution is since her age of 68,  the disease follows a slowly secondary-progressive course.   She was treated with Copaxone and switched to Tecfidera on 09/04/2017 due to disease activity.    Pulmonologist thinks her breathing problems are neurologic. She had two infections (RI and ear infection) over the last 1.5 months.   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  Discussed stopping DMD given her age vs switching to Plegridy and the patient decided to start with Plegridy at her last visit with Dr. Johnnye Lana 09/30/2018.    REVIEW OF SYSTEMS:  10 systems reviewed and otherwise negative except as noted in HPI.  Current Outpatient Medications   Medication Sig Dispense Refill   ??? baclofen (LIORESAL) 20 MG tablet TAKE 1 TABLET THREE TIMES DAILY 270 tablet 1   ??? BIOTIN ORAL Take 1 tablet by mouth once daily.     ??? buPROPion (WELLBUTRIN SR) 150 MG 12 hr tablet Take 1 tablet (150 mg total) by mouth Two (2) times a day. 180 tablet 1   ??? calcium carbonate/vitamin D3 (CALCIUM 500 + D ORAL) Take 1 tablet by mouth daily.     ??? carBAMazepine (CARBATROL) 200 MG 12 hr capsule Decrease carbamazepine to 200 mg in the morning, 200 mg lunchtime and 400 mg in the evening, for a week, then 200 mg three times a day. (Patient taking differently: Take 200 mg by mouth Three (3) times a day. ) 90 capsule 2   ??? cholecalciferol, vitamin D3, 2,000 unit cap Take 4,000 Units by mouth daily.      ??? clobetasol (TEMOVATE) 0.05 % cream Apply 1 application topically Two (2) times a day.     ??? clonazePAM (KLONOPIN) 0.5 MG tablet Take 1 mg in the morning, 0.5 mg at lunchtime and 1 mg at bedtime. 150 tablet 2   ??? DULoxetine (CYMBALTA) 20 MG capsule Take 1 capsule (20 mg total) by mouth Two (2) times a day. 180 capsule 1   ??? empty container Misc Use as directed to dispose of injectable medications 1 each 3   ??? gabapentin (NEURONTIN) 300 MG capsule TAKE 1 CAPSULE (300 MG TOTAL) BY MOUTH THREE (3) TIMES A DAY. 270 capsule 1   ??? Lactobacillus acidophilus (PROBIOTIC ORAL) Take 1 tablet by mouth daily.     ??? losartan (COZAAR) 25 MG tablet Take 1 tablet (25 mg total) by mouth daily. 90 tablet 3   ??? meloxicam (MOBIC) 15 MG tablet Take 1 tablet (15 mg total) by mouth daily. 90 tablet 1   ??? multivitamin (TAB-A-VITE/THERAGRAN) per tablet Take 1 tablet by mouth daily.     ??? NARCAN 4 mg/actuation nasal spray CALL 911. ADMINISTER A SINGLE SPRAY OF NARCAN IN ONE NOSTRIL. REPEAT EVERY 3 MINUTES AS NEEDED IF NO OR MINIMAL RESPONSE.  0   ??? omeprazole (PRILOSEC) 20 MG capsule Take 1 capsule (20 mg total) by mouth daily. 90 capsule 3   ??? peginterferon beta-1a (PLEGRIDY) 125 mcg/0.5 mL PnIj Inject the contents of 1 pen (125 mcg) under the skin every fourteen (14) days. Starting from Day 29 following titration. 1 mL 4   ??? polyethylene glycol (GLYCOLAX) 17 gram/dose powder Take 17 g by mouth every other day.        No current facility-administered medications for this visit. Patient Active Problem List   Diagnosis   ??? Multiple sclerosis (CMS-HCC)   ??? Binocular vision disorder with diplopia   ??? Spinal stenosis of cervical region   ??? Hypertension   ??? Cognitive complaints   ??? Neuropathic pain   ??? Bilateral occipital neuralgia   ??? Depression   ??? Hx of fusion of cervical spine   ??? Hesitancy of micturition   ??? Anxiety   ??? Hyperinflation of lungs   ??? Cough   ??? Insomnia   ??? Leucopenia   ??? Low sodium levels   ???  Cognitive change   ??? Other chest pain   ??? Chronic fatigue       Allergies   Allergen Reactions   ??? Amantadine Nausea Only   ??? Codeine Itching

## 2018-12-11 NOTE — Unmapped (Signed)
Discontinue Plegridy and follow up in 2 weeks for a phone visit.    CoVid-19:     Please follow CDC and National MS society guidelines.    In the event of fever, cough, shortness of breath or other respiratory symptoms call your Primary Care Provider.    In the event that you are unable to reach your PCP or do not have one call the [facility] at [phone].         ??   In case of:  ?? a suspected relapse (new symptoms or worsening existing symptoms, lasting for >24h)  OR  ?? a need for an additional appointment for other reasons     Please contact:    [facility] Neurology Call Center  Phone: [phone]   Fax: [phone]       [facility], Department of Neurology /  Multiple Sclerosis Division    [address]    [place], [place] [address]

## 2018-12-17 MED ORDER — BACLOFEN 20 MG TABLET
ORAL_TABLET | 1 refills | 0 days | Status: CP
Start: 2018-12-17 — End: ?

## 2018-12-17 MED FILL — PLEGRIDY 125 MCG/0.5 ML SUBCUTANEOUS PEN INJECTOR: 28 days supply | Qty: 1 | Fill #2 | Status: AC

## 2018-12-17 MED FILL — PLEGRIDY 125 MCG/0.5 ML SUBCUTANEOUS PEN INJECTOR: SUBCUTANEOUS | 28 days supply | Qty: 1 | Fill #2

## 2018-12-17 NOTE — Unmapped (Signed)
Last visit 09/21/2018  Next visit 12/25/2018

## 2018-12-25 ENCOUNTER — Institutional Professional Consult (permissible substitution)
Admit: 2018-12-25 | Discharge: 2018-12-26 | Payer: MEDICARE | Attending: Physician Assistant | Primary: Physician Assistant

## 2018-12-25 DIAGNOSIS — G35 Multiple sclerosis: Secondary | ICD-10-CM

## 2018-12-25 DIAGNOSIS — M792 Neuralgia and neuritis, unspecified: Principal | ICD-10-CM

## 2018-12-25 MED ORDER — GABAPENTIN 300 MG CAPSULE
ORAL_CAPSULE | Freq: Three times a day (TID) | ORAL | 0 refills | 0 days | Status: CP
Start: 2018-12-25 — End: 2019-04-14

## 2018-12-25 NOTE — Unmapped (Signed)
Increase Gabapentin to 600mg  (two capsules) Three time a day.  Sen me a My chart message in about two weeks and let me know how this is working for you.    Plan to follow up with me in 3 months.    CoVid-19:     Please follow CDC and National MS society guidelines.    In the event of fever, cough, shortness of breath, other respiratory symptoms , Gi symptoms or loss of smell call your Primary Care Provider.    In the event that you are unable to reach your PCP or do not have one call the [facility] at [phone].         ??   In case of:  ?? a suspected relapse (new symptoms or worsening existing symptoms, lasting for >24h)  OR  ?? a need for an additional appointment for other reasons     Please contact:    [facility] Neurology Call Center  Phone: [phone]   Fax: [phone]       [facility], Department of Neurology /  Multiple Sclerosis Division    [address]    [place], [place] [address]

## 2018-12-25 NOTE — Unmapped (Addendum)
[facility] [place] [facility] at [facility]  Multiple Sclerosis / Neuroimmunology Division  Shalin Linders [facility]  Physician Assistant    Phone: [phone]  Fax: [phone]      Patient Name: Ashlee Christian   Date of Birth: 1950-08-23  Medical Record Number: [id number]  [address]  [place] [address]     Direct entry by:  Cy Blamer, PA-C.  Supervising Physician: Dr. Desma Mcgregor.    DATE OF VISIT: Dec 25, 2018    REASON FOR PATIENT OUTREACH /  video CALL ENCOUNTER: Follow up for evaluation of Multiple Sclerosis.  Last seen by phone visit by [facility] 12/11/2018.    Patient location: Home.  Start time 1007 , End time 1048 , Total visit time (includes pre and post visit activities) 51 minutes.    I spent on the audio/video with the patient. I spent an additional 10 minutes on pre- and post-visit activities.     The patient was physically located in [place] or a state in which I am permitted to provide care. The patient and/or parent/gauardian understood that s/he may incur co-pays and cost sharing, and agreed to the telemedicine visit. The visit was completed via phone and/or video, which was appropriate and reasonable under the circumstances given the patient's presentation at the time.    The patient and/or parent/guardian has been advised of the potential risks and limitations of this mode of treatment (including, but not limited to, the absence of in-person examination) and has agreed to be treated using telemedicine. The patient's/patient's family's questions regarding telemedicine have been answered.     If the phone/video visit was completed in an ambulatory setting, the patient and/or parent/guardian has also been advised to contact their provider???s office for worsening conditions, and seek emergency medical treatment and/or call 911 if the patient deems either necessary.     Evaluated by Dr. Johnnye Lana 09/30/2018.      ASSESSMENT AND PLAN:   ** Secondary Progressive Multiple Sclerosis:   - Plegridy discontinued 12/11/2018 due to flu like symptoms which have resolved now. Continue off of DMT given her age and side effects to DMT.  -Plan for MRI's for October 2020.    **Sensory symptoms:   Increase Gabapentin to 600mg  TID along with Carbamazepine 200mg  BID.   Not taking Cymbalta due to it causing balance problems.  **Pysical exam performed via video.    -Plan for a phone follow up in 3 months.    Denies travel within the past 14 days, cough, SOB, fever, lose of smell, nausea,vomiting or diarrhea. Has not been in contact with anyone who has tested positive for COVID19.    Advised to follow CDC and [employer] guidelines.    In the event of respiratory symptoms, fever, cough, shortness of breath , GI symptoms or loss of smell  call PCP first and if unable to reach the PCP or do not have the PCP then call [facility] at [phone].     INTERVAL HISTORY / CHIEF COMPLAINT:  Plegridy discontinued 12/11/2018. Flu like symptoms have resolved. Fatigue improved. Itching scalp improved but still occurs daily but has periods of no itching.    She was having flu like symptoms with Plegridy since the first injection Described as aching all over, tired, stays in bed for 2 days and symptoms lasting  for 5 days.  Also having Itching of scalp that begin with the start of the Plegridy  that was getting worse with each injection.    Started Ampyra at the same time but it made her feel more off balanced. Patient discontinue and balance improved.    PRIOR HISTORY:  A 68 y.o. Caucasian female who was diagnosed with multiple sclerosis in 09/2007.     The disease evolution is since her age of 13,  the disease follows a slowly secondary-progressive course.   She was treated with Copaxone and switched to Tecfidera on 09/04/2017 due to disease activity.Stopped 09/2018 due to Lymphocytopenia.  Plegridy started 09/2018 - 11/2018.    Pulmonologist thinks her breathing problems are neurologic. She had two infections (RI and ear infection) over the last 1.5 months.   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  Discussed stopping DMD given her age vs switching to Plegridy and the patient decided to start with Plegridy at her last visit with Dr. Johnnye Lana 09/30/2018.    REVIEW OF SYSTEMS:  10 systems reviewed and otherwise negative except as noted in HPI.  Current Outpatient Medications   Medication Sig Dispense Refill   ??? baclofen (LIORESAL) 20 MG tablet TAKE 1 TABLET THREE TIMES DAILY 270 tablet 1   ??? BIOTIN ORAL Take 1 tablet by mouth once daily.     ??? buPROPion (WELLBUTRIN SR) 150 MG 12 hr tablet Take 1 tablet (150 mg total) by mouth Two (2) times a day. 180 tablet 1   ??? calcium carbonate/vitamin D3 (CALCIUM 500 + D ORAL) Take 1 tablet by mouth daily.     ??? carBAMazepine (CARBATROL) 200 MG 12 hr capsule Decrease carbamazepine to 200 mg in the morning, 200 mg lunchtime and 400 mg in the evening, for a week, then 200 mg three times a day. (Patient taking differently: Take 200 mg by mouth Three (3) times a day. ) 90 capsule 2   ??? cholecalciferol, vitamin D3, 2,000 unit cap Take 4,000 Units by mouth daily.      ??? clobetasol (TEMOVATE) 0.05 % cream Apply 1 application topically Two (2) times a day.     ??? clonazePAM (KLONOPIN) 0.5 MG tablet Take 1 mg in the morning, 0.5 mg at lunchtime and 1 mg at bedtime. 150 tablet 2   ??? DULoxetine (CYMBALTA) 20 MG capsule Take 1 capsule (20 mg total) by mouth Two (2) times a day. 180 capsule 1   ??? empty container Misc Use as directed to dispose of injectable medications 1 each 3   ??? gabapentin (NEURONTIN) 300 MG capsule TAKE 1 CAPSULE (300 MG TOTAL) BY MOUTH THREE (3) TIMES A DAY. 270 capsule 1   ??? Lactobacillus acidophilus (PROBIOTIC ORAL) Take 1 tablet by mouth daily.     ??? losartan (COZAAR) 25 MG tablet Take 1 tablet (25 mg total) by mouth daily. 90 tablet 3   ??? meloxicam (MOBIC) 15 MG tablet Take 1 tablet (15 mg total) by mouth daily. 90 tablet 1   ??? multivitamin (TAB-A-VITE/THERAGRAN) per tablet Take 1 tablet by mouth daily.     ??? NARCAN 4 mg/actuation nasal spray CALL 911. ADMINISTER A SINGLE SPRAY OF NARCAN IN ONE NOSTRIL. REPEAT EVERY 3 MINUTES AS NEEDED IF NO OR MINIMAL RESPONSE.  0   ??? omeprazole (PRILOSEC) 20 MG capsule Take 1 capsule (20 mg total) by mouth daily. 90 capsule 3   ??? peginterferon beta-1a (PLEGRIDY) 125 mcg/0.5 mL PnIj Inject the contents of 1 pen (125 mcg) under the skin every fourteen (14) days. Starting from Day 29 following titration. 1 mL  4   ??? polyethylene glycol (GLYCOLAX) 17 gram/dose powder Take 17 g by mouth every other day.        No current facility-administered medications for this visit.        Patient Active Problem List   Diagnosis   ??? Multiple sclerosis (CMS-HCC)   ??? Binocular vision disorder with diplopia   ??? Spinal stenosis of cervical region   ??? Hypertension   ??? Cognitive complaints   ??? Neuropathic pain   ??? Bilateral occipital neuralgia   ??? Depression   ??? Hx of fusion of cervical spine   ??? Hesitancy of micturition   ??? Anxiety   ??? Hyperinflation of lungs   ??? Cough   ??? Insomnia   ??? Leucopenia   ??? Low sodium levels   ??? Cognitive change   ??? Other chest pain   ??? Chronic fatigue       Allergies   Allergen Reactions   ??? Amantadine Nausea Only   ??? Codeine Itching     VIDEO EXAM:  General Appearance: In no acute distress:    Alert and oriented to person, place, time and situation. Wears glasses.    Neurological Examination:   Cranial Nerves:   III, IV, VI- extra ocular movements are intact, No ptosis, no nystagmus.  VII- face symmetrical, no facial droop, normal facial movements with smile/grimace  VIII- Hearing grossly intact.  XII- Tongue protrudes midline, full range of movements of the tongue.  Full shoulder shrug.      Sensory UEs LEs    R L R L   Light touch WNL WNL Decreased per patient report. WNL     Cerebellar/Coordination:  Rapid alternating movements, finger-to-nose and heel-to-shin  bilaterally demonstrates no abnormalities.    Gait: Per patient report walking 5 feet, right leg weaker and slight drag which is her normal. Unable to walk on heels, toes or tandem gait. Rhomberg with holding on to the door frame appears normal.

## 2019-01-03 NOTE — Unmapped (Signed)
Patient states that she has severe right hip pain from the groin radiating laterally down the thigh to the knee.  She feels that this may be coming from her back and has been unrelieved for the past month. She had a Lumbar MRI and R-hip XR in the past year. She has underlying MS and has been on several high dose steroid courses to treat her MS flares. She is reluctant to come in to the clinic due to Covid concerns. She will call back to schedule an appointment. Suggested that she see Duke Salvia, NP for work up.

## 2019-01-07 NOTE — Unmapped (Signed)
Patient no longer on the medication due to side effects.

## 2019-01-08 NOTE — Unmapped (Deleted)
ORTHOPAEDIC SPINE CLINIC NOTE       Bertram Millard. Lorin Picket, NP  Nurse Practitioner  [email or web address]  [email or web address]  [phone]        Patient Name:Ashlee Christian  MRN: [id number]  DOB: 1951/01/03    Date: 01/08/2019    PCP: Garnette Czech, MD    ASSESSMENT:    No diagnosis found.    Ashlee Christian is a 68 y.o. female with ***     PLAN and RECOMMENDATIONS:      ?? {mlplanmaster:25063}    ?? No follow-ups on file.     SUBJECTIVE:     Chief Complaint:  No chief complaint on file.      History of Present Illness:   Ashlee Christian is a 68 y.o. {Left adnexa:54592} handed female with a PMH of *** seen {mlvisittype:23847} for evaluation of {mlcc:23826}.    Last seen by April Wikstrom 07/2018  Ashlee Christian is a 68 y.o. female seen at the [facility] for evaluation of their chief complaint of postoperative follow-up and follow-up of low back and radicular pain.  Ms. Vonita Moss underwent a C4-7 ACDF with Dr. Cameron Ali on 03/04/2017.  Her last office visit with Dr. Cameron Ali was 12/24/2017.  At that visit they discussed the complexity of her neurologic symptoms postoperatively given her MS.  An EMG was ordered to further evaluate for possible carpal tunnel due to her right hand numbness.  A lumbar MRI was ordered due to her right lower extremity radicular symptoms.  She did not return to follow-up with Dr. Cameron Ali after the studies.  She reports that the right lower extremity radicular symptoms she was experiencing at that time have resolved.  She reports for short period of time the pain moved to her left leg but that has since resolved.  She is only noting mild axial low back pain.  She continues to note numbness and paresthesia of her right hand.  EMG was obtained in July and was negative for carpal tunnel.  She reports that she was recently started on Cymbalta and continues to take meloxicam which seems to be helping significantly with her symptoms.  She reports no neck pain and no radicular pain into the upper extremity.      There were no vitals filed for this visit.  Date of onset:   Location: axial and cervical   Modifying factors: worse with standing, worse with walking and worse with activity  Associated symptoms: {mlassocsxcerv:30309}    {smsfunctionallimitations:66169}    Current Treatments:  Prior treatments: {mlpriortreatment:23846}     Medical History   She  has a past medical history of Diverticulitis large intestine (2000), Hypertension (2012), MS (multiple sclerosis) (CMS-HCC) (2009), and Osteoporosis.     Surgical History   She  has a past surgical history that includes Tonsillectomy (1964); Colectomy (2001); Cataract extraction (Bilateral, 2016); Eye surgery (Left, 2012); pr remv vert body,cerv,one sgmt ([facility], 03/04/2017); pr arthrodesis ant interbody inc discectomy, cervical below c2 ([facility], 03/04/2017); pr anterior instrumentation 4-7 vertebral segments ([facility], 03/04/2017); pr insj biomchn dev intervertebral dsc spc w/arthrd ([facility], 03/04/2017); pr insj biomchn dev vrt corpectomy defect w/arthrd ([facility], 03/04/2017); pr allograft for spine surgery only morselized ([facility], 03/04/2017); pr autograft spine surgery local from same incision ([facility], 03/04/2017); pr arthrodesis ant interbody min discectomy, cervical below c2 (03/04/2017); pr arthrodesis ant interbody min discectomy,ea addl (03/04/2017); Total abdominal hysterectomy w/ bilateral salpingoophorectomy (2000); and Hysterectomy.     Allergies   Amantadine and Codeine   Medications  She has a current medication list which includes the following prescription(s): baclofen, biotin, bupropion, calcium carbonate/vitamin d3, carbamazepine, cholecalciferol (vitamin d3), clobetasol, clonazepam, empty container, gabapentin, lactobacillus acidophilus, losartan, meloxicam, multivitamin, narcan, omeprazole, and polyethylene glycol.   Review of Systems {mlros:27469::A 10-system review was performed by questionnaire and noted in the electronic chart.  Positives noted/discussed.  Balance of systems was negative.}    Fever/chills :{mldenies:23841::denies}  Bowel/bladder symptoms :{mldenies:23841::denies}   Family History Her family history includes Alcohol abuse in her paternal uncle; Cancer in her maternal grandfather; Depression in her mother; Hyperlipidemia in her father, maternal grandfather, maternal grandmother, mother, paternal grandfather, and paternal grandmother; Hypertension in her father, maternal grandfather, maternal grandmother, mother, and sister; No Known Problems in her brother, maternal aunt, maternal uncle, and paternal aunt; Stroke in her father and maternal grandmother.     Social History She  reports that she quit smoking about 2 years ago. Her smoking use included cigarettes. She has never used smokeless tobacco. She reports that she does not drink alcohol or use drugs.        Occupational History   ??? Not on file      {mlreview:26904::The history recorded in the table above was reviewed.}    OBJECTIVE:     PHYSICAL EXAM:  Vitals: There were no vitals taken for this visit.  Appearance: {General Appearance:24305::well-nourished,no acute distress}   Skin: {mlskin:23894::No cyanosis or clubbing in bilat hands.,No edema in the feet.}  Pulses: {mlpulse:23912::2+ DP pulses bilaterally}     Neurologic exam:   Mental Status: Ashlee Christian is oriented to {EXAM; ORIENTATION:18546::time,place,person} & is {Mood and Affect:24307::alert,cooperative}  Cranial Nerves: II: Visual acuity is deferred. Visual fields are grossly full to confrontation.    III, IV, VI: Extra-ocular movements are full. Pupils equal, round and reactive to light.   V: Facial sensation is normal.   VII: Facial strength is normal and symmetric.   VIII: Hearing grossly intact bilaterally.   IX: Palate is midline and elevates symmetrically.   XI: Shoulder shrug symmetric.   XII: Tongue is midline and without atrophy. Motor: Normal bulk and tone without evidence of pronator drift or fasciculations. No abnormal movements.    Motor R L  Reflexes R L   Deltoid ***5 5  Tricep {mlreflex:23898::1-2+} {mlreflex:23898::1-2+}   Bicep 5 5  Bicep {mlreflex:23898::1-2+} {mlreflex:23898::1-2+}   Tricep 5 5  Brachiorad {mlreflex:23898::1-2+} {mlreflex:23898::1-2+}   WE 5 5       Grip 5 5  Patellar {mlreflex:23898::1-2+} {mlreflex:23898::1-2+}   IO 5 5  Achilles {mlreflex:23898::1-2+} {mlreflex:23898::1-2+}   IP 5 5       Quad 5 5  Pathologic R L   TA 5 5  Hoffmann's {mlpathrfx:23901::neg.} {mlpathrfx:23901::neg.}   EHL 5 5  Babinski {mlpathrfx:23901::neg.} {mlpathrfx:23901::neg.}   GS 5 5  Clonus {mlpathrfx:23901::neg.} {mlpathrfx:23901::neg.}      Sensory:   Sensation to light touch intact throughout.  Sensation to pinprick and temperature (spinothalamic tracts) is intact in upper and lower extremities.  Sensation to proprioception and vibration (dorsal columns) is intact in upper and lower extremities.    Cerebellum/Coordination: Finger-nose-finger is normal bilaterally. Rapid alternating movements and finger taps normal.  Romberg is negative.  Gait: {mlgait:23875}  Straight Line Tandem Gait: {mlslg:23876::normal coordinated tandem gait}    Cervical Spine Exam:  Inspection: {ORT Inspection:24299::No swelling, erythema, deformity, atrophy or hypertrophy noted}  Cervical Spine ROM: {mlrom:23878::normal}  Cervical Lymphadenopathy: {mllad:23880::none}  Spine Palpation: {mltender:23879::non-tender to palpation,normal skin}    Cord/Nerve Tension Signs: {mlnervetension:23915}    Right Shoulder: {  mlshoulder:23881::Negative impingement.,No atrophy noted upon inspection.,Normal elevation ROM and stability.,Normal skin.}  Left Shoulder: {mlshoulder:23881::Negative impingement.,No atrophy noted upon inspection.,Normal elevation ROM and stability.,Normal skin.}    Right Hand: {mlhand:23882::No atrophy noted upon inspection.,Normal ROM and stability.,Normal skin.}  Left Hand: {mlhand:23882::No atrophy noted upon inspection.,Normal ROM and stability.,Normal skin.}    Right Hip/Knee:   {mlhip:26051::No pain with hip internal rotation/loading.,Normal internal rotation ROM.}   {mlknee:26052::No knee pain with flexion/extension.,No atrophy noted upon inspection.,Normal skin.,Normal ROM and stability.}  Left Hip/Knee:   {mlhip:26051::No pain with hip internal rotation/loading.,Normal internal rotation ROM.}   {mlknee:26052::No knee pain with flexion/extension.,No atrophy noted upon inspection.,Normal skin.,Normal ROM and stability.}           MEDICAL DECISION MAKING    Test Results:  Imaging:   ?? {mlimagetype:27612} {smsimagelocation:66374::[place].} Date:{smsdate:66375::Today.}  Impression:    ?? {mlimagetype:27612} {smsimagelocation:66374::[place].} Date:{smsdate:66375::Today.}  Impression:      {smsimageinterp:65666::I, Duke Salvia, NP, personally interpreted the images.,Images were reviewed with the patient on the PACS monitor.,The available reports were reviewed.}    Discussion:  ?? {smscounseling:66376}     cc: Felicity Coyer, MD, Garnette Czech, MD

## 2019-01-09 ENCOUNTER — Ambulatory Visit
Admit: 2019-01-09 | Discharge: 2019-01-10 | Payer: MEDICARE | Attending: Rehabilitative and Restorative Service Providers" | Primary: Rehabilitative and Restorative Service Providers"

## 2019-01-09 NOTE — Unmapped (Addendum)
8:38am Attempted to contact patient via doximity video with no answer after waiting 5 minutes for patient to join the call.      Will attempted again later today unless patient calls to reschedule.      9:15am Addendum: Attempted to contact patient via doximity video a second time and there was no answer after waiting 5 minutes for the patient to join the call.

## 2019-01-16 ENCOUNTER — Telehealth
Admit: 2019-01-16 | Discharge: 2019-01-17 | Payer: MEDICARE | Attending: Rehabilitative and Restorative Service Providers" | Primary: Rehabilitative and Restorative Service Providers"

## 2019-01-16 DIAGNOSIS — M160 Bilateral primary osteoarthritis of hip: Principal | ICD-10-CM

## 2019-01-16 NOTE — Unmapped (Signed)
ORTHOPAEDIC TELEHEALTH NOTE  Date: 01/16/2019   [facility]    Primary Care Physician: Garnette Czech, MD  Established patient, new problem.    ASSESSMENT:  Ashlee Christian is a 68 y.o. female with the following visit diagnoses:    ICD-10-CM   1. 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 I spent an additional 15 minutes on pre- and post-visit activities.     The patient was physically located in [place] or a state in which I am permitted to provide care. The patient and/or parent/gauardian understood that s/he may incur co-pays and cost sharing, and agreed to the telemedicine visit. The visit was completed via phone and/or video, which was appropriate and reasonable under the circumstances given the patient's presentation at the time.    The patient and/or parent/guardian has been advised of the potential risks and limitations of this mode of treatment (including, but not limited to, the absence of in-person examination) and has agreed to be treated using telemedicine. The patient's/patient's family's questions regarding telemedicine have been answered.     If the phone/video visit was completed in an ambulatory setting, the patient and/or parent/guardian has also been advised to contact their provider???s office for worsening conditions, and seek emergency medical treatment and/or call 911 if the patient deems either necessary.      PLAN:    - We had a lengthy discussion about the possibility that her symptoms could be multi-factorial; arising from the hip and/or the lumbar spine. We will treat the hip arthritis first to see how much of her symptoms resolve. If minimal improvement, she will be referred back to the spine center for consideration of ESI treatment.   Currently, the patient does not have a mode of transportation to clinic due to the COVID-19 pandemic. She will contact the office to schedule an appointment for the recommended USGI once she is able to get transportation coordinated.  - Continue taking meloxicam, consider splitting 15mg  dose into two 7.5mg  doses per day  - Home exercise program provided  - Ice affected area for 15-20 minutes every hour as needed  - Heat affected area for 20-30 minutes as needed  - Refer to non-operative Sports Medicine for further evaluation/ultrasound guided injection for treatment of bilateral hip osteoarthritis    Requested Prescriptions      No prescriptions requested or ordered in this encounter      Scheduling Notes:  Planned Return if symptoms worsen or fail to improve.  SUBJECTIVE:  Chief complaint: Hip pain   History of Present Illness:   (>4: location, quality, severity, timing, duration, context, modifying factors, associated symptoms)  68 y.o. female who presents for evaluation of Bilateral Hip pain. Due to the current COVID-19 pandemic, we are limiting in-person clinic visits. The patient is agreeable to this and understands the limitations of telehealth.   Mechanism of Injury: none, date: 1 month ago, patient has a pertinent history of multiple sclerosis, diagnosed when she was 68 years old. Since that time, she has experienced variable severity of her right leg dragging.   She was seen by the Spine Center at the end of 2019 due to symptoms of radiating pain with numbness and tingling into the lower extremities and was diagnosed with lumbar radiculopathy. She has not had any formal physical therapy or injections for the radiculopathy.  One month ago, she began to develop severe groin pain, right worse  than left, along with the radiating pain, numbness, and tingling. Her right leg drag became worse with the onset of these symptoms.  She has not been able to schedule an in-person evaluation of her symptoms due to the COVID-19 pandemic. She had outside x-rays performed of both hips. The images are not available for review, but the reports states degenerative changes of both hips and the lumbar spine. The patient has a copy of the images on a disc in her possession.   She has been taking Mobic 15mg  daily, prescribed by her PCP, which provides less than 12 hours of relief, but does make her pain more tolerable.  Patient reports numbness/tingling distally into the right shin.         Review of Systems Pertinent positives and negatives are documented in the HPI. All other systems reviewed are negative.   Medical History Past Medical History:   Diagnosis Date   ??? Diverticulitis large intestine 2000    with abscess and perforation with peritonitis   ??? Hypertension 2012   ??? 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 Osteoporosis       Surgical History Past Surgical History:   Procedure Laterality Date   ??? CAT[place]ACT EXTRACTION Bilateral 2016    [place]   ??? COLECTOMY  2001    Partial   ??? EYE SURGERY Left 2012    Laser sx due to detached retina    ??? HYSTERECTOMY     ??? PR ALLOGRAFT FOR SPINE SURGERY ONLY MORSELIZED Midline 03/04/2017    Procedure: Allograft For Spine Surgery Only; Morselized;  Surgeon: Timothy Lasso, MD;  Location: MAIN OR [facility];  Service: Ortho Spine   ??? PR ANTERIOR INSTRUMENTATION 4-7 VERTEBRAL SEGMENTS Midline 03/04/2017    Procedure: Anterior Instrumentation; 4 To 7 Vertebral Segments;  Surgeon: Timothy Lasso, MD;  Location: MAIN OR [facility];  Service: Ortho Spine   ??? PR ARTHRODESIS ANT INTERBODY INC DISCECTOMY, CERVICAL BELOW C2 Midline 03/04/2017    Procedure: Arthrodes, Ant Intrbdy, Incl Disc Spc Prep, Discect, Osteophyt/Decompress Spinl Crd &/Or Nrv Rt, Crv Blo C2;  Surgeon: Timothy Lasso, MD;  Location: MAIN OR [facility];  Service: Ortho Spine   ??? PR ARTHRODESIS ANT INTERBODY MIN DISCECTOMY, CERVICAL BELOW C2  03/04/2017    Procedure: Arthrodesis, Anterior Interbody Technique, Include Minimal Diskectomy To Prep Interspace; Cervical Below C2;  Surgeon: Timothy Lasso, MD;  Location: MAIN OR [facility];  Service: Ortho Spine   ??? PR ARTHRODESIS ANT INTERBODY MIN DISCECTOMY,EA ADDL  03/04/2017    Procedure: Arthrodesis, Anterior Interbody Technique, W/Minimal Diskectomy To Prep Interspace; Each Add`L Interspace;  Surgeon: Timothy Lasso, MD;  Location: MAIN OR [facility];  Service: Ortho Spine   ??? PR AUTOGRAFT SPINE SURGERY LOCAL FROM SAME INCISION Midline 03/04/2017    Procedure: Autograft/Spine Surg Only (W/Harvest Graft); Local (Eg, Rib/Spinous Proc, Ples Specter) Obtain From Same Incis;  Surgeon: Timothy Lasso, MD;  Location: MAIN OR [facility];  Service: Ortho Spine   ??? PR INSJ BIOMCHN DEV INTERVERTEBRAL DSC SPC W/ARTHRD Midline 03/04/2017    Procedure: Insert Interbody Biomechanical Device(S) With Integral Anterior Instrument For Device Anchoring, When Performed, To Intervertebral Disc Space In Conjunction With Interbody Arthrodesis, Each Interspace;  Surgeon: Timothy Lasso, MD;  Location: MAIN OR [facility];  Service: Ortho Spine   ??? PR INSJ BIOMCHN DEV VRT CORPECTOMY DEFECT W/ARTHRD Midline 03/04/2017    Procedure: Insert Intervertebral Biomechanical Device(S) W Integral Anterior Instrument For Anchoring, When  Performed, To Vert Corpectomy(Ies) Defect, In Conjunction W Interbody Arthrodesis, Each Contig Defect;  Surgeon: Timothy Lasso, MD;  Location: MAIN OR [facility];  Service: Ortho Spine   ??? PR REMV VERT BODY,CERV,ONE SGMT Midline 03/04/2017    Procedure: Vertebral Corpectomy-Ant W/Decomp; Cerv 1 Segmt;  Surgeon: Timothy Lasso, MD;  Location: MAIN OR [facility];  Service: Ortho Spine   ??? TONSILLECTOMY  1964   ??? TOTAL ABDOMINAL HYSTERECTOMY W/ BILATERAL SALPINGOOPHORECTOMY  2000      Allergies Amantadine and Codeine   Medications She has a current medication list which includes the following prescription(s): baclofen, biotin, bupropion, calcium carbonate/vitamin d3, carbamazepine, cholecalciferol (vitamin d3), clobetasol, clonazepam, empty container, gabapentin, lactobacillus acidophilus, losartan, meloxicam, multivitamin, narcan, omeprazole, and polyethylene glycol.   Family History Her family history includes Alcohol abuse in her paternal uncle; Cancer in her maternal grandfather; Depression in her mother; Hyperlipidemia in her father, maternal grandfather, maternal grandmother, mother, paternal grandfather, and paternal grandmother; Hypertension in her father, maternal grandfather, maternal grandmother, mother, and sister; No Known Problems in her brother, maternal aunt, maternal uncle, and paternal aunt; Stroke in her father and maternal grandmother.   Social History She reports that she quit smoking about 2 years ago. Her smoking use included cigarettes. She has never used smokeless tobacco. She reports that she does not drink alcohol or use drugs.Home address:[address]  [place] [address]        Occupational History   ??? Not on file     Social History     Socioeconomic History   ??? Marital status: Divorced     Spouse name: Not on file   ??? Number of children: Not on file   ??? Years of education: Not on file   ??? Highest education level: Not on file   Occupational History   ??? Not on file   Social Needs   ??? Financial resource strain: Not on file   ??? Food insecurity     Worry: Not on file     Inability: Not on file   ??? Transportation needs     Medical: Not on file     Non-medical: Not on file   Tobacco Use   ??? Smoking status: Former Smoker     Types: Cigarettes     Last attempt to quit: 01/19/2016     Years since quitting: 2.9   ??? Smokeless tobacco: Never Used   Substance and Sexual Activity   ??? Alcohol use: No   ??? Drug use: No   ??? Sexual activity: Not Currently   Lifestyle   ??? Physical activity     Days per week: Not on file     Minutes per session: Not on file   ??? Stress: Not on file   Relationships   ??? Social Wellsite geologist on phone: Not on file     Gets together: Not on file     Attends religious service: Not on file     Active member of club or organization: Not on file     Attends meetings of clubs or organizations: Not on file Relationship status: Not on file   Other Topics Concern   ??? Not on file   Social History Narrative   ??? Not on file        OBJECTIVE:  DETAILED PHYSICAL EXAM (12 Point)  General Appearance ?? well-nourished, in no acute distress.   Mood and Affect ?? alert, cooperative and pleasant.   Pulmonary ??  No labored breathing or shortness of breath   Cardiovascular ?? well-perfused distally and no edema.   Lymphatics ?? No lymphadenopathy   Sensation ?? sensation to light touch distally diminished over the right anterior tibia - patient reported   MUSCULOSKELETAL    Bilateral Hip  Inspection/palpation  Range of motion  Stability  Strength  Skin ? Inspection: No swelling, erythema, or deformity visualized  ?? Palpation: Unable to assess tenderness - patient describes bilateral groin pain, right worse than left  ? Range of motion:  Demonstrated within functional limits   ? Strength:  Unable to assess  ? Special Tests: None performed due to virtual visit limitations  ? Skin:  Warm, dry, and intact     Test Results  Imaging  None performed today.    Procedure  None    Medical Decision Making    Amount and complexity of data reviewed   2 = Low, 3 = Moderate, 4 = High Review and/or order of imaging studies (1 point) and Review of or decision to obtain old records(2 points)  Moderate = 3   Number of diagnoses or management options  2 = Low, 3 = Moderate, 4 = High New problem to provider, no additional workup planned (3 points, Max = 1)  Moderate = 3   Level of risk  Low, Moderate, High Moderate: two or more stable chronic illnesses

## 2019-01-16 NOTE — Unmapped (Signed)
- Alternate ice and heat, 20 minutes each, ending with ice - perform this regimen on your lower back  - Perform Home Exercises as described below  - If you have any questions or concerns, contact my medical assistant, Jenness Corner, CMA, at [phone]  - Please feel free to contact the above number if/when you would like to make an appointment for the hip injection we discussed today    Homemade Ice Pack  Supplies:  Letta Pate ziploc bag  - Rubbing Alcohol  - Water    Combine 1?? cups of water with a ?? cup of rubbing alcohol into ziploc bag. Seal and put in the freezer for several hours or overnight.    Hip: Exercises  Your Care Instructions  Here are some examples of typical rehabilitation exercises for your condition. Start each exercise slowly. Ease off the exercise if you start to have pain.  Your doctor or physical therapist will tell you when you can start these exercises and which ones will work best for you.  How to do the exercises  Iliotibial band stretch    1. Lean sideways against a wall. If you are not steady on your feet, hold on to a chair or counter.  2. Stand on the leg with the affected hip, with that leg close to the wall. Then cross your other leg in front of it.  3. Let your affected hip drop out to the side of your body and against the wall. Then lean away from your affected hip until you feel a stretch.  4. Hold the stretch for 15 to 30 seconds.  5. Repeat 2 to 4 times.     Hamstring wall stretch    1. Lie on your back in a doorway, with your good leg through the open door.  2. Slide your affected leg up the wall to straighten your knee. You should feel a gentle stretch down the back of your leg.  1. Do not arch your back.  2. Do not bend either knee.  3. Keep one heel touching the floor and the other heel touching the wall. Do not point your toes.  3. Hold the stretch for at least 1 minute to begin. Then try to lengthen the time you hold the stretch to as long as 6 minutes.  4. Repeat 2 to 4 times.  5. If you do not have a place to do this exercise in a doorway, there is another way to do it:  6. Lie on your back, and bend the knee of your affected leg.  7. Loop a towel under the ball and toes of that foot, and hold the ends of the towel in your hands.  8. Straighten your knee, and slowly pull back on the towel. You should feel a gentle stretch down the back of your leg.  9. Hold the stretch for 15 to 30 seconds. Or even better, hold the stretch for 1 minute if you can.  10. Repeat 2 to 4 times.    Straight-leg raises to the outside    1. Lie on your side, with your affected leg on top.  2. Tighten the front thigh muscles of your top leg to keep your knee straight.  3. Keep your hip and your leg straight in line with the rest of your body, and keep your knee pointing forward. Do not drop your hip back.  4. Lift your top leg straight up toward the ceiling, about 12 inches off the  floor. Hold for about 6 seconds, then slowly lower your leg.  5. Repeat 8 to 12 times.    Clamshell    1. Lie on your side, with your affected leg on top and your head propped on a pillow. Keep your feet and knees together and your knees bent.  2. Raise your top knee, but keep your feet together. Do not let your hips roll back. Your legs should open up like a clamshell.  3. Hold for 6 seconds.  4. Slowly lower your knee back down. Rest for 10 seconds.  5. Repeat 8 to 12 times.    Standing quadriceps stretch    1. If you are not steady on your feet, hold on to a chair, counter, or wall. You can also lie on your stomach or your side to do this exercise.  2. Bend the knee of the leg you want to stretch, and reach behind you to grab the front of your foot or ankle with the hand on the same side. For example, if you are stretching your right leg, use your right hand.  3. Keeping your knees next to each other, pull your foot toward your buttock until you feel a gentle stretch across the front of your hip and down the front of your thigh. Your knee should be pointed directly to the ground, and not out to the side.  4. Hold the stretch for 15 to 30 seconds.  5. Repeat 2 to 4 times.    Piriformis stretch    1. Lie on your back with your legs straight.  2. Lift your affected leg and bend your knee. With your opposite hand, reach across your body, and then gently pull your knee toward your opposite shoulder.  3. Hold the stretch for 15 to 30 seconds.  4. Repeat 2 to 4 times.    Double knee-to-chest    1. Lie on your back with your knees bent and your feet flat on the floor. You can put a small pillow under your head and neck if it is more comfortable.  2. Bring both knees to your chest.  3. Keep your lower back pressed to the floor. Hold for 15 to 30 seconds.  4. Relax, and lower your knees to the starting position.  5. Repeat 2 to 4 times.    Hip Thera-band Exercises      1. Perform each direction for 3 sets of 10 repetitions

## 2019-01-20 MED ORDER — MELOXICAM 7.5 MG TABLET
ORAL_TABLET | Freq: Two times a day (BID) | ORAL | 1 refills | 0.00000 days | Status: CP | PRN
Start: 2019-01-20 — End: ?

## 2019-01-21 NOTE — Unmapped (Unsigned)
Patient is no longer on the medication

## 2019-01-27 NOTE — Unmapped (Signed)
Last clinic visit 09/30/2018.  Next clinic visit not scheduled.

## 2019-01-28 MED ORDER — CLONAZEPAM 0.5 MG TABLET
ORAL_TABLET | 1 refills | 0 days | Status: CP
Start: 2019-01-28 — End: 2019-03-25

## 2019-02-26 NOTE — Unmapped (Signed)
Patient called the nurse line and stated,     I got a message from Dr. Graciela Husbands that my next appointment is the 16th, but I think that might have been a typo because MyChart says it's on the 14th of August.  I'm really nervous about the video setting up, and my computer is down right now.  Can we change this appointment to a phone visit?    Advised patient that I would relay her questions to Dr. Graciela Husbands and call her back with his response.  She verbalized understanding and had no further needs.

## 2019-02-28 MED ORDER — LOSARTAN 25 MG TABLET
ORAL_TABLET | Freq: Every day | ORAL | 0 refills | 0 days | Status: CP
Start: 2019-02-28 — End: ?

## 2019-02-28 NOTE — Unmapped (Signed)
Changed appointment from video to phone for 8/14. Called and left v/m for patient about change. Thanks

## 2019-02-28 NOTE — Unmapped (Signed)
OK phone instead of video

## 2019-02-28 NOTE — Unmapped (Signed)
Refilled:07/26/2018    Last OV: 11/20/2018    Future OV: 04/04/2019

## 2019-03-03 MED ORDER — BUPROPION HCL SR 150 MG TABLET,12 HR SUSTAINED-RELEASE
ORAL_TABLET | 0 refills | 0 days | Status: CP
Start: 2019-03-03 — End: ?

## 2019-03-14 NOTE — Unmapped (Signed)
-----   Message from Annye Asa, CNA sent at 03/14/2019  3:13 PM EDT -----  Regarding: RE: visit needed  Recall entered  ----- Message -----  From: Cy Blamer, PA  Sent: 03/14/2019   1:44 PM EDT  To: Annye Asa, CNA  Subject: visit needed                                     Elease Hashimoto,  Can you please schedule a video / phone visit for 60 minutes for second week in August?  Thank you, Ginna Schuur

## 2019-03-18 NOTE — Unmapped (Signed)
Patient is requesting a referral to Ashlee [facility] since [facility] has hired a Best boy.

## 2019-03-25 NOTE — Unmapped (Signed)
Patient sent her request via the Patient Advise portal.  Last visit 12/25/2018

## 2019-03-27 NOTE — Unmapped (Signed)
Patient called with refill request.  Last clinic visit 12/25/2018

## 2019-03-28 MED ORDER — CLONAZEPAM 0.5 MG TABLET
ORAL_TABLET | 0 refills | 0 days | Status: CP
Start: 2019-03-28 — End: 2019-04-01

## 2019-03-31 NOTE — Unmapped (Signed)
Patient called requesting an emergency 7 day supply of her Clonazepam Rx. She no longer has any left ans have been told by [facility] that it was sent out this morning. Delivery is scheduled for 5 to 7 days. Patient stated she cannot wait that long. She is asking that a 7 day supply be sent to [facility] and the pharmacy has been entered into Epic.  [facility] can override using the [employer] line [phone].

## 2019-03-31 NOTE — Unmapped (Signed)
Error

## 2019-04-01 MED ORDER — CLONAZEPAM 0.5 MG TABLET
ORAL_TABLET | 0 refills | 0 days | Status: CP
Start: 2019-04-01 — End: 2019-04-14

## 2019-04-01 NOTE — Unmapped (Signed)
Assessment:        1. 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 Sleepiness    3. Unsteady gait    4. Tinnitus of both ears    5. Eczema, unspecified type            Plan:        Patient instructions:  Continue off Cymbalta.    Use Triamcinlone cream on dry scaly areas on your feet.    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     Follow up in 6 months for wellness visit.     Barriers to goals identified and addressed. Pertinent handouts were given today and reviewed with the patient as indicated.  The Care Plan and Self-Management goals have been included on the AVS and the AVS has been printed. Any outside resources or referrals needed at this time are noted above.  Any new medications prescribed have been discussed, and side effects have been addressed. Have assessed the patient's understanding, response, and barriers to adherence to medications. Patient voiced understanding and all questions have been answered to satisfaction.        Subjective:     Ashlee Christian is a 68 y.o. female.    I spent 21 minutes on the phone with the patient. I spent an additional 4 minutes on pre- and post-visit activities.     The patient was physically located in [place] or a state in which I am permitted to provide care. The patient and/or parent/guardian understood that s/he may incur co-pays and cost sharing, and agreed to the telemedicine visit. The visit was reasonable and appropriate under the circumstances given the patient's presentation at the time.    The patient and/or parent/guardian has been advised of the potential risks and limitations of this mode of treatment (including, but not limited to, the absence of in-person examination) and has agreed to be treated using telemedicine. The patient's/patient's family's questions regarding telemedicine have been answered.     If the visit was completed in an ambulatory setting, the patient and/or parent/guardian has also been advised to contact their provider???s office for worsening conditions, and seek emergency medical treatment and/or call 911 if the patient deems either necessary.      Patient stated that noone would be present during the phone visit.  If someone will be present patient stated they would be OK with them hearing the conversation.    HPI:  This visit is for:  Follow up of depression, SE of medicine, refill of clobetsol for feet, and MS.     She continues to be followed by neurology at [facility] for her MS.  She continues to have fatigue, balance issues, she is sleepy during the day.  Due to depression she was started on Cymbalta which at first did help her depression symptoms but then she noted she was much more sleepy during the day so she started to taper off it. She had severe withdrawal symptoms of unsteady gait.  She states she now feels somewhat better but her fatigue is much worse since having taken the cymbalta.  She complains of restrictions related to covid but does not feel she is depressed. She continues to paint, walk and speak with family and friends.      She also complains of tinnitus.     She has used clobetasol for eczema on her feet which worked well in the past.  GAD7 Total Score GAD-7 Total Score   04/04/2019 3     PHQ-9 PHQ-9 TOTAL SCORE   04/04/2019 8   11/20/2018 2   08/26/2018 4   07/26/2018 11   06/21/2018 14       The following portions of the patient's history were reviewed and updated as appropriate: allergies, current medications, past family history, past medical history, past social history, past surgical history and problem list.    Review of Systems  Pertinent items are noted in HPI.          Objective:      Physical Exam:    Wt Readings from Last 6 Encounters:   09/30/18 61.3 kg (135 lb 3.2 oz)   08/26/18 60.5 kg (133 lb 6.4 oz)   08/07/18 61 kg (134 lb 6.4 oz)   07/26/18 60.4 kg (133 lb 3.2 oz)   06/21/18 60.2 kg (132 lb 12.8 oz)   06/05/18 62.8 kg (138 lb 8 oz) Laboratory Values:  Results for orders placed or performed in visit on 09/30/18   Comprehensive Metabolic Panel   Result Value Ref Range    Sodium 135 135 - 145 mmol/L    Potassium 5.2 (H) 3.5 - 5.0 mmol/L    Chloride 97 (L) 98 - 107 mmol/L    Anion Gap 7 7 - 15 mmol/L    CO2 31.0 (H) 22.0 - 30.0 mmol/L    BUN 19 7 - 21 mg/dL    Creatinine 1.61 (L) 0.60 - 1.00 mg/dL    BUN/Creatinine Ratio 32     EGFR CKD-EPI Non-African American, Female >90 >=60 mL/min/1.48m2    EGFR CKD-EPI African American, Female >90 >=60 mL/min/1.60m2    Glucose 104 65 - 179 mg/dL    Calcium 9.4 8.5 - 09.6 mg/dL    Albumin 4.6 3.5 - 5.0 g/dL    Total Protein 7.1 6.5 - 8.3 g/dL    Total Bilirubin 0.2 0.0 - 1.2 mg/dL    AST 18 14 - 38 U/L    ALT 14 <35 U/L    Alkaline Phosphatase 92 38 - 126 U/L   TSH   Result Value Ref Range    TSH 2.654 0.600 - 3.300 uIU/mL   CBC w/ Differential   Result Value Ref Range    WBC 5.1 4.5 - 11.0 10*9/L    RBC 4.33 4.00 - 5.20 10*12/L    HGB 14.2 13.5 - 16.0 g/dL    HCT 04.5 40.9 - 81.1 %    MCV 94.6 80.0 - 100.0 fL    MCH 32.7 26.0 - 34.0 pg    MCHC 34.5 31.0 - 37.0 g/dL    RDW 91.4 78.2 - 95.6 %    MPV 6.4 (L) 7.0 - 10.0 fL    Platelet 321 150 - 440 10*9/L    Neutrophils % 75.8 %    Lymphocytes % 11.0 %    Monocytes % 7.3 %    Eosinophils % 3.3 %    Basophils % 0.6 %    Absolute Neutrophils 3.8 2.0 - 7.5 10*9/L    Absolute Lymphocytes 0.6 (L) 1.5 - 5.0 10*9/L    Absolute Monocytes 0.4 0.2 - 0.8 10*9/L    Absolute Eosinophils 0.2 0.0 - 0.4 10*9/L    Absolute Basophils 0.0 0.0 - 0.1 10*9/L    Large Unstained Cells 2 0 - 4 %

## 2019-04-01 NOTE — Unmapped (Addendum)
Continue off Cymbalta.    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     Follow up in 6 months for wellness visit.

## 2019-04-04 ENCOUNTER — Institutional Professional Consult (permissible substitution): Admit: 2019-04-04 | Discharge: 2019-04-05 | Payer: MEDICARE | Attending: Family Medicine | Primary: Family Medicine

## 2019-04-04 DIAGNOSIS — R400 Somnolence: Secondary | ICD-10-CM

## 2019-04-04 DIAGNOSIS — F320 Major depressive disorder, single episode, mild: Principal | ICD-10-CM

## 2019-04-04 DIAGNOSIS — H9313 Tinnitus, bilateral: Secondary | ICD-10-CM

## 2019-04-04 DIAGNOSIS — R2681 Unsteadiness on feet: Secondary | ICD-10-CM

## 2019-04-04 DIAGNOSIS — L309 Dermatitis, unspecified: Secondary | ICD-10-CM

## 2019-04-04 MED ORDER — TRIAMCINOLONE ACETONIDE 0.5 % TOPICAL CREAM
Freq: Three times a day (TID) | TOPICAL | 0 refills | 0.00000 days | Status: CP
Start: 2019-04-04 — End: 2020-04-03

## 2019-04-04 MED ORDER — OMEPRAZOLE 20 MG CAPSULE,DELAYED RELEASE
ORAL_CAPSULE | Freq: Every day | ORAL | 3 refills | 90 days | Status: CP
Start: 2019-04-04 — End: ?

## 2019-04-14 DIAGNOSIS — G35 Multiple sclerosis: Principal | ICD-10-CM

## 2019-04-14 DIAGNOSIS — G4700 Insomnia, unspecified: Secondary | ICD-10-CM

## 2019-04-14 DIAGNOSIS — M792 Neuralgia and neuritis, unspecified: Secondary | ICD-10-CM

## 2019-04-14 DIAGNOSIS — F419 Anxiety disorder, unspecified: Secondary | ICD-10-CM

## 2019-04-14 MED ORDER — CLONAZEPAM 0.5 MG TABLET
ORAL_TABLET | 2 refills | 0 days | Status: CP
Start: 2019-04-14 — End: ?

## 2019-04-14 MED ORDER — GABAPENTIN 300 MG CAPSULE
ORAL_CAPSULE | Freq: Three times a day (TID) | ORAL | 1 refills | 90 days | Status: CP
Start: 2019-04-14 — End: ?

## 2019-04-14 MED ORDER — CARBAMAZEPINE ER 200 MG CAPSULE,EXTENDED RELEASE MPHASE12HR
ORAL_CAPSULE | Freq: Three times a day (TID) | ORAL | 1 refills | 90 days | Status: CP
Start: 2019-04-14 — End: ?

## 2019-04-14 NOTE — Unmapped (Signed)
[facility]  Multiple Sclerosis / Neuroimmunology Division  Scout Gumbs [facility]  Physician Assistant    Phone: [phone]  Fax: [phone]      Patient Name: Ashlee Christian   Date of Birth: [birth year]  Medical Record Number: [id number]  [address]  [place] [address]     Direct entry by:  Cy Blamer, PA-C.  Supervising Physician: Dr. Desma Mcgregor.    DATE OF VISIT: April 14, 2019    REASON FOR PATIENT OUTREACH /  video CALL ENCOUNTER: Follow up for evaluation of Multiple Sclerosis.  Last seen for video or phone visit by Yolande Jolly 12/25/2018.    Patient location: Home in the [place].  Start time 1500 , End time 1540 , Total visit time (includes pre and post visit activities) 50 minutes.    I spent 40 minutes on the real-time audio and video with the patient. I spent an additional 10 minutes on pre- and post-visit activities.     The patient was physically located in [place] or a state in which I am permitted to provide care. The patient and/or parent/guardian understood that s/he may incur co-pays and cost sharing, and agreed to the telemedicine visit. The visit was reasonable and appropriate under the circumstances given the patient's presentation at the time.    The patient and/or parent/guardian has been advised of the potential risks and limitations of this mode of treatment (including, but not limited to, the absence of in-person examination) and has agreed to be treated using telemedicine. The patient's/patient's family's questions regarding telemedicine have been answered.     If the visit was completed in an ambulatory setting, the patient and/or parent/guardian has also been advised to contact their provider???s office for worsening conditions, and seek emergency medical treatment and/or call 911 if the patient deems either necessary.     Evaluated by Dr. Johnnye Lana 09/30/2018.      ASSESSMENT AND PLAN:   ** Secondary Progressive Multiple Sclerosis:   . Continue off of DMT given her age and side effects to DMT.  -Plan for MRI's per patient request June 2021 due to COVID19.    **Sensory symptoms:   Refill Gabapentin to 600mg  TID along with Carbamazepine 200mg  BID to [facility] for 90 day supply and one refill.  Patient will check sodium level at PCP. Administrative assistant asked to coordinate.  **Refill Klonopin for 30 days and one refill to [facility].  **Recommend flu vaccine.  -Plan for a video follow up in 6 months.    Denies travel within the past 14 days, respiratory symptoms, fever/ chills, cough, shortness of breath , GI symptoms, loss of smell  / taste or muscle aches.  Has not been in contact with anyone who has tested positive for COVID19 or tested positive themselves in the past 21 days.  Advised to follow CDC and [employer] guidelines.  In the event of respiratory symptoms, fever, cough, shortness of breath , GI symptoms, loss of smell  / taste or muscle aches advised to call PCP.     INTERVAL HISTORY / CHIEF COMPLAINT:  Itching resolved 100%.  No new problems or clinical flare-ups.  Patient wanted to review medications to make sure that she was taking correctly.    PRIOR HISTORY:  A 68 y.o. Caucasian female who was diagnosed with multiple sclerosis in 09/2007.     The disease evolution is since her  age of 68,  the disease follows a slowly secondary-progressive course.   She was treated with Copaxone and switched to Tecfidera on 09/04/2017 due to disease activity. Stopped 09/2018 due to Lymphocytopenia.  Plegridy started 09/2018 - 11/2018 due to  flu like symptoms since the first injection, fatigue and itchy scalp.    Started Ampyra but it made her feel more off balanced. Patient discontinue and balance improved.    Pulmonologist thinks her breathing problems are neurologic. She had two infections (RI and ear infection) over the last 1.5 months. REVIEW OF SYSTEMS:  10 systems reviewed and otherwise negative except as noted in HPI.  Current Outpatient Medications   Medication Sig Dispense Refill   ??? baclofen (LIORESAL) 20 MG tablet TAKE 1 TABLET THREE TIMES DAILY 270 tablet 1   ??? buPROPion (WELLBUTRIN SR) 150 MG 12 hr tablet TAKE 1 TABLET (150 MG TOTAL) BY MOUTH TWO (2) TIMES A DAY. CHANGE IN STRENGTH. 180 tablet 0   ??? calcium carbonate/vitamin D3 (CALCIUM 500 + D ORAL) Take 1 tablet by mouth daily.     ??? carBAMazepine (CARBATROL) 200 MG 12 hr capsule Take 1 capsule (200 mg total) by mouth Three (3) times a day. 270 capsule 1   ??? cholecalciferol, vitamin D3, 2,000 unit cap Take 4,000 Units by mouth daily.      ??? clobetasol (TEMOVATE) 0.05 % cream Apply 1 application topically Two (2) times a day.     ??? clonazePAM (KLONOPIN) 0.5 MG tablet Take 1 mg in the morning, 0.5 mg at lunchtime and 1 mg at bedtime. 150 tablet 2   ??? gabapentin (NEURONTIN) 300 MG capsule Take 2 capsules (600 mg total) by mouth Three (3) times a day. 540 capsule 1   ??? losartan (COZAAR) 25 MG tablet TAKE 1 TABLET (25 MG TOTAL) BY MOUTH DAILY. 90 tablet 0   ??? meloxicam (MOBIC) 7.5 MG tablet Take 1 tablet (7.5 mg total) by mouth two (2) times a day as needed for pain. 180 tablet 1   ??? multivitamin (TAB-A-VITE/THERAGRAN) per tablet Take 1 tablet by mouth daily.     ??? omeprazole (PRILOSEC) 20 MG capsule Take 1 capsule (20 mg total) by mouth daily. 90 capsule 3   ??? polyethylene glycol (GLYCOLAX) 17 gram/dose powder Take 17 g by mouth every other day.      ??? triamcinolone (KENALOG) 0.5 % cream Apply topically Three (3) times a day. For dry scaly skin on feet. 30 g 0   ??? BIOTIN ORAL Take 1 tablet by mouth once daily.     ??? empty container Misc Use as directed to dispose of injectable medications 1 each 3   ??? Lactobacillus acidophilus (PROBIOTIC ORAL) Take 1 tablet by mouth daily.     ??? NARCAN 4 mg/actuation nasal spray CALL 911. ADMINISTER A SINGLE SPRAY OF NARCAN IN ONE NOSTRIL. REPEAT EVERY 3 MINUTES AS NEEDED IF NO OR MINIMAL RESPONSE.  0     No current facility-administered medications for this visit.        Patient Active Problem List   Diagnosis   ??? Multiple sclerosis (CMS-HCC)   ??? Binocular vision disorder with diplopia   ??? Spinal stenosis of cervical region   ??? Hypertension   ??? Cognitive complaints   ??? Neuropathic pain   ??? Bilateral occipital neuralgia   ??? Depression   ??? Hx of fusion of cervical spine   ??? Hesitancy of micturition   ??? Anxiety   ??? Hyperinflation of lungs   ???  Cough   ??? Insomnia   ??? Leucopenia   ??? Low sodium levels   ??? Cognitive change   ??? Other chest pain   ??? Chronic fatigue       Allergies   Allergen Reactions   ??? Amantadine Nausea Only   ??? Codeine Itching     VIDEO EXAM:  General :   In no acute distress. Normal skin color.     Alert and oriented to person, place, time and situation.   Recent and remote memory intact.    Attention span and concentration normal.    Language and spontaneous speech normal, no dysarthria or aphasia.  Fund of knowledge normal.     Neurological Examination:     Cranial Nerves:   Il, lll- Reports no changes in vision.  II, IV, VI- extra ocular movements are intact, No ptosis, no nystagmus.  V- sensation of the face intact b/l.Patient performed on self.  VII- face symmetrical, no facial droop, normal facial movements with smile/grimace  VIII- Hearing grossly intact.  XI- Full shoulder shrug bilaterally  XII- Tongue protrudes midline, full range of movements of the tongue    Neck flexion normal.    Sensory UEs LEs    R L R L   Light touch performed by patient WNL WNL WNL WNL     Cerebellar/Coordination:  Rapid alternating movements, finger-to-nose and heel-to-shin  bilaterally demonstrates no abnormalities.  No tremors noted.    Motor exam:  Gait: Uses one cane. Gait slow and slightly wide based.

## 2019-04-14 NOTE — Unmapped (Addendum)
**    Have your Sodium level checked at your primary providers office. My Environmental health practitioner will send them the orders.    **I have sent a prescription  for 90 days with one refill for Carbamazepine and Gabapentin to [facility] pharmacy.  **I have sent a prescription for 30 days with 2 refills for Klonopin to [facility].  **Please obtain flu vaccine before 06/21/2019.  **Follow up in 6 months.     ??   In case of:  ?? a suspected relapse (new symptoms or worsening existing symptoms, lasting for >24h)  OR  ?? a need for an additional appointment for other reasons     Please contact:    [facility] Neurology Call Center  Phone: [phone]   Fax: [phone]       [facility], Department of Neurology /  Multiple Sclerosis Division    [address]    [place], [place] [address]

## 2019-04-15 NOTE — Unmapped (Signed)
Pt has been advised that her lab order was faxed to her pcp. She stated that she is having some back problems and that the drive is about 30 minutes. She said that she will try to get there when she can. Fax confirmation in the media tab.AN

## 2019-04-19 ENCOUNTER — Emergency Department
Admit: 2019-04-19 | Discharge: 2019-04-19 | Disposition: A | Discharge status: Home / Self Care | Payer: MEDICARE | Attending: Student in an Organized Health Care Education/Training Program

## 2019-04-19 ENCOUNTER — Ambulatory Visit
Admit: 2019-04-19 | Discharge: 2019-04-19 | Disposition: A | Discharge status: Home / Self Care | Payer: MEDICARE | Attending: Student in an Organized Health Care Education/Training Program

## 2019-04-19 DIAGNOSIS — M25551 Pain in right hip: Principal | ICD-10-CM

## 2019-04-19 MED ORDER — OXYCODONE 5 MG TABLET
ORAL_TABLET | Freq: Four times a day (QID) | ORAL | 0 refills | 5 days | Status: CP | PRN
Start: 2019-04-19 — End: 2019-04-24

## 2019-04-19 MED ORDER — ACETAMINOPHEN 325 MG TABLET
ORAL_TABLET | Freq: Four times a day (QID) | ORAL | 0 refills | 12 days | Status: CP | PRN
Start: 2019-04-19 — End: 2019-05-03

## 2019-04-19 MED ORDER — IBUPROFEN 800 MG TABLET
ORAL_TABLET | Freq: Three times a day (TID) | ORAL | 0 refills | 10 days | Status: CP | PRN
Start: 2019-04-19 — End: 2019-05-03

## 2019-04-19 MED ORDER — LIDOCAINE 5 % TOPICAL PATCH
MEDICATED_PATCH | TRANSDERMAL | 0 refills | 15 days | Status: CP
Start: 2019-04-19 — End: 2019-05-04

## 2019-04-19 NOTE — Unmapped (Signed)
Pt c/o back pain, right hip pain radiating down right leg for weeks.

## 2019-04-19 NOTE — Unmapped (Signed)
[facility]  Emergency Department Provider Note    ED Clinical Impression     Final diagnoses:   Right hip pain (Primary)       Initial Impression, ED Course, Assessment and Plan     Impression  68 y.o. female presenting with a 2-week history of acute on chronic lower back pain, R hip pain, and new onset numbness in her RLE consistent with the L5 distribution.  Notably, her PMH includes MS, osteoarthritis of the bilateral hips, and lumbar radiculopathy that is currently being managed by both the spine center and [facility] orthopedics.  Patient subjectively states her main concern today is pain control.    BP 138/74  - Pulse 66  - Temp 36.3 ??C (97.3 ??F) (Oral)  - Resp 16  - SpO2 96%     VSS and HDS. Well-appearing, pleasant and smiling on exam and nontoxic. No clinical signs suggesting severe systemic illness. Clinical exam as documented below, but briefly remarkable for pinpoint TTP in the bilateral sacral area, R > L, without overlying skin changes.  Neurological exam appears consistent with her baseline, notable for R-sided foot drop.  Reports subjective numbness in the L5 distribution of the RLE.  Upon chart review, patient had a lumbar spine MRI in 12/2017 with documented moderate foraminal narrowing at L4-L5, as well as multilevel degenerative changes.    Clinical picture appears consistent with acute on chronic lower back pain in the setting of a complex PMH and recently running out of Tylenol, which she uses daily for pain management.  However, given her reports of multiple falls within the past 3 months in which she has not presented for medical evaluation, occult fracture is additionally considered. Unfortunately, the patient did have an upcoming appointment with orthopedics in 2 days on 04/21/2019 for these process symptoms, however patient had to cancel this appointment due to transportation issues and the current COVID-19 pandemic. Per chart review, patient's most recent orthopedic appointment was on 01/16/2019 for bilateral primary osteoarthritis of hip. Plan at that time was to treat hip arthritis first with conservative management, and if minimal improvement she would be referred back to spine center for consideration of ESI treatment. No concerning risk factors such as malignancy, immunosuppression, severe CAD or HTN, or IVDU. No red flag symptoms such as fever, FND, weight loss, paresthesias, or bowel or bladder incontinence/retention to suggest cord compression. Low suspicion for cauda equina, epidural abscess, or epidural hematoma. No pulsatile abdominal mass or unequal pulses to suggest AAA or aortic dissection.    Plan for pXR R hip with pelvis, and multimodal pain control with Tylenol, Motrin, lidocaine patch, and 2.5 mg Roxicodone.  Will monitor closely and reassess.  Anticipate discharge.    3:15 PM:  pXR imaging negative - no acute fracture, dislocation, or other osseous abnormality. Joint spacing and articulation are preserved.  Upon reevaluation, patient states she feels much improved.  Plan to discharge home with close outpatient follow-up.  Patient is in agreement with this plan.  Prescriptions were provided for Tylenol, Motrin, lidocaine patch, and a short course of 2.5 mg Roxicodone for severe symptoms to bridge her until she can establish outpatient follow-up.  Strict return precautions given.  All questions answered.    Additional Medical Decision Making     I have reviewed the vital signs and the nursing notes. Labs and radiology results that were available during my care of the patient were independently reviewed by me and considered in my medical decision making. I reviewed  the patient's prior medical records.     I staffed the case with the ED attending, Dr. Audrea Muscat.    I independently visualized the patient's previous medical records.  I independently visualized the radiology images.      History     Chief Complaint  Leg Pain      HPI   Ashlee Christian is a 68 y.o. female with a PMH of MS, osteoporosis, HTN, and diverticulosis presenting for evaluation of acute on chronic back, R hip, and R leg pain. Patient states that for 2 weeks she has had constant, burning back pain and R hip pain radiating down R leg. Her R hip and R leg pain is more severe than prior episodes, and is worse w/ walking. She has tried application of ice and heat to the affected areas as well as QID Ibuprofen w/ minimal relief. She additionally endorses 2 weeks of constant lateral RLE numbness, which is of longer duration than prior episodes. In the last 3 months, she has had 2 bad falls onto her R hip due to difficulties walking. She walks with a cane, and has recently had limited activity due to pain.  Additionally, she had her wisdom tooth extraction ~10 days ago and continues to experience slight intraoral soreness.  No difficulty tolerating p.o.  She has transportation difficulties related to COVID-19. She stopped her preventative steroid therapy when COVID-19 began. Denies fevers, chills, chest pain, abdominal pain, nausea, vomiting, diarrhea, dysuria, or hematochezia.    Per chart review, patient spoke with orthopedics on 01/16/2019 for bilateral primary osteoarthritis of hip. She had 1 month of severe groin pain (R>L) along w/ radiating pain, numbness, and tingling. Her R leg drag became worse w/ these symptoms. Since her MS diagnosis at age 45, she has had variable severity of R leg dragging. She was seen by Spine Center at end of 2019 d/t radiating pain w/ numbness and tingling into the lower extremities, and was diagnosed w/ lumbar radiculopathy. She has not had any physical therapy or injections for the radiculopathy. She has had outside XR of bilateral hips that showed degenerative changes of both hips and the lumbar spine. They discussed possibility that symptoms could arise from hip or lumbar spine. Plan was to treat hip arthritis first. If minimal improvement, she would be referred back to spine center for consideration of ESI treatment.       Past Medical History:   Diagnosis Date   ??? Diverticulitis large intestine 2000    with abscess and perforation with peritonitis   ??? Hypertension 2012   ??? 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 Osteoporosis        Patient Active Problem List   Diagnosis   ??? Multiple sclerosis (CMS-HCC)   ??? Binocular vision disorder with diplopia   ??? Spinal stenosis of cervical region   ??? Hypertension   ??? Cognitive complaints   ??? Neuropathic pain   ??? Bilateral occipital neuralgia   ??? Depression   ??? Hx of fusion of cervical spine   ??? Hesitancy of micturition   ??? Anxiety   ??? Hyperinflation of lungs   ??? Cough   ??? Insomnia   ??? Leucopenia   ??? Low sodium levels   ??? Cognitive change   ??? Other chest pain   ??? Chronic fatigue       Past Surgical History:   Procedure Laterality Date   ??? CATARACT EXTRACTION Bilateral 2016  [facility]   ??? COLECTOMY  2001    Partial   ??? EYE SURGERY Left 2012    Laser sx due to detached retina    ??? HYSTERECTOMY     ??? PR ALLOGRAFT FOR SPINE SURGERY ONLY MORSELIZED Midline 03/04/2017    Procedure: Allograft For Spine Surgery Only; Morselized;  Surgeon: Timothy Lasso, MD;  Location: MAIN OR [facility];  Service: Ortho Spine   ??? PR ANTERIOR INSTRUMENTATION 4-7 VERTEBRAL SEGMENTS Midline 03/04/2017    Procedure: Anterior Instrumentation; 4 To 7 Vertebral Segments;  Surgeon: Timothy Lasso, MD;  Location: MAIN OR [facility];  Service: Ortho Spine   ??? PR ARTHRODESIS ANT INTERBODY INC DISCECTOMY, CERVICAL BELOW C2 Midline 03/04/2017    Procedure: Arthrodes, Ant Intrbdy, Incl Disc Spc Prep, Discect, Osteophyt/Decompress Spinl Crd &/Or Nrv Rt, Crv Blo C2;  Surgeon: Timothy Lasso, MD;  Location: MAIN OR [facility];  Service: Ortho Spine   ??? PR ARTHRODESIS ANT INTERBODY MIN DISCECTOMY, CERVICAL BELOW C2  03/04/2017    Procedure: Arthrodesis, Anterior Interbody Technique, Include Minimal Diskectomy To Prep Interspace; Cervical Below C2;  Surgeon: Timothy Lasso, MD;  Location: MAIN OR [facility];  Service: Ortho Spine   ??? PR ARTHRODESIS ANT INTERBODY MIN DISCECTOMY,EA ADDL  03/04/2017    Procedure: Arthrodesis, Anterior Interbody Technique, W/Minimal Diskectomy To Prep Interspace; Each Add`L Interspace;  Surgeon: Timothy Lasso, MD;  Location: MAIN OR [facility];  Service: Ortho Spine   ??? PR AUTOGRAFT SPINE SURGERY LOCAL FROM SAME INCISION Midline 03/04/2017    Procedure: Autograft/Spine Surg Only (W/Harvest Graft); Local (Eg, Rib/Spinous Proc, Ples Specter) Obtain From Same Incis;  Surgeon: Timothy Lasso, MD;  Location: MAIN OR [facility];  Service: Ortho Spine   ??? PR INSJ BIOMCHN DEV INTERVERTEBRAL DSC SPC W/ARTHRD Midline 03/04/2017    Procedure: Insert Interbody Biomechanical Device(S) With Integral Anterior Instrument For Device Anchoring, When Performed, To Intervertebral Disc Space In Conjunction With Interbody Arthrodesis, Each Interspace;  Surgeon: Timothy Lasso, MD;  Location: MAIN OR [facility];  Service: Ortho Spine   ??? PR INSJ BIOMCHN DEV VRT CORPECTOMY DEFECT W/ARTHRD Midline 03/04/2017    Procedure: Insert Intervertebral Biomechanical Device(S) W Integral Anterior Instrument For Anchoring, When Performed, To Vert Corpectomy(Ies) Defect, In Conjunction W Interbody Arthrodesis, Each Contig Defect;  Surgeon: Timothy Lasso, MD;  Location: MAIN OR [facility];  Service: Ortho Spine   ??? PR REMV VERT BODY,CERV,ONE SGMT Midline 03/04/2017    Procedure: Vertebral Corpectomy-Ant W/Decomp; Cerv 1 Segmt;  Surgeon: Timothy Lasso, MD;  Location: MAIN OR [facility];  Service: Ortho Spine   ??? TONSILLECTOMY  1964   ??? TOTAL ABDOMINAL HYSTERECTOMY W/ BILATERAL SALPINGOOPHORECTOMY  2000       No current facility-administered medications for this encounter.     Current Outpatient Medications:   ???  baclofen (LIORESAL) 20 MG tablet, TAKE 1 TABLET THREE TIMES DAILY, Disp: 270 tablet, Rfl: 1  ???  BIOTIN ORAL, Take 1 tablet by mouth once daily., Disp: , Rfl:   ???  buPROPion (WELLBUTRIN SR) 150 MG 12 hr tablet, TAKE 1 TABLET (150 MG TOTAL) BY MOUTH TWO (2) TIMES A DAY. CHANGE IN STRENGTH., Disp: 180 tablet, Rfl: 0  ???  calcium carbonate/vitamin D3 (CALCIUM 500 + D ORAL), Take 1 tablet by mouth daily., Disp: , Rfl:   ???  carBAMazepine (CARBATROL) 200 MG 12 hr capsule, Take 1 capsule (200 mg total) by mouth Three (3) times a day., Disp: 270 capsule, Rfl: 1  ???  cholecalciferol,  vitamin D3, 2,000 unit cap, Take 4,000 Units by mouth daily. , Disp: , Rfl:   ???  clobetasol (TEMOVATE) 0.05 % cream, Apply 1 application topically Two (2) times a day., Disp: , Rfl:   ???  clonazePAM (KLONOPIN) 0.5 MG tablet, Take 1 mg in the morning, 0.5 mg at lunchtime and 1 mg at bedtime., Disp: 150 tablet, Rfl: 2  ???  empty container Misc, Use as directed to dispose of injectable medications, Disp: 1 each, Rfl: 3  ???  gabapentin (NEURONTIN) 300 MG capsule, Take 2 capsules (600 mg total) by mouth Three (3) times a day., Disp: 540 capsule, Rfl: 1  ???  Lactobacillus acidophilus (PROBIOTIC ORAL), Take 1 tablet by mouth daily., Disp: , Rfl:   ???  losartan (COZAAR) 25 MG tablet, TAKE 1 TABLET (25 MG TOTAL) BY MOUTH DAILY., Disp: 90 tablet, Rfl: 0  ???  meloxicam (MOBIC) 7.5 MG tablet, Take 1 tablet (7.5 mg total) by mouth two (2) times a day as needed for pain., Disp: 180 tablet, Rfl: 1  ???  multivitamin (TAB-A-VITE/THERAGRAN) per tablet, Take 1 tablet by mouth daily., Disp: , Rfl:   ???  NARCAN 4 mg/actuation nasal spray, CALL 911. ADMINISTER A SINGLE SPRAY OF NARCAN IN ONE NOSTRIL. REPEAT EVERY 3 MINUTES AS NEEDED IF NO OR MINIMAL RESPONSE., Disp: , Rfl: 0  ???  omeprazole (PRILOSEC) 20 MG capsule, Take 1 capsule (20 mg total) by mouth daily., Disp: 90 capsule, Rfl: 3  ???  polyethylene glycol (GLYCOLAX) 17 gram/dose powder, Take 17 g by mouth every other day. , Disp: , Rfl:   ???  triamcinolone (KENALOG) 0.5 % cream, Apply topically Three (3) times a day. For dry scaly skin on feet., Disp: 30 g, Rfl: 0    Allergies  Amantadine and Codeine    Family History   Problem Relation Age of Onset   ??? Depression Mother    ??? Hypertension Mother    ??? Hyperlipidemia Mother    ??? Stroke Father    ??? Hyperlipidemia Father    ??? Hypertension Father    ??? Hypertension Sister    ??? No Known Problems Brother    ??? No Known Problems Maternal Aunt    ??? No Known Problems Maternal Uncle    ??? No Known Problems Paternal Aunt    ??? Alcohol abuse Paternal Uncle    ??? Stroke Maternal Grandmother    ??? Hyperlipidemia Maternal Grandmother    ??? Hypertension Maternal Grandmother    ??? Cancer Maternal Grandfather    ??? Hyperlipidemia Maternal Grandfather    ??? Hypertension Maternal Grandfather    ??? Hyperlipidemia Paternal Grandmother    ??? Hyperlipidemia Paternal Grandfather    ??? Amblyopia Neg Hx    ??? Blindness Neg Hx    ??? Cataracts Neg Hx    ??? Diabetes Neg Hx    ??? Glaucoma Neg Hx    ??? Macular degeneration Neg Hx    ??? Retinal detachment Neg Hx    ??? Strabismus Neg Hx    ??? Thyroid disease Neg Hx        Social History  Social History     Tobacco Use   ??? Smoking status: Former Smoker     Types: Cigarettes     Quit date: 01/19/2016     Years since quitting: 3.2   ??? Smokeless tobacco: Never Used   Substance Use Topics   ??? Alcohol use: No   ??? Drug use: No  Review of Systems  Constitutional: Negative for fever.  Eyes: Negative for visual changes.  ENT:  Negative for sore throat.  Cardiovascular: Negative for chest pain.  Respiratory: Negative for shortness of breath.  Gastrointestinal: Negative for abdominal pain, vomiting or diarrhea.  Genitourinary: Negative for dysuria.  Musculoskeletal: Positive for back, R hip, and R leg pain.  Skin: Negative for rash.  Neurological: Positive for RLE numbness. Negative for headaches or focal weakness.  10-point ROS is otherwise negative except as documented.    Physical Exam     ED Triage Vitals [04/19/19 1210]   Enc Vitals Group      BP 138/74      Heart Rate 66      SpO2 Pulse       Resp 16      Temp 36.3 ??C (97.3 ??F)      Temp Source Oral      SpO2 96 %     Constitutional: Alert and oriented. Well appearing and in no distress.  Eyes: Conjunctivae are normal.  ENT       Head: Normocephalic and atraumatic.       Nose: No congestion.       Mouth/Throat: Mucous membranes are moist.       Neck: No stridor.  Hematological/Lymphatic/Immunilogical: No anterior cervical lymphadenopathy.  Cardiovascular: Normal rate, regular rhythm. S1 and S2. Normal and symmetric distal pulses are present in all extremities. Warm and well perfused.  Respiratory: Normal respiratory effort. No increased WOB. Lungs clear to auscultation bilaterally. No wheezing or rales.  Gastrointestinal: Soft and nontender. No rebound or guarding. No CVA tenderness.  Musculoskeletal: Bilateral pinpoint sacral TTP (R>L). No overlying skin changes.   Neurologic: PEERL. EOMI. Tongue midline. Normal facial sensation. Face is symmetric. 4/5 strength in the upper and lower extremities both proximally and distally. Normal sensation intact in the upper extremities both proximally and distally.  Focal decreased sensation in the L5 distribution of the RLE.  Otherwise, normal sensation intact in the lower extremities. Normal reflexes in upper and lower extremities. Negative pronator drift. Intact heel-to-shin bilaterally. Intact finger-to-nose bilaterally. Intact fine finger movements bilaterally. Normal tone. No tremor noted.  No clonus.  Speech and language are normal.  No gross neurological deficits noted.  Skin: Skin is warm, dry and intact. No rash noted.  Psychiatric: Mood and affect are normal. Speech and behavior are normal.       EKG     N/A    Radiology     XR Hip W Pelvis Right   Preliminary Result   -- No acute osseous abnormality of the pelvis or right hip.           Procedures     N/A    Please note- This chart has been created using [employer]. Chart creation errors have been sought, but may not always be located and such creation errors, especially pronoun confusion, do NOT reflect on the standard of medical care.    Documentation assistance was provided by Raynald Blend, Scribe, on April 19, 2019 at 12:56 PM for Dorian Heckle, MD.     Documentation assistance was provided by the scribe in my presence.  The documentation recorded by the scribe has been reviewed by me and accurately reflects the services I personally performed.     Dorian Heckle, MD  Resident  04/19/19 2229

## 2019-04-20 NOTE — Unmapped (Signed)
Discharge instructions reviewed. Follow up care discussed. Medications discussed. Patient given prescriptions. Patient denies any further questions at this time.     Pt taken off unit by wheelchair by this RN to meet family in front of ER.

## 2019-04-20 NOTE — Unmapped (Signed)
ED Progress Note    S/o from prior physician, 68 y/o with hip pain, worse with movement. XR c/w arthritic changes likely explaining sx. S/o was pain control and re-eval for dc.    Pt having worse pain after hip manipulation from XR. Pain improved with low dose oxy. On re-eval, pt with pain control. Will d/c home.

## 2019-04-20 NOTE — Unmapped (Signed)
REASSESSMENT: NO changes to previous assessment. Will continue to monitor.

## 2019-04-21 NOTE — Unmapped (Signed)
msg. on VM: Pt. had an appt. here today but no transportation. Went to the ED yesterday due to extremepain. States that shes using a walker to get around the house. NWB on RLE due to pain. Describes numbness to feet and burning down RLE and shin. Not sleeping either. Was given Oxycodone and told to take Tylenol and/or iburofen, none of which are helping her. It's like drinking a glass of water to me. Would like to know what we can do to help. She said that she was R/S to 10/31, but I do not see an appt. on the sch. She also sent My Chart msgs. to [employer] in neuro. Please advise.

## 2019-04-22 NOTE — Unmapped (Signed)
This patient has been disenrolled from the [facility] specialty pharmacy services due to medication discontinuation resulting from side effect intolerance.    Arnold Long  [facility] Specialty Pharmacist

## 2019-04-28 ENCOUNTER — Ambulatory Visit: Admit: 2019-04-28 | Discharge: 2019-05-02 | Disposition: A | Discharge status: Home Health | Payer: MEDICARE

## 2019-04-28 DIAGNOSIS — Z791 Long term (current) use of non-steroidal anti-inflammatories (NSAID): Secondary | ICD-10-CM

## 2019-04-28 DIAGNOSIS — R531 Weakness: Secondary | ICD-10-CM

## 2019-04-28 DIAGNOSIS — Z87891 Personal history of nicotine dependence: Secondary | ICD-10-CM

## 2019-04-28 DIAGNOSIS — M810 Age-related osteoporosis without current pathological fracture: Secondary | ICD-10-CM

## 2019-04-28 DIAGNOSIS — G35 Multiple sclerosis: Secondary | ICD-10-CM

## 2019-04-28 DIAGNOSIS — E871 Hypo-osmolality and hyponatremia: Secondary | ICD-10-CM

## 2019-04-28 DIAGNOSIS — M4854XA Collapsed vertebra, not elsewhere classified, thoracic region, initial encounter for fracture: Secondary | ICD-10-CM

## 2019-04-28 DIAGNOSIS — Z20828 Contact with and (suspected) exposure to other viral communicable diseases: Secondary | ICD-10-CM

## 2019-04-28 DIAGNOSIS — R339 Retention of urine, unspecified: Secondary | ICD-10-CM

## 2019-04-28 DIAGNOSIS — I10 Essential (primary) hypertension: Secondary | ICD-10-CM

## 2019-04-28 DIAGNOSIS — R54 Age-related physical debility: Secondary | ICD-10-CM

## 2019-04-28 DIAGNOSIS — W19XXXA Unspecified fall, initial encounter: Secondary | ICD-10-CM

## 2019-05-02 MED ORDER — PANTOPRAZOLE 40 MG TABLET,DELAYED RELEASE
ORAL_TABLET | Freq: Every day | ORAL | 0 refills | 30.00000 days | Status: CP
Start: 2019-05-02 — End: 2019-06-01

## 2019-05-02 MED ORDER — MELATONIN 3 MG TABLET
ORAL_TABLET | Freq: Every evening | ORAL | 0 refills | 30.00000 days | Status: CP
Start: 2019-05-02 — End: ?

## 2019-05-02 MED ORDER — GABAPENTIN 400 MG CAPSULE
ORAL_CAPSULE | Freq: Three times a day (TID) | ORAL | 0 refills | 30.00000 days | Status: CP
Start: 2019-05-02 — End: 2019-06-01

## 2019-05-05 ENCOUNTER — Ambulatory Visit: Admit: 2019-05-05 | Discharge: 2019-05-06 | Payer: MEDICARE

## 2019-05-05 DIAGNOSIS — M5136 Other intervertebral disc degeneration, lumbar region: Secondary | ICD-10-CM

## 2019-05-05 DIAGNOSIS — G4700 Insomnia, unspecified: Secondary | ICD-10-CM

## 2019-05-05 DIAGNOSIS — M25551 Pain in right hip: Secondary | ICD-10-CM

## 2019-05-05 DIAGNOSIS — F419 Anxiety disorder, unspecified: Secondary | ICD-10-CM

## 2019-05-05 DIAGNOSIS — G35 Multiple sclerosis: Secondary | ICD-10-CM

## 2019-05-05 DIAGNOSIS — M160 Bilateral primary osteoarthritis of hip: Secondary | ICD-10-CM

## 2019-05-05 DIAGNOSIS — R2681 Unsteadiness on feet: Secondary | ICD-10-CM

## 2019-05-05 DIAGNOSIS — S22050A Wedge compression fracture of T5-T6 vertebra, initial encounter for closed fracture: Secondary | ICD-10-CM

## 2019-05-05 DIAGNOSIS — Z981 Arthrodesis status: Secondary | ICD-10-CM

## 2019-05-05 DIAGNOSIS — M5416 Radiculopathy, lumbar region: Secondary | ICD-10-CM

## 2019-05-07 DIAGNOSIS — M5136 Other intervertebral disc degeneration, lumbar region: Secondary | ICD-10-CM

## 2019-05-07 DIAGNOSIS — R2681 Unsteadiness on feet: Secondary | ICD-10-CM

## 2019-05-07 DIAGNOSIS — M25551 Pain in right hip: Secondary | ICD-10-CM

## 2019-05-07 DIAGNOSIS — M160 Bilateral primary osteoarthritis of hip: Secondary | ICD-10-CM

## 2019-05-07 DIAGNOSIS — M533 Sacrococcygeal disorders, not elsewhere classified: Secondary | ICD-10-CM

## 2019-05-07 DIAGNOSIS — G35 Multiple sclerosis: Secondary | ICD-10-CM

## 2019-05-07 DIAGNOSIS — M5416 Radiculopathy, lumbar region: Secondary | ICD-10-CM

## 2019-05-08 ENCOUNTER — Ambulatory Visit: Admit: 2019-05-08 | Discharge: 2019-05-09 | Payer: MEDICARE | Attending: Clinical | Primary: Clinical

## 2019-05-08 DIAGNOSIS — R339 Retention of urine, unspecified: Secondary | ICD-10-CM

## 2019-05-10 DIAGNOSIS — G35 Multiple sclerosis: Secondary | ICD-10-CM

## 2019-05-12 MED ORDER — MELOXICAM 7.5 MG TABLET
ORAL_TABLET | 1 refills | 0 days | Status: CP
Start: 2019-05-12 — End: ?

## 2019-05-12 MED ORDER — BUPROPION HCL SR 150 MG TABLET,12 HR SUSTAINED-RELEASE
ORAL_TABLET | 1 refills | 0 days | Status: CP
Start: 2019-05-12 — End: ?

## 2019-05-12 MED ORDER — LOSARTAN 25 MG TABLET
ORAL_TABLET | 0 refills | 0 days | Status: CP
Start: 2019-05-12 — End: ?

## 2019-05-14 DIAGNOSIS — G4700 Insomnia, unspecified: Secondary | ICD-10-CM

## 2019-05-14 DIAGNOSIS — F419 Anxiety disorder, unspecified: Secondary | ICD-10-CM

## 2019-05-19 ENCOUNTER — Ambulatory Visit: Admit: 2019-05-19 | Discharge: 2019-05-20 | Payer: MEDICARE | Attending: Clinical | Primary: Clinical

## 2019-05-21 ENCOUNTER — Telehealth
Admit: 2019-05-21 | Discharge: 2019-05-22 | Payer: MEDICARE | Attending: Physician Assistant | Primary: Physician Assistant

## 2019-05-21 DIAGNOSIS — G35 Multiple sclerosis: Secondary | ICD-10-CM

## 2019-05-28 ENCOUNTER — Ambulatory Visit: Admit: 2019-05-28 | Discharge: 2019-05-29 | Payer: MEDICARE

## 2019-05-28 DIAGNOSIS — M25551 Pain in right hip: Secondary | ICD-10-CM

## 2019-05-28 DIAGNOSIS — M47814 Spondylosis without myelopathy or radiculopathy, thoracic region: Secondary | ICD-10-CM

## 2019-05-28 DIAGNOSIS — S22050A Wedge compression fracture of T5-T6 vertebra, initial encounter for closed fracture: Secondary | ICD-10-CM

## 2019-06-03 ENCOUNTER — Ambulatory Visit: Admit: 2019-06-03 | Discharge: 2019-06-04 | Payer: MEDICARE

## 2019-06-17 ENCOUNTER — Institutional Professional Consult (permissible substitution): Admit: 2019-06-17 | Discharge: 2019-06-18 | Payer: MEDICARE

## 2019-06-19 ENCOUNTER — Ambulatory Visit
Admit: 2019-06-19 | Discharge: 2019-06-20 | Payer: MEDICARE | Attending: Physical Medicine & Rehabilitation | Primary: Physical Medicine & Rehabilitation

## 2019-06-19 DIAGNOSIS — M533 Sacrococcygeal disorders, not elsewhere classified: Principal | ICD-10-CM

## 2019-06-19 DIAGNOSIS — M25551 Pain in right hip: Principal | ICD-10-CM

## 2019-06-19 DIAGNOSIS — M160 Bilateral primary osteoarthritis of hip: Principal | ICD-10-CM

## 2019-06-19 DIAGNOSIS — M5416 Radiculopathy, lumbar region: Principal | ICD-10-CM

## 2019-06-19 DIAGNOSIS — G35 Multiple sclerosis: Principal | ICD-10-CM

## 2019-06-19 DIAGNOSIS — R2681 Unsteadiness on feet: Principal | ICD-10-CM

## 2019-06-19 DIAGNOSIS — M5136 Other intervertebral disc degeneration, lumbar region: Principal | ICD-10-CM

## 2019-06-27 ENCOUNTER — Ambulatory Visit
Admit: 2019-06-27 | Discharge: 2019-07-26 | Payer: MEDICARE | Attending: Rehabilitative and Restorative Service Providers" | Primary: Rehabilitative and Restorative Service Providers"
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
## 2019-07-24 ENCOUNTER — Ambulatory Visit
Admit: 2019-07-24 | Discharge: 2019-07-24 | Payer: MEDICARE | Attending: Physical Medicine & Rehabilitation | Primary: Physical Medicine & Rehabilitation

## 2019-07-24 ENCOUNTER — Ambulatory Visit: Admit: 2019-07-24 | Discharge: 2019-07-24 | Payer: MEDICARE

## 2019-07-24 DIAGNOSIS — M5136 Other intervertebral disc degeneration, lumbar region: Principal | ICD-10-CM

## 2019-07-30 MED ORDER — LOSARTAN 25 MG TABLET
ORAL_TABLET | 2 refills | 0 days | Status: CP
Start: 2019-07-30 — End: ?

## 2019-08-05 DIAGNOSIS — F419 Anxiety disorder, unspecified: Principal | ICD-10-CM

## 2019-08-05 DIAGNOSIS — G4700 Insomnia, unspecified: Principal | ICD-10-CM

## 2019-08-05 MED ORDER — CLONAZEPAM 0.5 MG TABLET
ORAL_TABLET | Freq: Three times a day (TID) | ORAL | 0 refills | 30.00000 days | Status: CP | PRN
Start: 2019-08-05 — End: ?

## 2019-08-06 DIAGNOSIS — M792 Neuralgia and neuritis, unspecified: Principal | ICD-10-CM

## 2019-08-06 DIAGNOSIS — G35 Multiple sclerosis: Principal | ICD-10-CM

## 2019-08-06 MED ORDER — BACLOFEN 20 MG TABLET
ORAL_TABLET | 1 refills | 0 days | Status: CP
Start: 2019-08-06 — End: ?

## 2019-08-08 MED ORDER — CARBAMAZEPINE ER 200 MG CAPSULE,EXTENDED RELEASE MPHASE12HR
ORAL_CAPSULE | 1 refills | 0 days | Status: CP
Start: 2019-08-08 — End: ?

## 2019-08-08 MED ORDER — GABAPENTIN 300 MG CAPSULE
ORAL_CAPSULE | 1 refills | 0 days | Status: CP
Start: 2019-08-08 — End: ?

## 2019-08-18 DIAGNOSIS — G35 Multiple sclerosis: Principal | ICD-10-CM

## 2019-08-18 DIAGNOSIS — E871 Hypo-osmolality and hyponatremia: Principal | ICD-10-CM

## 2019-08-19 DIAGNOSIS — G35 Multiple sclerosis: Principal | ICD-10-CM

## 2019-08-21 ENCOUNTER — Ambulatory Visit: Admit: 2019-08-21 | Discharge: 2019-08-22 | Payer: MEDICARE

## 2019-08-21 DIAGNOSIS — E871 Hypo-osmolality and hyponatremia: Principal | ICD-10-CM

## 2019-08-21 DIAGNOSIS — G35 Multiple sclerosis: Principal | ICD-10-CM

## 2019-09-04 ENCOUNTER — Ambulatory Visit: Admit: 2019-09-04 | Discharge: 2019-09-05 | Payer: MEDICARE

## 2019-09-05 DIAGNOSIS — F419 Anxiety disorder, unspecified: Principal | ICD-10-CM

## 2019-09-05 DIAGNOSIS — G4700 Insomnia, unspecified: Principal | ICD-10-CM

## 2019-09-05 MED ORDER — CLONAZEPAM 0.5 MG TABLET
ORAL_TABLET | Freq: Three times a day (TID) | ORAL | 0 refills | 30.00000 days | Status: CP | PRN
Start: 2019-09-05 — End: ?

## 2019-09-09 ENCOUNTER — Institutional Professional Consult (permissible substitution)
Admit: 2019-09-09 | Discharge: 2019-09-10 | Payer: MEDICARE | Attending: Physician Assistant | Primary: Physician Assistant

## 2019-09-09 MED ORDER — AMITRIPTYLINE 25 MG TABLET
ORAL_TABLET | Freq: Every evening | ORAL | 0 refills | 30 days | Status: CP | PRN
Start: 2019-09-09 — End: 2019-10-09

## 2019-09-10 MED ORDER — AMITRIPTYLINE 25 MG TABLET
ORAL_TABLET | Freq: Every evening | ORAL | 0 refills | 30.00000 days | Status: CP | PRN
Start: 2019-09-10 — End: 2019-10-10

## 2019-09-12 MED ORDER — AMITRIPTYLINE 25 MG TABLET
ORAL_TABLET | Freq: Every evening | ORAL | 0 refills | 30 days | Status: CP | PRN
Start: 2019-09-12 — End: 2019-10-12

## 2019-09-22 ENCOUNTER — Ambulatory Visit: Payer: Medicare Other

## 2019-09-26 ENCOUNTER — Telehealth: Admit: 2019-09-26 | Discharge: 2019-09-27 | Payer: MEDICARE | Attending: Family Medicine | Primary: Family Medicine

## 2019-09-26 DIAGNOSIS — D7589 Other specified diseases of blood and blood-forming organs: Principal | ICD-10-CM

## 2019-09-26 DIAGNOSIS — Z79899 Other long term (current) drug therapy: Principal | ICD-10-CM

## 2019-09-26 DIAGNOSIS — D649 Anemia, unspecified: Principal | ICD-10-CM

## 2019-09-26 DIAGNOSIS — E871 Hypo-osmolality and hyponatremia: Principal | ICD-10-CM

## 2019-09-27 ENCOUNTER — Ambulatory Visit: Admit: 2019-09-27 | Discharge: 2019-09-30 | Disposition: A | Discharge status: Home / Self Care | Payer: MEDICARE

## 2019-09-30 MED ORDER — CLINDAMYCIN HCL 300 MG CAPSULE
ORAL_CAPSULE | Freq: Four times a day (QID) | ORAL | 0 refills | 2 days | Status: CP
Start: 2019-09-30 — End: 2019-10-02

## 2019-09-30 MED ORDER — OXYCODONE 5 MG TABLET
ORAL_TABLET | ORAL | 0 refills | 1 days | Status: CP | PRN
Start: 2019-09-30 — End: ?

## 2019-10-01 ENCOUNTER — Emergency Department: Admit: 2019-10-01 | Discharge: 2019-10-02 | Disposition: A | Discharge status: Home / Self Care | Payer: MEDICARE

## 2019-10-01 ENCOUNTER — Ambulatory Visit: Admit: 2019-10-01 | Discharge: 2019-10-02 | Disposition: A | Discharge status: Home / Self Care | Payer: MEDICARE

## 2019-10-01 DIAGNOSIS — E871 Hypo-osmolality and hyponatremia: Principal | ICD-10-CM

## 2019-10-01 DIAGNOSIS — G4700 Insomnia, unspecified: Principal | ICD-10-CM

## 2019-10-01 DIAGNOSIS — W19XXXA Unspecified fall, initial encounter: Principal | ICD-10-CM

## 2019-10-01 DIAGNOSIS — F419 Anxiety disorder, unspecified: Principal | ICD-10-CM

## 2019-10-07 DIAGNOSIS — F419 Anxiety disorder, unspecified: Principal | ICD-10-CM

## 2019-10-07 DIAGNOSIS — G4700 Insomnia, unspecified: Principal | ICD-10-CM

## 2019-10-08 MED ORDER — CLONAZEPAM 0.5 MG TABLET
ORAL_TABLET | Freq: Two times a day (BID) | ORAL | 0 refills | 30.00000 days | Status: CP | PRN
Start: 2019-10-08 — End: ?

## 2019-10-13 ENCOUNTER — Ambulatory Visit: Admit: 2019-10-13 | Discharge: 2019-10-14 | Payer: MEDICARE | Attending: Internal Medicine | Primary: Internal Medicine

## 2019-10-13 DIAGNOSIS — M272 Inflammatory conditions of jaws: Principal | ICD-10-CM

## 2019-10-13 DIAGNOSIS — M8668 Other chronic osteomyelitis, other site: Principal | ICD-10-CM

## 2019-10-13 DIAGNOSIS — D649 Anemia, unspecified: Principal | ICD-10-CM

## 2019-10-13 DIAGNOSIS — E871 Hypo-osmolality and hyponatremia: Principal | ICD-10-CM

## 2019-10-13 DIAGNOSIS — Z79899 Other long term (current) drug therapy: Principal | ICD-10-CM

## 2019-10-13 DIAGNOSIS — D7589 Other specified diseases of blood and blood-forming organs: Principal | ICD-10-CM

## 2019-10-13 MED ORDER — AMOXICILLIN 875 MG-POTASSIUM CLAVULANATE 125 MG TABLET
ORAL_TABLET | Freq: Two times a day (BID) | ORAL | 0 refills | 42.00000 days | Status: CP
Start: 2019-10-13 — End: 2019-11-24

## 2019-10-17 ENCOUNTER — Telehealth: Admit: 2019-10-17 | Discharge: 2019-10-18 | Payer: MEDICARE | Attending: Rheumatology | Primary: Rheumatology

## 2019-10-22 ENCOUNTER — Telehealth
Admit: 2019-10-22 | Discharge: 2019-10-23 | Payer: MEDICARE | Attending: Physician Assistant | Primary: Physician Assistant

## 2019-10-24 ENCOUNTER — Ambulatory Visit: Admit: 2019-10-24 | Discharge: 2019-10-25 | Payer: MEDICARE

## 2019-10-28 DIAGNOSIS — I10 Essential (primary) hypertension: Principal | ICD-10-CM

## 2019-10-29 ENCOUNTER — Ambulatory Visit: Admit: 2019-10-29 | Discharge: 2019-10-29 | Disposition: A | Discharge status: Home / Self Care | Payer: MEDICARE

## 2019-10-31 ENCOUNTER — Ambulatory Visit: Admit: 2019-10-31 | Discharge: 2019-11-01 | Payer: MEDICARE | Attending: Family Medicine | Primary: Family Medicine

## 2019-10-31 DIAGNOSIS — E871 Hypo-osmolality and hyponatremia: Principal | ICD-10-CM

## 2019-10-31 DIAGNOSIS — G35 Multiple sclerosis: Principal | ICD-10-CM

## 2019-10-31 DIAGNOSIS — Z0000 Encounter for general adult medical examination without abnormal findings: Principal | ICD-10-CM

## 2019-10-31 DIAGNOSIS — I10 Essential (primary) hypertension: Principal | ICD-10-CM

## 2019-10-31 MED ORDER — CARBAMAZEPINE ER 200 MG CAPSULE,EXTENDED RELEASE MPHASE12HR
ORAL_CAPSULE | 0 refills | 0 days
Start: 2019-10-31 — End: ?

## 2019-11-05 MED ORDER — HYDRALAZINE 25 MG TABLET
ORAL_TABLET | Freq: Every day | ORAL | 0 refills | 30 days | Status: CP | PRN
Start: 2019-11-05 — End: 2020-11-04

## 2019-11-06 MED ORDER — CLONAZEPAM 0.25 MG DISINTEGRATING TABLET
ORAL_TABLET | Freq: Three times a day (TID) | ORAL | 1 refills | 30 days | Status: CP | PRN
Start: 2019-11-06 — End: ?

## 2019-11-13 ENCOUNTER — Telehealth
Admit: 2019-11-13 | Discharge: 2019-11-14 | Payer: MEDICARE | Attending: Physician Assistant | Primary: Physician Assistant

## 2019-11-19 MED ORDER — ROSUVASTATIN 5 MG TABLET
ORAL_TABLET | Freq: Every evening | ORAL | 5 refills | 30 days | Status: CP
Start: 2019-11-19 — End: 2020-05-17

## 2019-11-19 MED ORDER — TRIAMCINOLONE ACETONIDE 0.5 % TOPICAL CREAM
Freq: Three times a day (TID) | TOPICAL | 0 refills | 0 days | Status: CP
Start: 2019-11-19 — End: 2020-11-18

## 2019-11-26 ENCOUNTER — Ambulatory Visit: Admit: 2019-11-26 | Discharge: 2019-11-27 | Payer: MEDICARE

## 2019-11-26 DIAGNOSIS — E871 Hypo-osmolality and hyponatremia: Principal | ICD-10-CM

## 2019-11-26 DIAGNOSIS — G63 Polyneuropathy in diseases classified elsewhere: Principal | ICD-10-CM

## 2019-11-26 DIAGNOSIS — M8668 Other chronic osteomyelitis, other site: Principal | ICD-10-CM

## 2019-11-26 DIAGNOSIS — L409 Psoriasis, unspecified: Principal | ICD-10-CM

## 2019-11-26 DIAGNOSIS — G35 Multiple sclerosis: Principal | ICD-10-CM

## 2019-11-26 MED ORDER — BETAMETHASONE, AUGMENTED 0.05 % TOPICAL CREAM
Freq: Two times a day (BID) | TOPICAL | 1 refills | 0.00000 days | Status: CP
Start: 2019-11-26 — End: 2020-11-25

## 2019-11-29 DIAGNOSIS — G35 Multiple sclerosis: Principal | ICD-10-CM

## 2019-12-03 ENCOUNTER — Ambulatory Visit: Admit: 2019-12-03 | Discharge: 2019-12-04 | Payer: MEDICARE

## 2019-12-09 MED ORDER — CLONAZEPAM 0.5 MG TABLET
ORAL_TABLET | 2 refills | 0 days | Status: CP
Start: 2019-12-09 — End: ?

## 2019-12-16 ENCOUNTER — Ambulatory Visit: Admit: 2019-12-16 | Discharge: 2019-12-17 | Payer: MEDICARE

## 2019-12-16 DIAGNOSIS — L309 Dermatitis, unspecified: Principal | ICD-10-CM

## 2019-12-16 DIAGNOSIS — D492 Neoplasm of unspecified behavior of bone, soft tissue, and skin: Principal | ICD-10-CM

## 2019-12-16 DIAGNOSIS — L719 Rosacea, unspecified: Principal | ICD-10-CM

## 2019-12-16 MED ORDER — BETAMETHASONE DIPROPIONATE 0.05 % TOPICAL OINTMENT
5 refills | 0 days | Status: CP
Start: 2019-12-16 — End: ?

## 2019-12-16 MED ORDER — METRONIDAZOLE 0.75 % TOPICAL CREAM
11 refills | 0 days | Status: CP
Start: 2019-12-16 — End: ?

## 2019-12-19 ENCOUNTER — Ambulatory Visit: Admit: 2019-12-19 | Discharge: 2019-12-20 | Payer: MEDICARE

## 2019-12-19 DIAGNOSIS — G35 Multiple sclerosis: Principal | ICD-10-CM

## 2019-12-19 DIAGNOSIS — I10 Essential (primary) hypertension: Principal | ICD-10-CM

## 2019-12-19 MED ORDER — TELMISARTAN 40 MG TABLET
ORAL_TABLET | Freq: Every day | ORAL | 3 refills | 60.00000 days | Status: CP
Start: 2019-12-19 — End: 2020-12-18

## 2019-12-23 ENCOUNTER — Ambulatory Visit: Admit: 2019-12-23 | Discharge: 2019-12-24 | Payer: MEDICARE

## 2019-12-23 DIAGNOSIS — H532 Diplopia: Principal | ICD-10-CM

## 2019-12-25 ENCOUNTER — Ambulatory Visit: Admit: 2019-12-25 | Discharge: 2019-12-26 | Payer: MEDICARE

## 2019-12-25 DIAGNOSIS — G35 Multiple sclerosis: Principal | ICD-10-CM

## 2019-12-30 ENCOUNTER — Ambulatory Visit
Admit: 2019-12-30 | Discharge: 2019-12-31 | Payer: MEDICARE | Attending: Physical Medicine & Rehabilitation | Primary: Physical Medicine & Rehabilitation

## 2020-01-01 DIAGNOSIS — R21 Rash and other nonspecific skin eruption: Principal | ICD-10-CM

## 2020-01-01 DIAGNOSIS — M7138 Other bursal cyst, other site: Principal | ICD-10-CM

## 2020-01-01 DIAGNOSIS — G8929 Other chronic pain: Principal | ICD-10-CM

## 2020-01-01 DIAGNOSIS — M5442 Lumbago with sciatica, left side: Principal | ICD-10-CM

## 2020-01-01 DIAGNOSIS — S300XXD Contusion of lower back and pelvis, subsequent encounter: Principal | ICD-10-CM

## 2020-01-01 MED ORDER — BETAMETHASONE DIPROPIONATE 0.05 % TOPICAL CREAM
Freq: Two times a day (BID) | TOPICAL | 5 refills | 0.00000 days | Status: CP
Start: 2020-01-01 — End: 2020-12-31

## 2020-01-05 ENCOUNTER — Telehealth
Admit: 2020-01-05 | Discharge: 2020-01-06 | Payer: MEDICARE | Attending: Physician Assistant | Primary: Physician Assistant

## 2020-01-05 DIAGNOSIS — G35 Multiple sclerosis: Principal | ICD-10-CM

## 2020-01-05 MED ORDER — BACLOFEN 20 MG TABLET
ORAL_TABLET | 1 refills | 0 days
Start: 2020-01-05 — End: ?

## 2020-01-13 MED ORDER — DIAZEPAM 5 MG TABLET
ORAL_TABLET | 0 refills | 0 days | Status: CP
Start: 2020-01-13 — End: ?

## 2020-01-23 ENCOUNTER — Ambulatory Visit: Admit: 2020-01-23 | Discharge: 2020-01-24 | Payer: MEDICARE | Attending: Pharmacotherapy | Primary: Pharmacotherapy

## 2020-01-23 DIAGNOSIS — I10 Essential (primary) hypertension: Principal | ICD-10-CM

## 2020-01-23 MED ORDER — AMLODIPINE 5 MG TABLET
ORAL_TABLET | Freq: Every day | ORAL | 6 refills | 30 days | Status: CP
Start: 2020-01-23 — End: 2020-02-22

## 2020-01-24 MED ORDER — OMEPRAZOLE 20 MG CAPSULE,DELAYED RELEASE
ORAL_CAPSULE | Freq: Every day | ORAL | 0 refills | 90.00000 days | Status: CP
Start: 2020-01-24 — End: ?

## 2020-01-26 ENCOUNTER — Ambulatory Visit: Admit: 2020-01-26 | Discharge: 2020-01-27 | Payer: MEDICARE

## 2020-01-26 ENCOUNTER — Ambulatory Visit
Admit: 2020-01-26 | Discharge: 2020-01-27 | Payer: MEDICARE | Attending: Physical Medicine & Rehabilitation | Primary: Physical Medicine & Rehabilitation

## 2020-01-26 DIAGNOSIS — M47817 Spondylosis without myelopathy or radiculopathy, lumbosacral region: Principal | ICD-10-CM

## 2020-01-30 ENCOUNTER — Ambulatory Visit: Admit: 2020-01-30 | Discharge: 2020-01-31 | Payer: MEDICARE | Attending: Pharmacotherapy | Primary: Pharmacotherapy

## 2020-01-30 ENCOUNTER — Ambulatory Visit: Admit: 2020-01-30 | Discharge: 2020-01-31 | Payer: MEDICARE

## 2020-02-02 ENCOUNTER — Ambulatory Visit: Admit: 2020-02-02 | Discharge: 2020-02-03 | Payer: MEDICARE | Attending: Family Medicine | Primary: Family Medicine

## 2020-02-02 DIAGNOSIS — E871 Hypo-osmolality and hyponatremia: Principal | ICD-10-CM

## 2020-02-02 DIAGNOSIS — F329 Major depressive disorder, single episode, unspecified: Principal | ICD-10-CM

## 2020-02-02 DIAGNOSIS — G35 Multiple sclerosis: Principal | ICD-10-CM

## 2020-02-02 DIAGNOSIS — F419 Anxiety disorder, unspecified: Secondary | ICD-10-CM

## 2020-02-02 DIAGNOSIS — S300XXD Contusion of lower back and pelvis, subsequent encounter: Principal | ICD-10-CM

## 2020-02-02 DIAGNOSIS — I679 Cerebrovascular disease, unspecified: Principal | ICD-10-CM

## 2020-02-02 MED ORDER — BUPROPION HCL SR 100 MG TABLET,12 HR SUSTAINED-RELEASE
Freq: Two times a day (BID) | ORAL | 1 refills | 90 days | Status: CP
Start: 2020-02-02 — End: ?

## 2020-02-02 MED ORDER — ROSUVASTATIN 5 MG TABLET
ORAL_TABLET | ORAL | 1 refills | 90.00000 days | Status: CP
Start: 2020-02-02 — End: 2020-07-31

## 2020-02-06 ENCOUNTER — Ambulatory Visit: Admit: 2020-02-06 | Discharge: 2020-02-07 | Payer: MEDICARE

## 2020-02-06 DIAGNOSIS — E871 Hypo-osmolality and hyponatremia: Principal | ICD-10-CM

## 2020-02-12 ENCOUNTER — Ambulatory Visit: Admit: 2020-02-12 | Discharge: 2020-02-12 | Payer: MEDICARE

## 2020-02-12 DIAGNOSIS — S300XXD Contusion of lower back and pelvis, subsequent encounter: Principal | ICD-10-CM

## 2020-02-13 IMAGING — CR DG HIP (WITH OR WITHOUT PELVIS) 2-3V*R*
1 series · 3 of 3 positions shown · non-contrast
Comparison: None.

CLINICAL DATA: Fall 4 days ago. Right hip pain. Initial encounter.

EXAM:
DG HIP (WITH OR WITHOUT PELVIS) 2-3V RIGHT

[Series 1: dg hip unilat w or w/o pelvis 2-3 views  · non-contrast · 0.14mm/px · 3 of 3 slices shown]
[im 1/3]
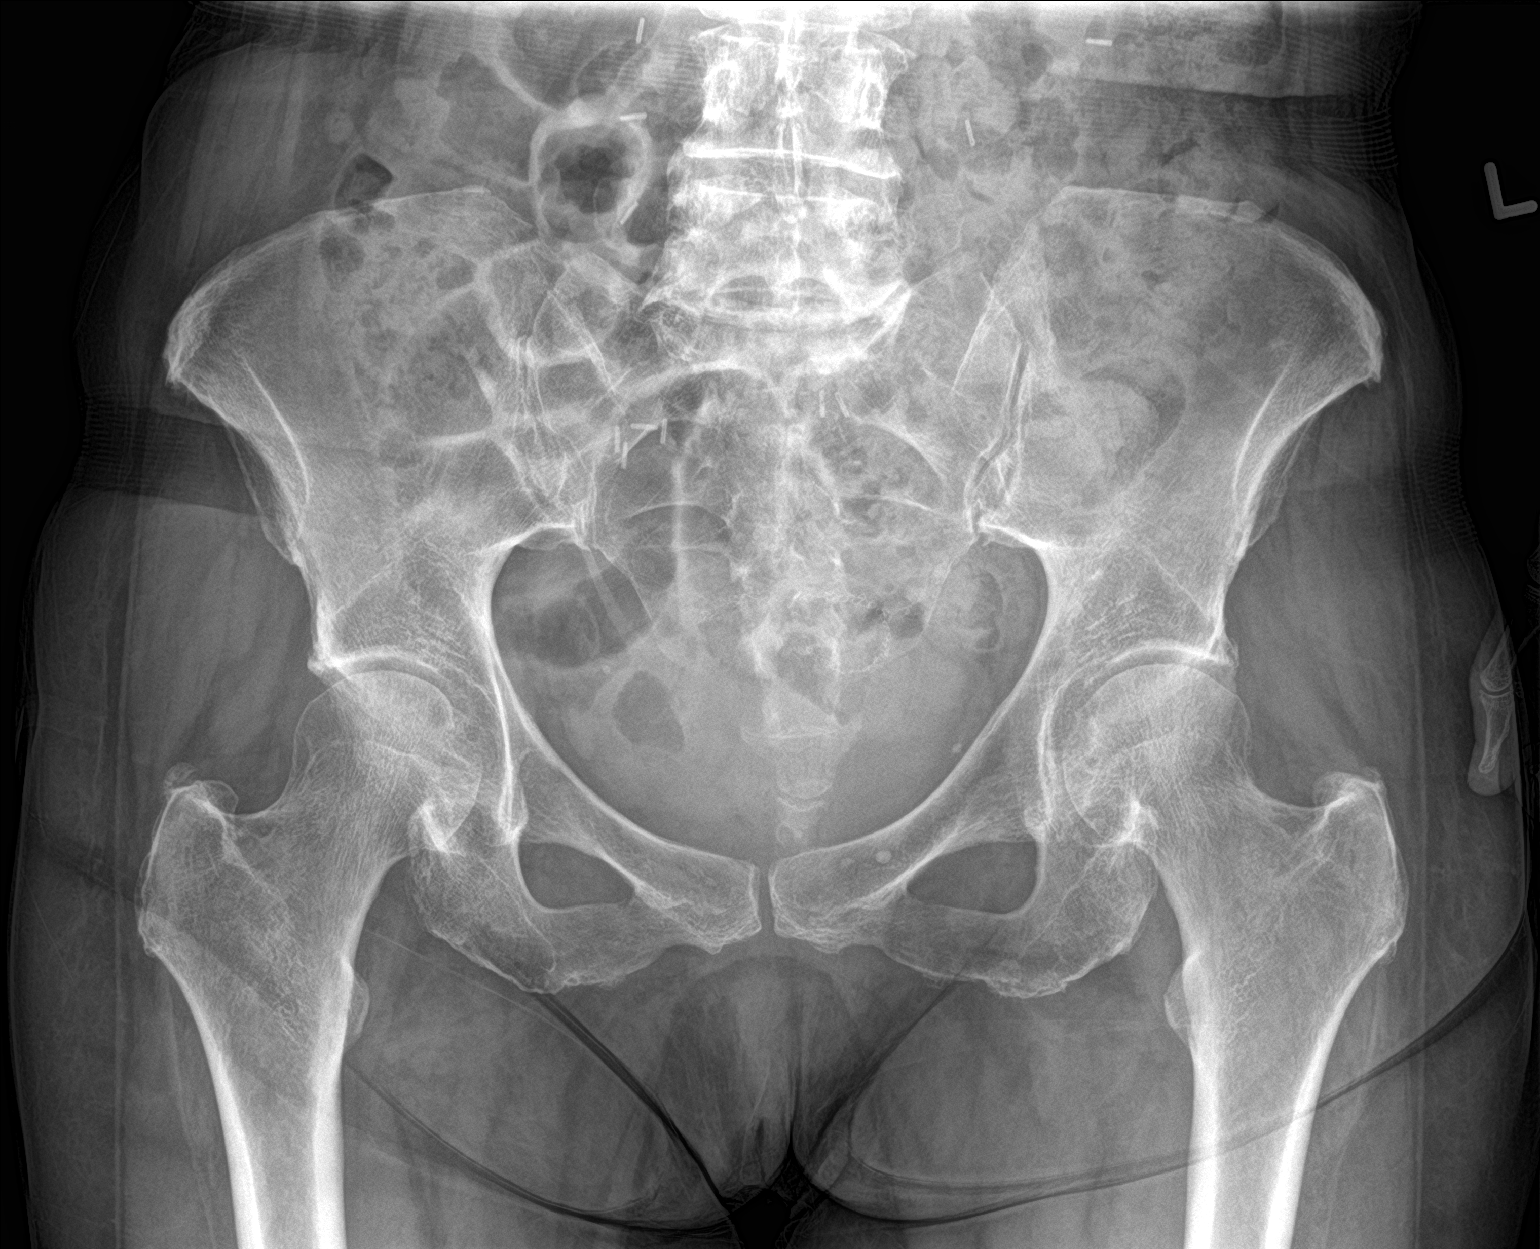
[im 2/3]
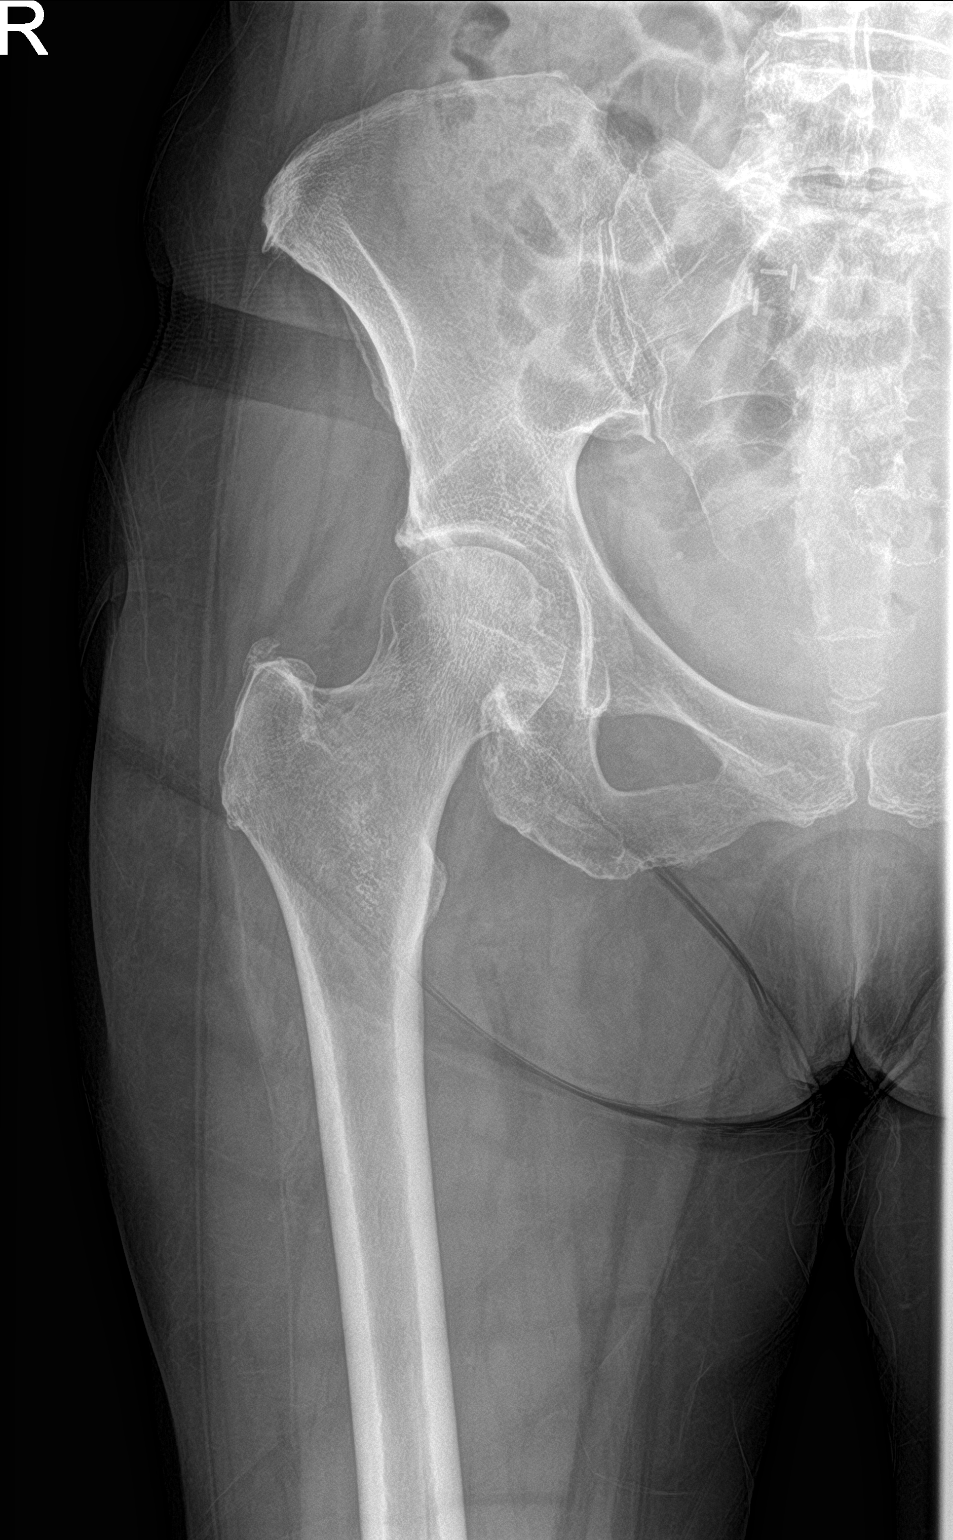
[im 3/3]
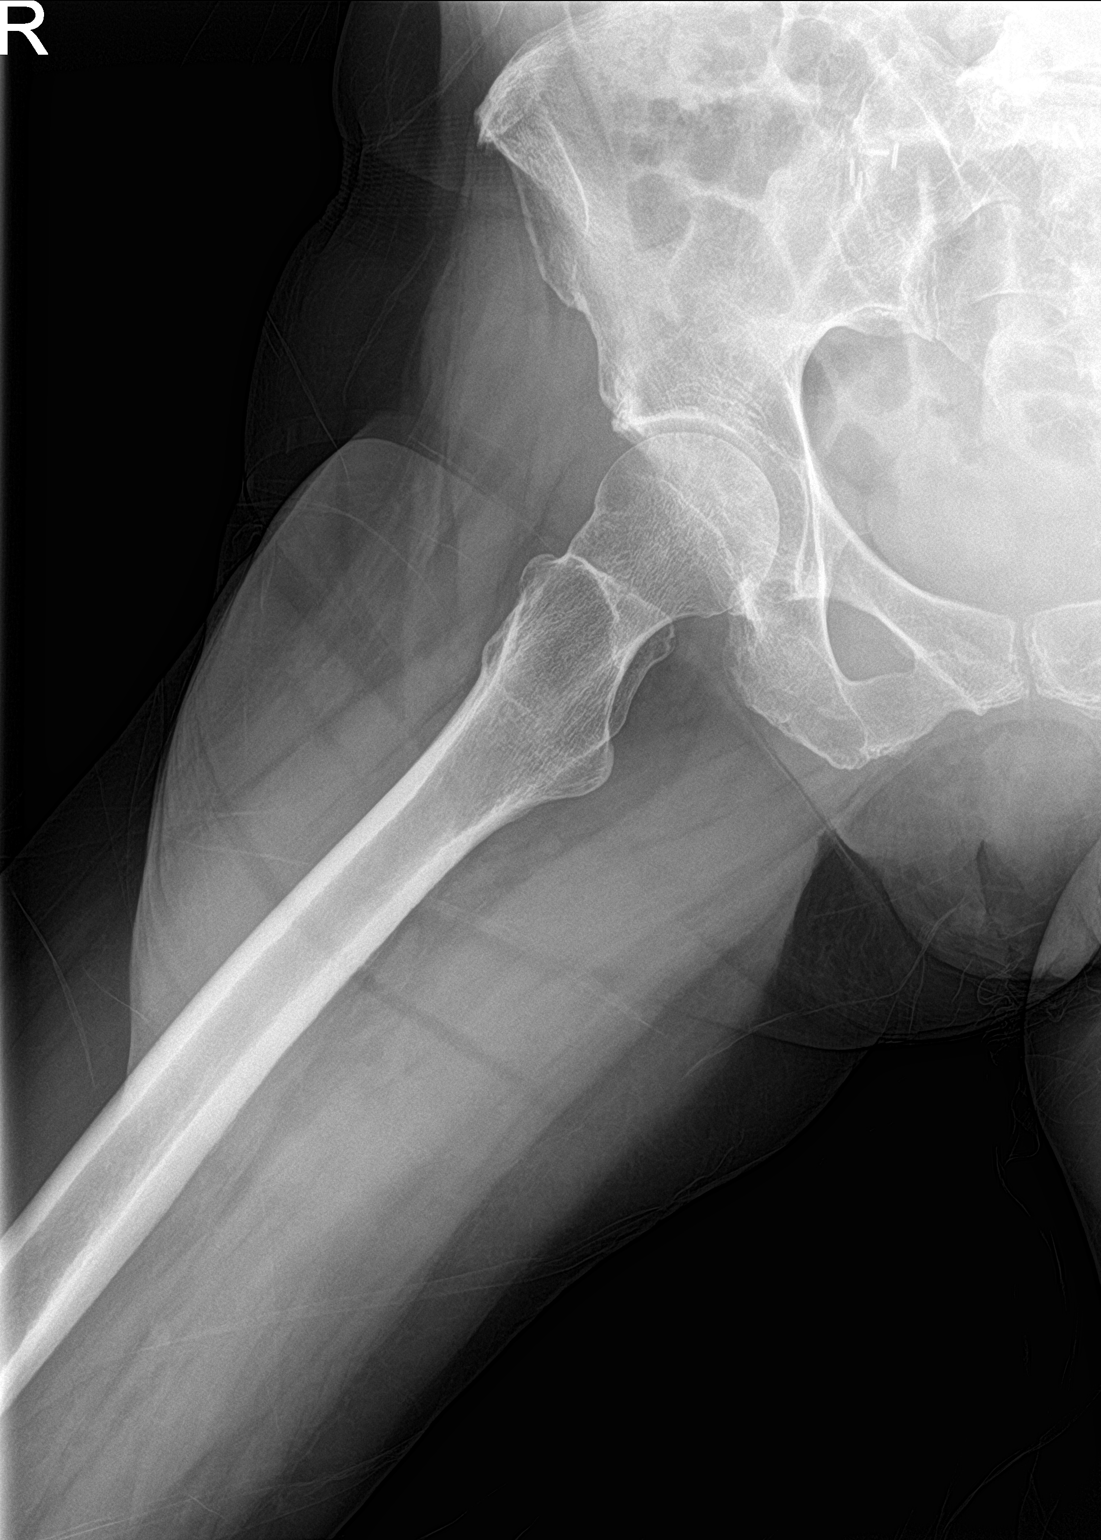

[3 of 3 positions shown; findings below may reference images not displayed]

FINDINGS: There is no evidence of hip fracture or dislocation. There is no
evidence of arthropathy or other focal bone abnormality.
IMPRESSION: Negative.

## 2020-02-29 MED ORDER — OXYCODONE 5 MG TABLET
Freq: Four times a day (QID) | ORAL | 0.00000 days | PRN
Start: 2020-02-29 — End: ?

## 2020-02-29 MED ORDER — VALACYCLOVIR 1 GRAM TABLET
ORAL | 0.00000 days
Start: 2020-02-29 — End: ?

## 2020-03-01 MED ORDER — CLONAZEPAM 0.5 MG TABLET
ORAL_TABLET | 2 refills | 0 days | Status: CP
Start: 2020-03-01 — End: ?

## 2020-03-03 ENCOUNTER — Ambulatory Visit: Admit: 2020-03-03 | Discharge: 2020-03-04 | Payer: MEDICARE

## 2020-03-03 DIAGNOSIS — R079 Chest pain, unspecified: Principal | ICD-10-CM

## 2020-03-03 DIAGNOSIS — B029 Zoster without complications: Principal | ICD-10-CM

## 2020-03-03 DIAGNOSIS — G35 Multiple sclerosis: Principal | ICD-10-CM

## 2020-03-05 ENCOUNTER — Telehealth: Admit: 2020-03-05 | Discharge: 2020-03-06 | Payer: MEDICARE

## 2020-03-05 DIAGNOSIS — E871 Hypo-osmolality and hyponatremia: Principal | ICD-10-CM

## 2020-03-05 DIAGNOSIS — I10 Essential (primary) hypertension: Principal | ICD-10-CM

## 2020-03-05 DIAGNOSIS — G35 Multiple sclerosis: Principal | ICD-10-CM

## 2020-03-05 MED ORDER — TELMISARTAN 40 MG TABLET
ORAL_TABLET | Freq: Every day | ORAL | 3 refills | 90.00000 days | Status: CP
Start: 2020-03-05 — End: 2021-03-05

## 2020-03-05 MED ORDER — AMLODIPINE 2.5 MG TABLET
ORAL_TABLET | Freq: Every day | ORAL | 3 refills | 90.00000 days | Status: CP
Start: 2020-03-05 — End: 2020-04-04

## 2020-03-07 DIAGNOSIS — E871 Hypo-osmolality and hyponatremia: Principal | ICD-10-CM

## 2020-03-11 MED ORDER — GABAPENTIN 300 MG CAPSULE
ORAL_CAPSULE | Freq: Three times a day (TID) | ORAL | 1 refills | 90.00000 days | Status: CP
Start: 2020-03-11 — End: ?

## 2020-04-06 ENCOUNTER — Telehealth
Admit: 2020-04-06 | Discharge: 2020-04-07 | Payer: MEDICARE | Attending: Physical Medicine & Rehabilitation | Primary: Physical Medicine & Rehabilitation

## 2020-04-06 MED ORDER — TIZANIDINE 4 MG TABLET
ORAL_TABLET | Freq: Three times a day (TID) | ORAL | 2 refills | 30.00000 days | Status: CP | PRN
Start: 2020-04-06 — End: ?

## 2020-04-06 MED ORDER — METHYLPREDNISOLONE 4 MG TABLETS IN A DOSE PACK
0 refills | 0 days | Status: CP
Start: 2020-04-06 — End: ?

## 2020-04-07 ENCOUNTER — Ambulatory Visit: Admit: 2020-04-07 | Discharge: 2020-04-08 | Payer: MEDICARE | Attending: Family Medicine | Primary: Family Medicine

## 2020-04-07 DIAGNOSIS — F329 Major depressive disorder, single episode, unspecified: Principal | ICD-10-CM

## 2020-04-07 DIAGNOSIS — F419 Anxiety disorder, unspecified: Principal | ICD-10-CM

## 2020-04-07 DIAGNOSIS — I10 Essential (primary) hypertension: Principal | ICD-10-CM

## 2020-04-08 ENCOUNTER — Telehealth: Admit: 2020-04-08 | Discharge: 2020-04-09 | Payer: MEDICARE

## 2020-04-08 DIAGNOSIS — F329 Major depressive disorder, single episode, unspecified: Principal | ICD-10-CM

## 2020-04-08 DIAGNOSIS — F419 Anxiety disorder, unspecified: Principal | ICD-10-CM

## 2020-04-08 DIAGNOSIS — F411 Generalized anxiety disorder: Principal | ICD-10-CM

## 2020-04-08 DIAGNOSIS — G35 Multiple sclerosis: Principal | ICD-10-CM

## 2020-04-08 MED ORDER — CLONAZEPAM 0.5 MG TABLET
ORAL_TABLET | 2 refills | 0 days | Status: CP
Start: 2020-04-08 — End: ?

## 2020-04-21 MED ORDER — CLONAZEPAM 0.5 MG TABLET
ORAL_TABLET | ORAL | 2 refills | 0.00000 days | Status: CP
Start: 2020-04-21 — End: ?

## 2020-04-21 MED ORDER — CLONAZEPAM 0.5 MG TABLET: tablet | 2 refills | 0 days | Status: AC

## 2020-04-22 MED ORDER — CLONAZEPAM 0.5 MG TABLET
ORAL_TABLET | 2 refills | 0 days | Status: CP
Start: 2020-04-22 — End: ?

## 2020-04-27 ENCOUNTER — Ambulatory Visit: Admit: 2020-04-27 | Discharge: 2020-04-28 | Payer: MEDICARE

## 2020-04-27 DIAGNOSIS — R05 Cough: Principal | ICD-10-CM

## 2020-04-27 DIAGNOSIS — R0989 Other specified symptoms and signs involving the circulatory and respiratory systems: Principal | ICD-10-CM

## 2020-04-27 DIAGNOSIS — H6692 Otitis media, unspecified, left ear: Principal | ICD-10-CM

## 2020-04-27 MED ORDER — CEFDINIR 300 MG CAPSULE
ORAL_CAPSULE | Freq: Two times a day (BID) | ORAL | 0 refills | 10.00000 days | Status: CP
Start: 2020-04-27 — End: 2020-05-07

## 2020-05-03 ENCOUNTER — Ambulatory Visit: Admit: 2020-05-03 | Discharge: 2020-05-04 | Payer: MEDICARE

## 2020-05-08 MED ORDER — ROSUVASTATIN 5 MG TABLET
ORAL_TABLET | 1 refills | 0 days | Status: CP
Start: 2020-05-08 — End: ?

## 2020-05-14 ENCOUNTER — Ambulatory Visit: Admit: 2020-05-14 | Discharge: 2020-05-15 | Payer: MEDICARE

## 2020-05-14 DIAGNOSIS — H6982 Other specified disorders of Eustachian tube, left ear: Principal | ICD-10-CM

## 2020-05-14 MED ORDER — PREDNISONE 20 MG TABLET
ORAL_TABLET | ORAL | 0 refills | 0.00000 days | Status: CP
Start: 2020-05-14 — End: ?

## 2020-05-14 MED ORDER — FLUTICASONE PROPIONATE 50 MCG/ACTUATION NASAL SPRAY,SUSPENSION
Freq: Every day | NASAL | 5 refills | 0 days | Status: CP
Start: 2020-05-14 — End: 2021-05-14

## 2020-05-17 DIAGNOSIS — H6980 Other specified disorders of Eustachian tube, unspecified ear: Principal | ICD-10-CM

## 2020-05-26 ENCOUNTER — Other Ambulatory Visit: Payer: Self-pay | Admitting: Physician Assistant

## 2020-05-26 DIAGNOSIS — H9202 Otalgia, left ear: Secondary | ICD-10-CM

## 2020-05-26 DIAGNOSIS — G35 Multiple sclerosis: Principal | ICD-10-CM

## 2020-05-26 MED ORDER — BACLOFEN 20 MG TABLET
ORAL_TABLET | 1 refills | 0 days
Start: 2020-05-26 — End: ?

## 2020-05-27 MED ORDER — BACLOFEN 20 MG TABLET
ORAL_TABLET | 1 refills | 0 days | Status: CP
Start: 2020-05-27 — End: ?

## 2020-05-31 ENCOUNTER — Ambulatory Visit: Admit: 2020-05-31 | Discharge: 2020-05-31 | Payer: MEDICARE

## 2020-05-31 ENCOUNTER — Ambulatory Visit
Admit: 2020-05-31 | Discharge: 2020-05-31 | Payer: MEDICARE | Attending: Physical Medicine & Rehabilitation | Primary: Physical Medicine & Rehabilitation

## 2020-05-31 DIAGNOSIS — M5136 Other intervertebral disc degeneration, lumbar region: Principal | ICD-10-CM

## 2020-05-31 DIAGNOSIS — M869 Osteomyelitis, unspecified: Principal | ICD-10-CM

## 2020-06-04 ENCOUNTER — Ambulatory Visit: Admit: 2020-06-04 | Discharge: 2020-06-05 | Payer: MEDICARE

## 2020-06-04 DIAGNOSIS — G35 Multiple sclerosis: Principal | ICD-10-CM

## 2020-06-04 DIAGNOSIS — E871 Hypo-osmolality and hyponatremia: Principal | ICD-10-CM

## 2020-06-04 DIAGNOSIS — I10 Essential (primary) hypertension: Principal | ICD-10-CM

## 2020-06-08 ENCOUNTER — Ambulatory Visit: Payer: Medicare Other

## 2020-06-25 ENCOUNTER — Telehealth: Admit: 2020-06-25 | Discharge: 2020-06-26 | Payer: MEDICARE | Attending: Pharmacotherapy | Primary: Pharmacotherapy

## 2020-06-25 DIAGNOSIS — I10 Essential (primary) hypertension: Principal | ICD-10-CM

## 2020-06-25 MED ORDER — AMLODIPINE 5 MG TABLET
ORAL_TABLET | Freq: Every day | ORAL | 5 refills | 30.00000 days | Status: CP
Start: 2020-06-25 — End: 2020-07-25

## 2020-06-25 MED ORDER — TELMISARTAN 80 MG TABLET
ORAL_TABLET | Freq: Every day | ORAL | 5 refills | 30.00000 days | Status: CP
Start: 2020-06-25 — End: 2020-12-22

## 2020-06-27 DIAGNOSIS — G35 Multiple sclerosis: Principal | ICD-10-CM

## 2020-06-28 ENCOUNTER — Ambulatory Visit: Admit: 2020-06-28 | Discharge: 2020-06-29 | Payer: MEDICARE

## 2020-06-28 DIAGNOSIS — I10 Essential (primary) hypertension: Principal | ICD-10-CM

## 2020-06-28 MED ORDER — BUPROPION HCL SR 100 MG TABLET,12 HR SUSTAINED-RELEASE
ORAL_TABLET | 0 refills | 0 days | Status: CP
Start: 2020-06-28 — End: 2020-09-22

## 2020-07-08 ENCOUNTER — Telehealth: Admit: 2020-07-08 | Discharge: 2020-07-09 | Payer: MEDICARE

## 2020-07-08 DIAGNOSIS — F32A Depression, unspecified depression type: Principal | ICD-10-CM

## 2020-07-08 DIAGNOSIS — F411 Generalized anxiety disorder: Principal | ICD-10-CM

## 2020-07-08 DIAGNOSIS — G35 Multiple sclerosis: Principal | ICD-10-CM

## 2020-07-08 MED ORDER — CLONAZEPAM 0.5 MG TABLET
ORAL_TABLET | Freq: Three times a day (TID) | ORAL | 2 refills | 30.00000 days | Status: CP | PRN
Start: 2020-07-08 — End: 2020-07-09

## 2020-07-09 MED ORDER — CLONAZEPAM 0.5 MG TABLET
ORAL_TABLET | Freq: Three times a day (TID) | ORAL | 2 refills | 30.00000 days | Status: CP | PRN
Start: 2020-07-09 — End: 2020-07-20

## 2020-07-20 ENCOUNTER — Ambulatory Visit
Admit: 2020-07-20 | Discharge: 2020-07-21 | Payer: MEDICARE | Attending: Physical Medicine & Rehabilitation | Primary: Physical Medicine & Rehabilitation

## 2020-07-20 DIAGNOSIS — M545 Chronic midline low back pain, unspecified whether sciatica present: Principal | ICD-10-CM

## 2020-07-20 DIAGNOSIS — R29898 Other symptoms and signs involving the musculoskeletal system: Principal | ICD-10-CM

## 2020-07-20 DIAGNOSIS — M5136 Other intervertebral disc degeneration, lumbar region: Principal | ICD-10-CM

## 2020-07-20 DIAGNOSIS — Z9889 Other specified postprocedural states: Principal | ICD-10-CM

## 2020-07-20 DIAGNOSIS — M4802 Spinal stenosis, cervical region: Principal | ICD-10-CM

## 2020-07-20 DIAGNOSIS — M47816 Spondylosis without myelopathy or radiculopathy, lumbar region: Principal | ICD-10-CM

## 2020-07-20 DIAGNOSIS — G8929 Other chronic pain: Principal | ICD-10-CM

## 2020-07-20 DIAGNOSIS — M79605 Pain in left leg: Principal | ICD-10-CM

## 2020-07-20 MED ORDER — METHOCARBAMOL 500 MG TABLET
ORAL_TABLET | 2 refills | 0 days | Status: CP
Start: 2020-07-20 — End: 2020-10-11

## 2020-07-20 MED ORDER — PREDNISONE 10 MG TABLET
ORAL_TABLET | ORAL | 0 refills | 12.00000 days | Status: CP
Start: 2020-07-20 — End: 2020-08-01

## 2020-07-20 MED ORDER — CLONAZEPAM 0.5 MG TABLET
ORAL_TABLET | Freq: Three times a day (TID) | ORAL | 2 refills | 30 days | Status: CP | PRN
Start: 2020-07-20 — End: ?

## 2020-07-30 ENCOUNTER — Ambulatory Visit: Admit: 2020-07-30 | Discharge: 2020-07-31 | Payer: MEDICARE | Attending: Neurology | Primary: Neurology

## 2020-07-30 DIAGNOSIS — G35 Multiple sclerosis: Principal | ICD-10-CM

## 2020-07-30 DIAGNOSIS — Z6821 Body mass index (BMI) 21.0-21.9, adult: Principal | ICD-10-CM

## 2020-08-03 ENCOUNTER — Ambulatory Visit: Admit: 2020-08-03 | Discharge: 2020-08-04 | Payer: MEDICARE

## 2020-08-03 DIAGNOSIS — Z9889 Other specified postprocedural states: Principal | ICD-10-CM

## 2020-08-03 DIAGNOSIS — M545 Low back pain, unspecified: Principal | ICD-10-CM

## 2020-08-03 DIAGNOSIS — R29898 Other symptoms and signs involving the musculoskeletal system: Principal | ICD-10-CM

## 2020-08-03 DIAGNOSIS — G8929 Other chronic pain: Principal | ICD-10-CM

## 2020-08-03 DIAGNOSIS — M47816 Spondylosis without myelopathy or radiculopathy, lumbar region: Principal | ICD-10-CM

## 2020-08-03 DIAGNOSIS — M5136 Other intervertebral disc degeneration, lumbar region: Principal | ICD-10-CM

## 2020-08-03 DIAGNOSIS — M4802 Spinal stenosis, cervical region: Principal | ICD-10-CM

## 2020-08-03 DIAGNOSIS — M79605 Pain in left leg: Principal | ICD-10-CM

## 2020-08-06 MED ORDER — TRIAMCINOLONE ACETONIDE 0.5 % TOPICAL CREAM
0 refills | 0 days
Start: 2020-08-06 — End: ?

## 2020-08-08 MED ORDER — GABAPENTIN 300 MG CAPSULE
ORAL_CAPSULE | Freq: Three times a day (TID) | ORAL | 1 refills | 90 days | Status: CP
Start: 2020-08-08 — End: ?

## 2020-08-09 MED ORDER — BUPROPION HCL SR 150 MG TABLET,12 HR SUSTAINED-RELEASE
ORAL_TABLET | Freq: Two times a day (BID) | ORAL | 2 refills | 30 days | Status: CP
Start: 2020-08-09 — End: 2021-08-09

## 2020-08-11 DIAGNOSIS — G35 Multiple sclerosis: Principal | ICD-10-CM

## 2020-08-11 MED ORDER — GABAPENTIN 300 MG CAPSULE
ORAL_CAPSULE | 1 refills | 0 days
Start: 2020-08-11 — End: ?

## 2020-08-18 ENCOUNTER — Ambulatory Visit: Admit: 2020-08-18 | Discharge: 2020-08-19 | Payer: MEDICARE

## 2020-08-18 DIAGNOSIS — G35 Multiple sclerosis: Principal | ICD-10-CM

## 2020-09-01 DIAGNOSIS — H6980 Other specified disorders of Eustachian tube, unspecified ear: Principal | ICD-10-CM

## 2020-09-01 MED ORDER — TRIAMCINOLONE ACETONIDE 0.5 % TOPICAL CREAM
0 refills | 0 days | Status: CP
Start: 2020-09-01 — End: ?

## 2020-09-02 ENCOUNTER — Institutional Professional Consult (permissible substitution): Admit: 2020-09-02 | Discharge: 2020-09-02 | Payer: MEDICARE | Attending: Audiologist | Primary: Audiologist

## 2020-09-02 ENCOUNTER — Ambulatory Visit: Admit: 2020-09-02 | Discharge: 2020-09-02 | Payer: MEDICARE | Attending: Family | Primary: Family

## 2020-09-02 DIAGNOSIS — H903 Sensorineural hearing loss, bilateral: Principal | ICD-10-CM

## 2020-09-02 DIAGNOSIS — H9202 Otalgia, left ear: Principal | ICD-10-CM

## 2020-09-02 DIAGNOSIS — Z885 Allergy status to narcotic agent status: Principal | ICD-10-CM

## 2020-09-02 DIAGNOSIS — M26622 Arthralgia of left temporomandibular joint: Principal | ICD-10-CM

## 2020-09-02 DIAGNOSIS — Z87891 Personal history of nicotine dependence: Principal | ICD-10-CM

## 2020-09-02 DIAGNOSIS — M869 Osteomyelitis, unspecified: Principal | ICD-10-CM

## 2020-09-02 DIAGNOSIS — I10 Essential (primary) hypertension: Principal | ICD-10-CM

## 2020-09-02 DIAGNOSIS — M810 Age-related osteoporosis without current pathological fracture: Principal | ICD-10-CM

## 2020-09-02 DIAGNOSIS — G35 Multiple sclerosis: Principal | ICD-10-CM

## 2020-09-02 DIAGNOSIS — Z79899 Other long term (current) drug therapy: Principal | ICD-10-CM

## 2020-09-02 DIAGNOSIS — Z0110 Encounter for examination of ears and hearing without abnormal findings: Principal | ICD-10-CM

## 2020-09-02 DIAGNOSIS — E785 Hyperlipidemia, unspecified: Principal | ICD-10-CM

## 2020-09-02 DIAGNOSIS — F32A Depression, unspecified: Principal | ICD-10-CM

## 2020-09-02 DIAGNOSIS — H6980 Other specified disorders of Eustachian tube, unspecified ear: Principal | ICD-10-CM

## 2020-09-24 DIAGNOSIS — M792 Neuralgia and neuritis, unspecified: Principal | ICD-10-CM

## 2020-09-24 DIAGNOSIS — G35 Multiple sclerosis: Principal | ICD-10-CM

## 2020-09-24 MED ORDER — LAMOTRIGINE 25 MG TABLET
ORAL_TABLET | ORAL | 2 refills | 0.00000 days | Status: CP
Start: 2020-09-24 — End: ?

## 2020-10-08 ENCOUNTER — Ambulatory Visit
Admit: 2020-10-08 | Discharge: 2020-10-09 | Payer: MEDICARE | Attending: Student in an Organized Health Care Education/Training Program | Primary: Student in an Organized Health Care Education/Training Program

## 2020-10-08 DIAGNOSIS — G63 Polyneuropathy in diseases classified elsewhere: Principal | ICD-10-CM

## 2020-10-08 DIAGNOSIS — G35 Multiple sclerosis: Principal | ICD-10-CM

## 2020-10-08 DIAGNOSIS — I10 Essential (primary) hypertension: Principal | ICD-10-CM

## 2020-10-08 DIAGNOSIS — Z7689 Persons encountering health services in other specified circumstances: Principal | ICD-10-CM

## 2020-10-08 DIAGNOSIS — E785 Hyperlipidemia, unspecified: Principal | ICD-10-CM

## 2020-10-08 DIAGNOSIS — F330 Major depressive disorder, recurrent, mild: Principal | ICD-10-CM

## 2020-10-10 MED ORDER — METHOCARBAMOL 500 MG TABLET
ORAL_TABLET | 0 refills | 0 days
Start: 2020-10-10 — End: ?

## 2020-10-11 MED ORDER — AMLODIPINE 5 MG TABLET
ORAL_TABLET | Freq: Every day | ORAL | 3 refills | 90.00000 days | Status: CP
Start: 2020-10-11 — End: 2020-11-10

## 2020-10-12 DIAGNOSIS — G35 Multiple sclerosis: Principal | ICD-10-CM

## 2020-10-12 DIAGNOSIS — M792 Neuralgia and neuritis, unspecified: Principal | ICD-10-CM

## 2020-10-12 DIAGNOSIS — I10 Essential (primary) hypertension: Principal | ICD-10-CM

## 2020-10-12 MED ORDER — LAMOTRIGINE 25 MG TABLET
ORAL_TABLET | 2 refills | 0 days
Start: 2020-10-12 — End: ?

## 2020-10-12 MED ORDER — TELMISARTAN 80 MG TABLET
ORAL_TABLET | Freq: Every day | ORAL | 3 refills | 90 days | Status: CP
Start: 2020-10-12 — End: 2021-04-10

## 2020-10-13 MED ORDER — LAMOTRIGINE 25 MG TABLET
ORAL_TABLET | ORAL | 5 refills | 0.00000 days | Status: CP
Start: 2020-10-13 — End: ?

## 2020-10-19 ENCOUNTER — Telehealth: Admit: 2020-10-19 | Discharge: 2020-10-20 | Payer: MEDICARE

## 2020-10-19 MED ORDER — CLONAZEPAM 0.5 MG TABLET
ORAL_TABLET | Freq: Three times a day (TID) | ORAL | 2 refills | 30.00000 days | Status: CP | PRN
Start: 2020-10-19 — End: ?

## 2020-10-19 MED ORDER — GABAPENTIN 300 MG CAPSULE
ORAL_CAPSULE | Freq: Three times a day (TID) | ORAL | 1 refills | 90 days | Status: CP
Start: 2020-10-19 — End: ?

## 2020-10-19 MED ORDER — BUPROPION HCL SR 150 MG TABLET,12 HR SUSTAINED-RELEASE
ORAL_TABLET | Freq: Two times a day (BID) | ORAL | 2 refills | 30 days | Status: CP
Start: 2020-10-19 — End: 2021-10-19

## 2020-10-20 DIAGNOSIS — G35 Multiple sclerosis: Principal | ICD-10-CM

## 2020-10-20 MED ORDER — BACLOFEN 20 MG TABLET
ORAL_TABLET | 1 refills | 0 days
Start: 2020-10-20 — End: ?

## 2020-10-21 MED ORDER — BUPROPION HCL SR 150 MG TABLET,12 HR SUSTAINED-RELEASE
ORAL_TABLET | 0 refills | 0 days | Status: CP
Start: 2020-10-21 — End: ?

## 2020-10-21 MED ORDER — BACLOFEN 20 MG TABLET
ORAL_TABLET | 1 refills | 0 days | Status: CP
Start: 2020-10-21 — End: ?

## 2020-10-26 ENCOUNTER — Ambulatory Visit: Admit: 2020-10-26 | Discharge: 2020-10-27 | Payer: MEDICARE

## 2020-10-26 DIAGNOSIS — M542 Cervicalgia: Principal | ICD-10-CM

## 2020-10-27 ENCOUNTER — Ambulatory Visit
Admit: 2020-10-27 | Discharge: 2020-10-28 | Payer: MEDICARE | Attending: Physical Medicine & Rehabilitation | Primary: Physical Medicine & Rehabilitation

## 2020-10-27 DIAGNOSIS — M7918 Myalgia, other site: Principal | ICD-10-CM

## 2020-10-27 DIAGNOSIS — M542 Cervicalgia: Principal | ICD-10-CM

## 2020-10-29 ENCOUNTER — Ambulatory Visit: Admit: 2020-10-29 | Discharge: 2020-10-29 | Payer: MEDICARE

## 2020-10-29 ENCOUNTER — Ambulatory Visit
Admit: 2020-10-29 | Discharge: 2020-10-29 | Payer: MEDICARE | Attending: Student in an Organized Health Care Education/Training Program | Primary: Student in an Organized Health Care Education/Training Program

## 2020-11-03 ENCOUNTER — Ambulatory Visit: Admit: 2020-11-03 | Discharge: 2020-11-04 | Payer: MEDICARE | Attending: Anesthesiology | Primary: Anesthesiology

## 2020-11-03 DIAGNOSIS — M47812 Spondylosis without myelopathy or radiculopathy, cervical region: Principal | ICD-10-CM

## 2020-11-03 DIAGNOSIS — M542 Cervicalgia: Principal | ICD-10-CM

## 2020-11-05 MED ORDER — OMEPRAZOLE 20 MG CAPSULE,DELAYED RELEASE
ORAL_CAPSULE | 0 refills | 0 days | Status: CP
Start: 2020-11-05 — End: ?

## 2020-11-05 MED ORDER — TRIAMCINOLONE ACETONIDE 0.5 % TOPICAL CREAM
0 refills | 0 days | Status: CP
Start: 2020-11-05 — End: ?

## 2020-11-11 DIAGNOSIS — G35 Multiple sclerosis: Principal | ICD-10-CM

## 2020-11-11 DIAGNOSIS — M792 Neuralgia and neuritis, unspecified: Principal | ICD-10-CM

## 2020-11-11 MED ORDER — LAMOTRIGINE 25 MG TABLET
ORAL_TABLET | 5 refills | 0 days | Status: CP
Start: 2020-11-11 — End: ?

## 2020-11-11 MED ORDER — TRIAMCINOLONE ACETONIDE 0.5 % TOPICAL CREAM
0 refills | 0 days
Start: 2020-11-11 — End: ?

## 2020-11-11 MED ORDER — OMEPRAZOLE 20 MG CAPSULE,DELAYED RELEASE
ORAL_CAPSULE | 1 refills | 0 days | Status: CP
Start: 2020-11-11 — End: ?

## 2020-11-11 MED ORDER — ROSUVASTATIN 5 MG TABLET
ORAL_TABLET | 1 refills | 0 days | Status: CP
Start: 2020-11-11 — End: ?

## 2020-11-18 ENCOUNTER — Ambulatory Visit: Admit: 2020-11-18 | Discharge: 2020-11-19 | Payer: MEDICARE

## 2020-11-18 DIAGNOSIS — R5383 Other fatigue: Principal | ICD-10-CM

## 2020-11-18 DIAGNOSIS — R5381 Other malaise: Principal | ICD-10-CM

## 2020-11-18 DIAGNOSIS — B349 Viral infection, unspecified: Principal | ICD-10-CM

## 2020-11-18 DIAGNOSIS — J029 Acute pharyngitis, unspecified: Principal | ICD-10-CM

## 2020-12-08 ENCOUNTER — Ambulatory Visit: Admit: 2020-12-08 | Discharge: 2020-12-09 | Payer: MEDICARE

## 2020-12-08 ENCOUNTER — Ambulatory Visit: Admit: 2020-12-08 | Discharge: 2020-12-09 | Payer: MEDICARE | Attending: Anesthesiology | Primary: Anesthesiology

## 2020-12-08 DIAGNOSIS — M47812 Spondylosis without myelopathy or radiculopathy, cervical region: Principal | ICD-10-CM

## 2020-12-21 ENCOUNTER — Ambulatory Visit: Admit: 2020-12-21 | Discharge: 2020-12-22 | Payer: MEDICARE

## 2020-12-21 DIAGNOSIS — H532 Diplopia: Principal | ICD-10-CM

## 2020-12-21 DIAGNOSIS — G35 Multiple sclerosis: Principal | ICD-10-CM

## 2020-12-28 ENCOUNTER — Ambulatory Visit: Admit: 2020-12-28 | Discharge: 2020-12-29 | Payer: MEDICARE

## 2020-12-28 DIAGNOSIS — Z85828 Personal history of other malignant neoplasm of skin: Principal | ICD-10-CM

## 2020-12-28 DIAGNOSIS — D492 Neoplasm of unspecified behavior of bone, soft tissue, and skin: Principal | ICD-10-CM

## 2020-12-28 DIAGNOSIS — L570 Actinic keratosis: Principal | ICD-10-CM

## 2020-12-28 DIAGNOSIS — G629 Polyneuropathy, unspecified: Principal | ICD-10-CM

## 2021-01-12 DIAGNOSIS — G35 Multiple sclerosis: Principal | ICD-10-CM

## 2021-01-12 DIAGNOSIS — M792 Neuralgia and neuritis, unspecified: Principal | ICD-10-CM

## 2021-01-12 MED ORDER — LAMOTRIGINE 25 MG TABLET
ORAL_TABLET | 5 refills | 0 days | Status: CP
Start: 2021-01-12 — End: ?

## 2021-01-13 MED ORDER — CLONAZEPAM 0.5 MG TABLET
ORAL_TABLET | Freq: Three times a day (TID) | ORAL | 2 refills | 30 days | Status: CP | PRN
Start: 2021-01-13 — End: ?

## 2021-01-14 ENCOUNTER — Telehealth
Admit: 2021-01-14 | Discharge: 2021-01-15 | Payer: MEDICARE | Attending: Physician Assistant | Primary: Physician Assistant

## 2021-01-14 MED ORDER — CLONAZEPAM 0.5 MG TABLET
ORAL_TABLET | Freq: Three times a day (TID) | ORAL | 2 refills | 30 days | Status: CP | PRN
Start: 2021-01-14 — End: ?

## 2021-01-14 MED ORDER — LAMOTRIGINE 25 MG TABLET
ORAL_TABLET | ORAL | 5 refills | 0.00000 days | Status: CP
Start: 2021-01-14 — End: ?

## 2021-01-18 ENCOUNTER — Telehealth: Admit: 2021-01-18 | Discharge: 2021-01-19 | Payer: MEDICARE

## 2021-01-18 DIAGNOSIS — F411 Generalized anxiety disorder: Principal | ICD-10-CM

## 2021-01-18 DIAGNOSIS — F32A Depression, unspecified depression type: Principal | ICD-10-CM

## 2021-01-18 MED ORDER — CLONAZEPAM 0.5 MG TABLET
ORAL_TABLET | Freq: Three times a day (TID) | ORAL | 5 refills | 30 days | Status: CP | PRN
Start: 2021-01-18 — End: ?

## 2021-01-18 MED ORDER — MIRTAZAPINE 7.5 MG TABLET
ORAL_TABLET | Freq: Every evening | ORAL | 3 refills | 90.00000 days | Status: CP
Start: 2021-01-18 — End: 2021-02-17

## 2021-01-19 MED ORDER — TRIAMCINOLONE ACETONIDE 0.5 % TOPICAL CREAM
0 refills | 0 days | Status: CP
Start: 2021-01-19 — End: ?

## 2021-01-27 ENCOUNTER — Ambulatory Visit: Admit: 2021-01-27 | Discharge: 2021-01-28 | Payer: MEDICARE | Attending: Anesthesiology | Primary: Anesthesiology

## 2021-01-27 ENCOUNTER — Ambulatory Visit: Admit: 2021-01-27 | Discharge: 2021-01-28 | Payer: MEDICARE

## 2021-01-27 DIAGNOSIS — M47812 Spondylosis without myelopathy or radiculopathy, cervical region: Principal | ICD-10-CM

## 2021-01-28 ENCOUNTER — Ambulatory Visit
Admit: 2021-01-28 | Discharge: 2021-01-29 | Payer: MEDICARE | Attending: Physician Assistant | Primary: Physician Assistant

## 2021-01-28 DIAGNOSIS — G35 Multiple sclerosis: Principal | ICD-10-CM

## 2021-01-28 DIAGNOSIS — Z9889 Other specified postprocedural states: Principal | ICD-10-CM

## 2021-02-01 ENCOUNTER — Ambulatory Visit: Admit: 2021-02-01 | Discharge: 2021-02-02 | Payer: MEDICARE

## 2021-02-06 DIAGNOSIS — G35 Multiple sclerosis: Principal | ICD-10-CM

## 2021-02-06 DIAGNOSIS — M792 Neuralgia and neuritis, unspecified: Principal | ICD-10-CM

## 2021-02-11 ENCOUNTER — Ambulatory Visit: Admit: 2021-02-11 | Discharge: 2021-02-12 | Payer: MEDICARE | Attending: Anesthesiology | Primary: Anesthesiology

## 2021-02-11 ENCOUNTER — Ambulatory Visit: Admit: 2021-02-11 | Discharge: 2021-02-12 | Payer: MEDICARE

## 2021-02-11 DIAGNOSIS — M47812 Spondylosis without myelopathy or radiculopathy, cervical region: Principal | ICD-10-CM

## 2021-02-16 MED ORDER — TIZANIDINE 4 MG TABLET
0.00000 days
Start: 2021-02-16 — End: ?

## 2021-03-02 ENCOUNTER — Ambulatory Visit: Admit: 2021-03-02 | Discharge: 2021-03-03 | Payer: MEDICARE

## 2021-03-02 DIAGNOSIS — F32A Depression, unspecified depression type: Principal | ICD-10-CM

## 2021-03-09 MED ORDER — BUPROPION HCL SR 150 MG TABLET,12 HR SUSTAINED-RELEASE
ORAL_TABLET | 0 refills | 0 days
Start: 2021-03-09 — End: ?

## 2021-03-10 MED ORDER — BUPROPION HCL SR 150 MG TABLET,12 HR SUSTAINED-RELEASE
ORAL_TABLET | 0 refills | 0 days | Status: CP
Start: 2021-03-10 — End: ?

## 2021-03-14 ENCOUNTER — Ambulatory Visit: Admit: 2021-03-14 | Discharge: 2021-03-15 | Payer: MEDICARE

## 2021-03-14 DIAGNOSIS — Z9889 Other specified postprocedural states: Principal | ICD-10-CM

## 2021-03-14 DIAGNOSIS — G35 Multiple sclerosis: Principal | ICD-10-CM

## 2021-03-16 DIAGNOSIS — G35 Multiple sclerosis: Principal | ICD-10-CM

## 2021-03-16 MED ORDER — BACLOFEN 20 MG TABLET
ORAL_TABLET | 1 refills | 0 days | Status: CP
Start: 2021-03-16 — End: ?

## 2021-03-22 ENCOUNTER — Ambulatory Visit: Admit: 2021-03-22 | Discharge: 2021-03-23 | Payer: MEDICARE

## 2021-03-22 ENCOUNTER — Ambulatory Visit: Admit: 2021-03-22 | Discharge: 2021-03-23 | Payer: MEDICARE | Attending: Anesthesiology | Primary: Anesthesiology

## 2021-03-22 DIAGNOSIS — M5416 Radiculopathy, lumbar region: Principal | ICD-10-CM

## 2021-04-08 ENCOUNTER — Ambulatory Visit
Admit: 2021-04-08 | Discharge: 2021-04-09 | Payer: MEDICARE | Attending: Student in an Organized Health Care Education/Training Program | Primary: Student in an Organized Health Care Education/Training Program

## 2021-04-08 DIAGNOSIS — E875 Hyperkalemia: Principal | ICD-10-CM

## 2021-04-08 DIAGNOSIS — F330 Major depressive disorder, recurrent, mild: Principal | ICD-10-CM

## 2021-04-08 DIAGNOSIS — I10 Essential (primary) hypertension: Principal | ICD-10-CM

## 2021-04-08 DIAGNOSIS — K5900 Constipation, unspecified: Principal | ICD-10-CM

## 2021-04-09 MED ORDER — METHOCARBAMOL 500 MG TABLET
ORAL_TABLET | 0 refills | 0 days
Start: 2021-04-09 — End: ?

## 2021-04-11 MED ORDER — METHOCARBAMOL 500 MG TABLET
ORAL_TABLET | 0 refills | 0 days | Status: CP
Start: 2021-04-11 — End: ?

## 2021-04-14 ENCOUNTER — Ambulatory Visit: Admit: 2021-04-14 | Discharge: 2021-04-15 | Payer: MEDICARE

## 2021-04-14 DIAGNOSIS — M47816 Spondylosis without myelopathy or radiculopathy, lumbar region: Principal | ICD-10-CM

## 2021-04-21 DIAGNOSIS — M792 Neuralgia and neuritis, unspecified: Principal | ICD-10-CM

## 2021-04-21 DIAGNOSIS — G35 Multiple sclerosis: Principal | ICD-10-CM

## 2021-04-21 MED ORDER — LAMOTRIGINE 25 MG TABLET
ORAL_TABLET | ORAL | 5 refills | 0.00000 days | Status: CP
Start: 2021-04-21 — End: ?

## 2021-04-26 MED ORDER — OMEPRAZOLE 20 MG CAPSULE,DELAYED RELEASE
ORAL_CAPSULE | Freq: Every day | ORAL | 0 refills | 30 days | Status: CP
Start: 2021-04-26 — End: 2021-05-26

## 2021-05-04 MED ORDER — TRIAMCINOLONE ACETONIDE 0.5 % TOPICAL CREAM
0 refills | 0 days | Status: CP
Start: 2021-05-04 — End: ?

## 2021-05-07 MED ORDER — METHOCARBAMOL 500 MG TABLET
ORAL_TABLET | 0 refills | 0 days
Start: 2021-05-07 — End: ?

## 2021-05-09 MED ORDER — METHOCARBAMOL 500 MG TABLET
ORAL_TABLET | 0 refills | 0 days | Status: CP
Start: 2021-05-09 — End: ?

## 2021-06-07 ENCOUNTER — Telehealth: Admit: 2021-06-07 | Discharge: 2021-06-08 | Payer: MEDICARE

## 2021-06-07 DIAGNOSIS — F411 Generalized anxiety disorder: Principal | ICD-10-CM

## 2021-06-07 DIAGNOSIS — G35 Multiple sclerosis: Principal | ICD-10-CM

## 2021-06-07 DIAGNOSIS — F32A Depression, unspecified depression type: Principal | ICD-10-CM

## 2021-06-07 MED ORDER — BUPROPION HCL SR 150 MG TABLET,12 HR SUSTAINED-RELEASE
ORAL_TABLET | Freq: Two times a day (BID) | ORAL | 0 refills | 90 days | Status: CP
Start: 2021-06-07 — End: ?

## 2021-06-07 MED ORDER — CLONAZEPAM 0.5 MG TABLET
ORAL_TABLET | Freq: Four times a day (QID) | ORAL | 5 refills | 23.00000 days | Status: CP
Start: 2021-06-07 — End: ?

## 2021-06-19 MED ORDER — METHOCARBAMOL 500 MG TABLET
ORAL_TABLET | 0 refills | 0 days
Start: 2021-06-19 — End: ?

## 2021-06-20 MED ORDER — METHOCARBAMOL 500 MG TABLET
ORAL_TABLET | 0 refills | 0 days | Status: CP
Start: 2021-06-20 — End: ?

## 2021-06-27 MED ORDER — CLONAZEPAM 0.5 MG TABLET
ORAL_TABLET | Freq: Four times a day (QID) | ORAL | 3 refills | 30 days | Status: CP
Start: 2021-06-27 — End: ?

## 2021-07-08 ENCOUNTER — Telehealth
Admit: 2021-07-08 | Payer: MEDICARE | Attending: Physical Medicine & Rehabilitation | Primary: Physical Medicine & Rehabilitation

## 2021-07-08 DIAGNOSIS — G35 Multiple sclerosis: Principal | ICD-10-CM

## 2021-07-08 DIAGNOSIS — M792 Neuralgia and neuritis, unspecified: Principal | ICD-10-CM

## 2021-07-08 MED ORDER — OMEPRAZOLE 20 MG CAPSULE,DELAYED RELEASE
ORAL_CAPSULE | 1 refills | 0 days | Status: CP
Start: 2021-07-08 — End: ?

## 2021-07-08 MED ORDER — LAMOTRIGINE 25 MG TABLET
ORAL_TABLET | 1 refills | 0 days | Status: CP
Start: 2021-07-08 — End: ?

## 2021-07-11 ENCOUNTER — Ambulatory Visit: Admit: 2021-07-11 | Discharge: 2021-07-12 | Payer: MEDICARE

## 2021-07-11 DIAGNOSIS — H43391 Other vitreous opacities, right eye: Principal | ICD-10-CM

## 2021-07-11 DIAGNOSIS — H3589 Other specified retinal disorders: Principal | ICD-10-CM

## 2021-07-11 DIAGNOSIS — H43811 Vitreous degeneration, right eye: Principal | ICD-10-CM

## 2021-07-25 DIAGNOSIS — M5410 Radiculopathy, site unspecified: Principal | ICD-10-CM

## 2021-07-27 MED ORDER — METHOCARBAMOL 500 MG TABLET
ORAL_TABLET | 0 refills | 0 days
Start: 2021-07-27 — End: ?

## 2021-07-28 MED ORDER — BUPROPION HCL SR 150 MG TABLET,12 HR SUSTAINED-RELEASE
ORAL_TABLET | 0 refills | 0.00000 days
Start: 2021-07-28 — End: ?

## 2021-07-28 MED ORDER — METHOCARBAMOL 500 MG TABLET
ORAL_TABLET | 0 refills | 0 days | Status: CP
Start: 2021-07-28 — End: ?

## 2021-08-01 DIAGNOSIS — G35 Multiple sclerosis: Principal | ICD-10-CM

## 2021-08-01 MED ORDER — GABAPENTIN 300 MG CAPSULE
ORAL_CAPSULE | Freq: Three times a day (TID) | ORAL | 0 refills | 30 days | Status: CP
Start: 2021-08-01 — End: ?

## 2021-08-04 ENCOUNTER — Ambulatory Visit: Admit: 2021-08-04 | Discharge: 2021-08-05 | Payer: MEDICARE

## 2021-08-11 ENCOUNTER — Ambulatory Visit: Admit: 2021-08-11 | Discharge: 2021-08-12 | Payer: MEDICARE | Attending: Ophthalmology | Primary: Ophthalmology

## 2021-08-17 MED ORDER — TRIAMCINOLONE ACETONIDE 0.5 % TOPICAL CREAM
5 refills | 0 days | Status: CP
Start: 2021-08-17 — End: ?

## 2021-08-24 ENCOUNTER — Ambulatory Visit
Admit: 2021-08-24 | Discharge: 2021-08-25 | Payer: MEDICARE | Attending: Student in an Organized Health Care Education/Training Program | Primary: Student in an Organized Health Care Education/Training Program

## 2021-08-24 DIAGNOSIS — L309 Dermatitis, unspecified: Principal | ICD-10-CM

## 2021-08-24 DIAGNOSIS — G63 Polyneuropathy in diseases classified elsewhere: Principal | ICD-10-CM

## 2021-08-24 MED ORDER — FLUOCINONIDE 0.05 % TOPICAL OINTMENT
1 refills | 0 days | Status: CP
Start: 2021-08-24 — End: ?

## 2021-08-30 ENCOUNTER — Telehealth: Admit: 2021-08-30 | Discharge: 2021-08-31 | Payer: MEDICARE

## 2021-08-30 DIAGNOSIS — G35 Multiple sclerosis: Principal | ICD-10-CM

## 2021-08-30 DIAGNOSIS — M792 Neuralgia and neuritis, unspecified: Principal | ICD-10-CM

## 2021-08-30 MED ORDER — CLONAZEPAM 0.5 MG TABLET
ORAL_TABLET | Freq: Four times a day (QID) | ORAL | 3 refills | 30 days | Status: CP
Start: 2021-08-30 — End: ?

## 2021-08-30 MED ORDER — BUPROPION HCL SR 150 MG TABLET,12 HR SUSTAINED-RELEASE
ORAL_TABLET | Freq: Two times a day (BID) | ORAL | 0 refills | 90 days | Status: CP
Start: 2021-08-30 — End: ?

## 2021-08-30 MED ORDER — GABAPENTIN 300 MG CAPSULE
ORAL_CAPSULE | Freq: Three times a day (TID) | ORAL | 0 refills | 30 days | Status: CP
Start: 2021-08-30 — End: ?

## 2021-09-03 MED ORDER — METHOCARBAMOL 500 MG TABLET
ORAL_TABLET | 0 refills | 0 days
Start: 2021-09-03 — End: ?

## 2021-09-06 MED ORDER — METHOCARBAMOL 500 MG TABLET
ORAL_TABLET | 0 refills | 0 days | Status: CP
Start: 2021-09-06 — End: ?

## 2021-09-19 ENCOUNTER — Ambulatory Visit
Admit: 2021-09-19 | Discharge: 2021-09-19 | Payer: MEDICARE | Attending: Student in an Organized Health Care Education/Training Program | Primary: Student in an Organized Health Care Education/Training Program

## 2021-09-19 DIAGNOSIS — G35 Multiple sclerosis: Principal | ICD-10-CM

## 2021-09-19 DIAGNOSIS — H9190 Unspecified hearing loss, unspecified ear: Principal | ICD-10-CM

## 2021-09-19 DIAGNOSIS — Z0000 Encounter for general adult medical examination without abnormal findings: Principal | ICD-10-CM

## 2021-09-19 DIAGNOSIS — E875 Hyperkalemia: Principal | ICD-10-CM

## 2021-09-19 DIAGNOSIS — F3342 Major depressive disorder, recurrent, in full remission: Principal | ICD-10-CM

## 2021-09-19 DIAGNOSIS — I10 Essential (primary) hypertension: Principal | ICD-10-CM

## 2021-09-19 DIAGNOSIS — E785 Hyperlipidemia, unspecified: Principal | ICD-10-CM

## 2021-09-19 DIAGNOSIS — Z7189 Other specified counseling: Principal | ICD-10-CM

## 2021-09-21 ENCOUNTER — Ambulatory Visit
Admit: 2021-09-21 | Discharge: 2021-09-21 | Payer: MEDICARE | Attending: Physician Assistant | Primary: Physician Assistant

## 2021-09-21 DIAGNOSIS — G35 Multiple sclerosis: Principal | ICD-10-CM

## 2021-09-24 MED ORDER — PRAVASTATIN 40 MG TABLET
ORAL_TABLET | Freq: Every evening | ORAL | 3 refills | 45 days | Status: CP
Start: 2021-09-24 — End: 2022-09-24

## 2021-09-26 ENCOUNTER — Ambulatory Visit: Admit: 2021-09-26 | Discharge: 2021-09-26 | Payer: MEDICARE

## 2021-09-26 DIAGNOSIS — L570 Actinic keratosis: Principal | ICD-10-CM

## 2021-09-26 DIAGNOSIS — Z85828 Personal history of other malignant neoplasm of skin: Principal | ICD-10-CM

## 2021-09-26 DIAGNOSIS — D229 Melanocytic nevi, unspecified: Principal | ICD-10-CM

## 2021-09-26 DIAGNOSIS — L309 Dermatitis, unspecified: Principal | ICD-10-CM

## 2021-09-26 DIAGNOSIS — M79672 Pain in left foot: Principal | ICD-10-CM

## 2021-09-26 DIAGNOSIS — D1801 Hemangioma of skin and subcutaneous tissue: Principal | ICD-10-CM

## 2021-09-26 DIAGNOSIS — M79671 Pain in right foot: Principal | ICD-10-CM

## 2021-09-26 MED ORDER — CLOBETASOL 0.05 % TOPICAL OINTMENT
Freq: Two times a day (BID) | TOPICAL | 1 refills | 0.00000 days | Status: CP
Start: 2021-09-26 — End: 2022-09-26

## 2021-10-07 MED ORDER — METHOCARBAMOL 500 MG TABLET
ORAL_TABLET | 0 refills | 0 days
Start: 2021-10-07 — End: ?

## 2021-10-10 ENCOUNTER — Ambulatory Visit: Admit: 2021-10-10 | Discharge: 2021-10-11 | Payer: MEDICARE

## 2021-10-10 DIAGNOSIS — H43391 Other vitreous opacities, right eye: Principal | ICD-10-CM

## 2021-10-10 DIAGNOSIS — I10 Essential (primary) hypertension: Principal | ICD-10-CM

## 2021-10-10 DIAGNOSIS — H43811 Vitreous degeneration, right eye: Principal | ICD-10-CM

## 2021-10-10 MED ORDER — TELMISARTAN 80 MG TABLET
ORAL_TABLET | 0 refills | 0 days | Status: CP
Start: 2021-10-10 — End: ?

## 2021-10-11 MED ORDER — METHOCARBAMOL 500 MG TABLET
ORAL_TABLET | ORAL | 0 refills | 0.00000 days | Status: CP
Start: 2021-10-11 — End: ?

## 2021-10-21 ENCOUNTER — Ambulatory Visit
Admit: 2021-10-21 | Discharge: 2021-10-22 | Payer: MEDICARE | Attending: Nurse Practitioner | Primary: Nurse Practitioner

## 2021-10-21 DIAGNOSIS — I10 Essential (primary) hypertension: Principal | ICD-10-CM

## 2021-10-29 DIAGNOSIS — G35 Multiple sclerosis: Principal | ICD-10-CM

## 2021-10-29 MED ORDER — BACLOFEN 20 MG TABLET
ORAL_TABLET | 1 refills | 0 days
Start: 2021-10-29 — End: ?

## 2021-10-31 MED ORDER — BACLOFEN 20 MG TABLET
ORAL_TABLET | 1 refills | 0 days
Start: 2021-10-31 — End: ?

## 2021-11-01 DIAGNOSIS — G35 Multiple sclerosis: Principal | ICD-10-CM

## 2021-11-01 MED ORDER — BACLOFEN 20 MG TABLET
ORAL_TABLET | 0 refills | 0 days | Status: CP
Start: 2021-11-01 — End: ?

## 2021-11-03 MED ORDER — CLONAZEPAM 0.5 MG TABLET
ORAL_TABLET | 0 refills | 0 days | Status: CP
Start: 2021-11-03 — End: ?

## 2021-11-04 ENCOUNTER — Ambulatory Visit: Admit: 2021-11-04 | Discharge: 2021-11-05 | Payer: MEDICARE

## 2021-11-04 DIAGNOSIS — G35 Multiple sclerosis: Principal | ICD-10-CM

## 2021-11-09 MED ORDER — AMLODIPINE 5 MG TABLET
ORAL_TABLET | Freq: Two times a day (BID) | ORAL | 1 refills | 90 days | Status: CP
Start: 2021-11-09 — End: 2021-12-09

## 2021-11-11 ENCOUNTER — Ambulatory Visit
Admit: 2021-11-11 | Discharge: 2021-11-12 | Payer: MEDICARE | Attending: Physical Medicine & Rehabilitation | Primary: Physical Medicine & Rehabilitation

## 2021-11-11 DIAGNOSIS — G8929 Other chronic pain: Principal | ICD-10-CM

## 2021-11-11 DIAGNOSIS — G35 Multiple sclerosis: Principal | ICD-10-CM

## 2021-11-11 DIAGNOSIS — M5417 Radiculopathy, lumbosacral region: Principal | ICD-10-CM

## 2021-11-11 DIAGNOSIS — M533 Sacrococcygeal disorders, not elsewhere classified: Principal | ICD-10-CM

## 2021-11-11 MED ORDER — METHOCARBAMOL 500 MG TABLET
ORAL_TABLET | Freq: Three times a day (TID) | ORAL | 0 refills | 30 days | Status: CP | PRN
Start: 2021-11-11 — End: ?

## 2021-11-11 MED ORDER — BACLOFEN 20 MG TABLET
ORAL_TABLET | Freq: Three times a day (TID) | ORAL | 0 refills | 30 days | Status: CP
Start: 2021-11-11 — End: ?

## 2021-11-14 DIAGNOSIS — M5417 Radiculopathy, lumbosacral region: Principal | ICD-10-CM

## 2021-11-17 ENCOUNTER — Ambulatory Visit: Admit: 2021-11-17 | Discharge: 2021-11-18 | Payer: MEDICARE

## 2021-11-29 ENCOUNTER — Telehealth: Admit: 2021-11-29 | Discharge: 2021-11-30 | Payer: MEDICARE

## 2021-11-29 DIAGNOSIS — F411 Generalized anxiety disorder: Principal | ICD-10-CM

## 2021-11-29 DIAGNOSIS — F32A Depression, unspecified depression type: Principal | ICD-10-CM

## 2021-11-29 DIAGNOSIS — G35 Multiple sclerosis: Principal | ICD-10-CM

## 2021-11-29 MED ORDER — GABAPENTIN 300 MG CAPSULE
ORAL_CAPSULE | Freq: Three times a day (TID) | ORAL | 1 refills | 30 days | Status: CP
Start: 2021-11-29 — End: ?

## 2021-11-29 MED ORDER — BUPROPION HCL SR 150 MG TABLET,12 HR SUSTAINED-RELEASE
ORAL_TABLET | Freq: Two times a day (BID) | ORAL | 0 refills | 90 days | Status: CP
Start: 2021-11-29 — End: ?

## 2021-11-29 MED ORDER — CLONAZEPAM 0.5 MG TABLET
ORAL_TABLET | Freq: Four times a day (QID) | ORAL | 0 refills | 30.00000 days | Status: CP
Start: 2021-11-29 — End: 2021-11-29

## 2021-12-02 MED ORDER — GABAPENTIN 300 MG CAPSULE
ORAL_CAPSULE | Freq: Three times a day (TID) | ORAL | 0 refills | 14 days | Status: CP
Start: 2021-12-02 — End: 2022-12-02

## 2021-12-09 ENCOUNTER — Ambulatory Visit: Admit: 2021-12-09 | Discharge: 2021-12-09 | Payer: MEDICARE

## 2021-12-13 MED ORDER — METHOCARBAMOL 500 MG TABLET
ORAL_TABLET | 0 refills | 0 days
Start: 2021-12-13 — End: ?

## 2021-12-14 MED ORDER — METHOCARBAMOL 500 MG TABLET
ORAL_TABLET | Freq: Three times a day (TID) | ORAL | 1 refills | 30 days | Status: CP | PRN
Start: 2021-12-14 — End: ?

## 2021-12-16 ENCOUNTER — Ambulatory Visit
Admit: 2021-12-16 | Discharge: 2021-12-17 | Payer: MEDICARE | Attending: Physical Medicine & Rehabilitation | Primary: Physical Medicine & Rehabilitation

## 2021-12-16 DIAGNOSIS — M533 Sacrococcygeal disorders, not elsewhere classified: Principal | ICD-10-CM

## 2021-12-16 DIAGNOSIS — G8929 Other chronic pain: Principal | ICD-10-CM

## 2021-12-22 MED ORDER — GABAPENTIN 300 MG CAPSULE
ORAL_CAPSULE | Freq: Three times a day (TID) | ORAL | 5 refills | 30 days | Status: CP
Start: 2021-12-22 — End: 2022-12-22

## 2021-12-23 MED ORDER — METHOCARBAMOL 500 MG TABLET
ORAL_TABLET | Freq: Three times a day (TID) | ORAL | 0 refills | 90 days | PRN
Start: 2021-12-23 — End: 2022-03-23

## 2021-12-26 MED ORDER — METHOCARBAMOL 500 MG TABLET
ORAL_TABLET | Freq: Three times a day (TID) | ORAL | 0 refills | 90 days | Status: CP | PRN
Start: 2021-12-26 — End: 2022-03-26

## 2022-02-03 ENCOUNTER — Ambulatory Visit
Admit: 2022-02-03 | Discharge: 2022-02-04 | Payer: MEDICARE | Attending: Physician Assistant | Primary: Physician Assistant

## 2022-02-03 DIAGNOSIS — G35 Multiple sclerosis: Principal | ICD-10-CM

## 2022-02-03 MED ORDER — GABAPENTIN 300 MG CAPSULE
ORAL_CAPSULE | Freq: Three times a day (TID) | ORAL | 2 refills | 30 days | Status: CP
Start: 2022-02-03 — End: 2023-02-03

## 2022-02-15 DIAGNOSIS — K219 Gastro-esophageal reflux disease without esophagitis: Principal | ICD-10-CM

## 2022-02-15 DIAGNOSIS — E785 Hyperlipidemia, unspecified: Principal | ICD-10-CM

## 2022-02-15 MED ORDER — OMEPRAZOLE 20 MG CAPSULE,DELAYED RELEASE
ORAL_CAPSULE | 1 refills | 0 days | Status: CP
Start: 2022-02-15 — End: ?

## 2022-02-15 MED ORDER — PRAVASTATIN 40 MG TABLET
ORAL_TABLET | 0 refills | 0 days | Status: CP
Start: 2022-02-15 — End: ?

## 2022-02-19 ENCOUNTER — Telehealth: Admit: 2022-02-19 | Discharge: 2022-02-20 | Payer: MEDICARE

## 2022-02-19 DIAGNOSIS — J029 Acute pharyngitis, unspecified: Principal | ICD-10-CM

## 2022-02-25 MED ORDER — METHOCARBAMOL 500 MG TABLET
ORAL_TABLET | 0 refills | 0 days
Start: 2022-02-25 — End: ?

## 2022-02-27 MED ORDER — METHOCARBAMOL 500 MG TABLET
ORAL_TABLET | 0 refills | 0 days | Status: CP
Start: 2022-02-27 — End: ?

## 2022-02-28 ENCOUNTER — Telehealth: Admit: 2022-02-28 | Discharge: 2022-03-01 | Payer: MEDICARE

## 2022-02-28 DIAGNOSIS — F32A Depression, unspecified depression type: Principal | ICD-10-CM

## 2022-03-01 ENCOUNTER — Ambulatory Visit: Admit: 2022-03-01 | Discharge: 2022-03-02 | Payer: MEDICARE

## 2022-03-01 DIAGNOSIS — H43391 Other vitreous opacities, right eye: Principal | ICD-10-CM

## 2022-03-01 DIAGNOSIS — I10 Essential (primary) hypertension: Principal | ICD-10-CM

## 2022-03-01 DIAGNOSIS — H43811 Vitreous degeneration, right eye: Principal | ICD-10-CM

## 2022-03-01 DIAGNOSIS — H3589 Other specified retinal disorders: Principal | ICD-10-CM

## 2022-03-01 MED ORDER — TELMISARTAN 80 MG TABLET
ORAL_TABLET | 0 refills | 0 days
Start: 2022-03-01 — End: ?

## 2022-03-03 ENCOUNTER — Ambulatory Visit: Admit: 2022-03-03 | Discharge: 2022-03-04 | Payer: MEDICARE

## 2022-03-03 DIAGNOSIS — J029 Acute pharyngitis, unspecified: Principal | ICD-10-CM

## 2022-03-03 DIAGNOSIS — G35 Multiple sclerosis: Principal | ICD-10-CM

## 2022-03-03 MED ORDER — MAGIC MOUTHWASH (NYSTATIN/DIPHENHYDRAMINE/MYLANTA) ORAL MIXTURE
Freq: Four times a day (QID) | ORAL | 0 refills | 30.00000 days | Status: CP | PRN
Start: 2022-03-03 — End: 2022-04-02

## 2022-03-07 DIAGNOSIS — I10 Essential (primary) hypertension: Principal | ICD-10-CM

## 2022-03-07 MED ORDER — TELMISARTAN 80 MG TABLET
ORAL_TABLET | Freq: Every day | ORAL | 3 refills | 90.00000 days | Status: CP
Start: 2022-03-07 — End: ?

## 2022-03-08 ENCOUNTER — Ambulatory Visit
Admit: 2022-03-08 | Discharge: 2022-03-08 | Payer: MEDICARE | Attending: Physician Assistant | Primary: Physician Assistant

## 2022-03-08 ENCOUNTER — Ambulatory Visit: Admit: 2022-03-08 | Discharge: 2022-03-08 | Payer: MEDICARE

## 2022-03-08 DIAGNOSIS — H5030 Unspecified intermittent heterotropia: Principal | ICD-10-CM

## 2022-03-08 DIAGNOSIS — G35 Multiple sclerosis: Principal | ICD-10-CM

## 2022-03-08 DIAGNOSIS — H532 Diplopia: Principal | ICD-10-CM

## 2022-03-08 MED ORDER — GABAPENTIN 300 MG CAPSULE
ORAL_CAPSULE | Freq: Four times a day (QID) | ORAL | 2 refills | 30 days | Status: CP
Start: 2022-03-08 — End: 2023-03-08

## 2022-03-08 MED ORDER — BACLOFEN 20 MG TABLET
ORAL_TABLET | Freq: Three times a day (TID) | ORAL | 2 refills | 30 days | Status: CP | PRN
Start: 2022-03-08 — End: ?

## 2022-03-13 DIAGNOSIS — G35 Multiple sclerosis: Principal | ICD-10-CM

## 2022-03-23 DIAGNOSIS — G35 Multiple sclerosis: Principal | ICD-10-CM

## 2022-03-23 MED ORDER — BACLOFEN 20 MG TABLET
ORAL_TABLET | Freq: Three times a day (TID) | ORAL | 2 refills | 30 days | Status: CP | PRN
Start: 2022-03-23 — End: ?

## 2022-03-30 ENCOUNTER — Ambulatory Visit: Admit: 2022-03-30 | Payer: MEDICARE

## 2022-03-30 ENCOUNTER — Ambulatory Visit: Admit: 2022-03-30 | Discharge: 2022-03-31 | Payer: MEDICARE | Attending: Neurology | Primary: Neurology

## 2022-03-30 DIAGNOSIS — Z7409 Other reduced mobility: Principal | ICD-10-CM

## 2022-03-30 DIAGNOSIS — M792 Neuralgia and neuritis, unspecified: Principal | ICD-10-CM

## 2022-03-30 DIAGNOSIS — R41841 Cognitive communication deficit: Principal | ICD-10-CM

## 2022-03-30 DIAGNOSIS — G35 Multiple sclerosis: Principal | ICD-10-CM

## 2022-03-30 DIAGNOSIS — R1310 Dysphagia, unspecified: Principal | ICD-10-CM

## 2022-03-30 DIAGNOSIS — Z79899 Other long term (current) drug therapy: Principal | ICD-10-CM

## 2022-03-30 DIAGNOSIS — R1312 Dysphagia, oropharyngeal phase: Principal | ICD-10-CM

## 2022-03-30 DIAGNOSIS — Z789 Other specified health status: Principal | ICD-10-CM

## 2022-04-05 ENCOUNTER — Ambulatory Visit: Admit: 2022-04-05 | Discharge: 2022-04-06 | Payer: MEDICARE

## 2022-04-05 DIAGNOSIS — G35 Multiple sclerosis: Principal | ICD-10-CM

## 2022-04-06 ENCOUNTER — Telehealth
Admit: 2022-04-06 | Discharge: 2022-04-07 | Payer: MEDICARE | Attending: Physician Assistant | Primary: Physician Assistant

## 2022-04-06 DIAGNOSIS — G35 Multiple sclerosis: Principal | ICD-10-CM

## 2022-04-06 MED ORDER — AMLODIPINE 5 MG TABLET
ORAL_TABLET | ORAL | 0 refills | 0 days | Status: CP
Start: 2022-04-06 — End: ?

## 2022-04-10 ENCOUNTER — Ambulatory Visit
Admit: 2022-04-10 | Discharge: 2022-04-11 | Payer: MEDICARE | Attending: Hematology & Oncology | Primary: Hematology & Oncology

## 2022-04-10 DIAGNOSIS — I10 Essential (primary) hypertension: Principal | ICD-10-CM

## 2022-04-10 DIAGNOSIS — F3342 Major depressive disorder, recurrent, in full remission: Principal | ICD-10-CM

## 2022-04-10 DIAGNOSIS — E785 Hyperlipidemia, unspecified: Principal | ICD-10-CM

## 2022-04-10 DIAGNOSIS — G35 Multiple sclerosis: Principal | ICD-10-CM

## 2022-04-10 MED ORDER — PRAVASTATIN 40 MG TABLET
ORAL_TABLET | Freq: Every evening | ORAL | 0 refills | 90.00000 days | Status: CN
Start: 2022-04-10 — End: 2022-04-10

## 2022-04-12 ENCOUNTER — Ambulatory Visit
Admit: 2022-04-12 | Discharge: 2022-04-13 | Payer: MEDICARE | Attending: Physical Medicine & Rehabilitation | Primary: Physical Medicine & Rehabilitation

## 2022-04-12 DIAGNOSIS — M533 Sacrococcygeal disorders, not elsewhere classified: Principal | ICD-10-CM

## 2022-04-12 DIAGNOSIS — G8929 Other chronic pain: Principal | ICD-10-CM

## 2022-04-12 DIAGNOSIS — G35 Multiple sclerosis: Principal | ICD-10-CM

## 2022-04-12 DIAGNOSIS — G509 Disorder of trigeminal nerve, unspecified: Principal | ICD-10-CM

## 2022-04-18 ENCOUNTER — Ambulatory Visit
Admit: 2022-04-18 | Discharge: 2022-04-20 | Payer: MEDICARE | Attending: Radiation Oncology | Primary: Radiation Oncology

## 2022-04-18 ENCOUNTER — Ambulatory Visit
Admit: 2022-04-18 | Discharge: 2022-04-19 | Payer: MEDICARE | Attending: Speech-Language Pathologist | Primary: Speech-Language Pathologist

## 2022-04-18 ENCOUNTER — Ambulatory Visit: Admit: 2022-04-18 | Discharge: 2022-04-19 | Payer: MEDICARE

## 2022-04-21 ENCOUNTER — Ambulatory Visit: Admit: 2022-05-05 | Discharge: 2022-05-06 | Payer: MEDICARE

## 2022-04-21 ENCOUNTER — Ambulatory Visit
Admit: 2022-04-21 | Discharge: 2022-05-16 | Payer: MEDICARE | Attending: Radiation Oncology | Primary: Radiation Oncology

## 2022-04-21 ENCOUNTER — Ambulatory Visit
Admit: 2022-04-21 | Discharge: 2022-05-20 | Payer: MEDICARE | Attending: Radiation Oncology | Primary: Radiation Oncology

## 2022-04-21 ENCOUNTER — Ambulatory Visit
Admit: 2022-04-21 | Discharge: 2022-05-05 | Payer: MEDICARE | Attending: Radiation Oncology | Primary: Radiation Oncology

## 2022-04-28 ENCOUNTER — Ambulatory Visit: Admit: 2022-04-28 | Discharge: 2022-04-29 | Payer: MEDICARE

## 2022-04-28 ENCOUNTER — Institutional Professional Consult (permissible substitution): Admit: 2022-04-28 | Discharge: 2022-04-29 | Payer: MEDICARE | Attending: Audiologist | Primary: Audiologist

## 2022-04-28 DIAGNOSIS — H9190 Unspecified hearing loss, unspecified ear: Principal | ICD-10-CM

## 2022-05-02 DIAGNOSIS — R490 Dysphonia: Principal | ICD-10-CM

## 2022-05-04 ENCOUNTER — Ambulatory Visit: Admit: 2022-05-04 | Discharge: 2022-05-05 | Payer: MEDICARE

## 2022-05-04 ENCOUNTER — Ambulatory Visit: Admit: 2022-04-21 | Payer: MEDICARE

## 2022-05-05 DIAGNOSIS — K219 Gastro-esophageal reflux disease without esophagitis: Principal | ICD-10-CM

## 2022-05-05 DIAGNOSIS — Z01818 Encounter for other preprocedural examination: Principal | ICD-10-CM

## 2022-05-05 DIAGNOSIS — F32A Depression, unspecified depression type: Principal | ICD-10-CM

## 2022-05-05 DIAGNOSIS — E785 Hyperlipidemia, unspecified: Principal | ICD-10-CM

## 2022-05-05 DIAGNOSIS — I10 Essential (primary) hypertension: Principal | ICD-10-CM

## 2022-05-05 DIAGNOSIS — G63 Polyneuropathy in diseases classified elsewhere: Principal | ICD-10-CM

## 2022-05-05 DIAGNOSIS — G35 Multiple sclerosis: Principal | ICD-10-CM

## 2022-05-05 DIAGNOSIS — R490 Dysphonia: Principal | ICD-10-CM

## 2022-05-15 DIAGNOSIS — M5410 Radiculopathy, site unspecified: Principal | ICD-10-CM

## 2022-05-17 MED ORDER — METHOCARBAMOL 500 MG TABLET
ORAL_TABLET | 0 refills | 0 days | Status: CP
Start: 2022-05-17 — End: ?

## 2022-05-19 ENCOUNTER — Encounter: Admit: 2022-05-19 | Discharge: 2022-05-19 | Payer: MEDICARE | Attending: Anesthesiology | Primary: Anesthesiology

## 2022-05-19 ENCOUNTER — Ambulatory Visit: Admit: 2022-05-19 | Discharge: 2022-05-19 | Payer: MEDICARE

## 2022-05-20 MED ORDER — GABAPENTIN 300 MG CAPSULE
ORAL_CAPSULE | Freq: Four times a day (QID) | ORAL | 0 refills | 34 days
Start: 2022-05-20 — End: ?

## 2022-05-21 ENCOUNTER — Ambulatory Visit: Admit: 2022-05-21 | Payer: MEDICARE

## 2022-05-22 ENCOUNTER — Ambulatory Visit: Admit: 2022-05-22 | Payer: MEDICARE

## 2022-05-22 MED ORDER — GABAPENTIN 300 MG CAPSULE
ORAL_CAPSULE | Freq: Four times a day (QID) | ORAL | 4 refills | 34.00000 days | Status: CP
Start: 2022-05-22 — End: ?

## 2022-05-23 MED ORDER — GABAPENTIN 300 MG CAPSULE
ORAL_CAPSULE | Freq: Four times a day (QID) | ORAL | 4 refills | 34 days
Start: 2022-05-23 — End: ?

## 2022-05-23 MED ORDER — METHYLPREDNISOLONE 4 MG TABLETS IN A DOSE PACK
0 refills | 0 days | Status: CP
Start: 2022-05-23 — End: ?

## 2022-05-25 MED ORDER — CLONAZEPAM 0.5 MG TABLET
ORAL_TABLET | Freq: Four times a day (QID) | ORAL | 0 refills | 30 days | Status: CP
Start: 2022-05-25 — End: ?

## 2022-06-01 ENCOUNTER — Ambulatory Visit: Admit: 2022-06-01 | Payer: MEDICARE | Attending: Anesthesiology | Primary: Anesthesiology

## 2022-06-06 ENCOUNTER — Telehealth: Admit: 2022-06-06 | Discharge: 2022-06-07 | Payer: MEDICARE

## 2022-06-06 MED ORDER — CLONAZEPAM 0.5 MG TABLET
ORAL_TABLET | Freq: Four times a day (QID) | ORAL | 5 refills | 30 days | Status: CP
Start: 2022-06-06 — End: ?

## 2022-06-06 MED ORDER — BUPROPION HCL SR 150 MG TABLET,12 HR SUSTAINED-RELEASE
ORAL_TABLET | Freq: Two times a day (BID) | ORAL | 0 refills | 90 days | Status: CP
Start: 2022-06-06 — End: ?

## 2022-06-08 ENCOUNTER — Ambulatory Visit: Admit: 2022-06-08 | Payer: MEDICARE

## 2022-06-08 ENCOUNTER — Telehealth: Admit: 2022-06-08 | Payer: MEDICARE

## 2022-06-08 ENCOUNTER — Ambulatory Visit
Admit: 2022-06-08 | Payer: MEDICARE | Attending: Student in an Organized Health Care Education/Training Program | Primary: Student in an Organized Health Care Education/Training Program

## 2022-06-09 ENCOUNTER — Ambulatory Visit: Admit: 2022-06-09 | Discharge: 2022-06-10 | Payer: MEDICARE

## 2022-06-09 MED ORDER — OMEPRAZOLE 20 MG CAPSULE,DELAYED RELEASE
ORAL_CAPSULE | Freq: Two times a day (BID) | ORAL | 2 refills | 30 days | Status: CP
Start: 2022-06-09 — End: 2022-09-07

## 2022-06-14 DIAGNOSIS — M5410 Radiculopathy, site unspecified: Principal | ICD-10-CM

## 2022-06-14 MED ORDER — CLONAZEPAM 0.5 MG TABLET
ORAL_TABLET | Freq: Four times a day (QID) | ORAL | 5 refills | 30 days | Status: CP
Start: 2022-06-14 — End: ?

## 2022-06-15 ENCOUNTER — Ambulatory Visit: Admit: 2022-06-15 | Discharge: 2022-06-16 | Payer: MEDICARE

## 2022-06-20 DIAGNOSIS — R070 Pain in throat: Principal | ICD-10-CM

## 2022-06-20 DIAGNOSIS — R490 Dysphonia: Principal | ICD-10-CM

## 2022-06-20 DIAGNOSIS — J387 Other diseases of larynx: Principal | ICD-10-CM

## 2022-06-22 MED ORDER — GABAPENTIN 300 MG CAPSULE
ORAL_CAPSULE | Freq: Four times a day (QID) | ORAL | 4 refills | 34 days | Status: CP
Start: 2022-06-22 — End: ?

## 2022-06-26 ENCOUNTER — Ambulatory Visit
Admit: 2022-06-26 | Discharge: 2022-06-27 | Payer: MEDICARE | Attending: Radiation Oncology | Primary: Radiation Oncology

## 2022-06-29 ENCOUNTER — Ambulatory Visit
Admit: 2022-06-29 | Payer: MEDICARE | Attending: Student in an Organized Health Care Education/Training Program | Primary: Student in an Organized Health Care Education/Training Program

## 2022-06-29 ENCOUNTER — Telehealth: Admit: 2022-06-29 | Payer: MEDICARE

## 2022-06-29 ENCOUNTER — Ambulatory Visit: Admit: 2022-06-29 | Discharge: 2022-07-27 | Payer: MEDICARE

## 2022-06-29 ENCOUNTER — Ambulatory Visit: Admit: 2022-06-29 | Payer: MEDICARE

## 2022-07-04 DIAGNOSIS — R490 Dysphonia: Principal | ICD-10-CM

## 2022-07-06 DIAGNOSIS — E785 Hyperlipidemia, unspecified: Principal | ICD-10-CM

## 2022-07-06 MED ORDER — PRAVASTATIN 40 MG TABLET
ORAL_TABLET | Freq: Every evening | ORAL | 2 refills | 90 days | Status: CP
Start: 2022-07-06 — End: ?

## 2022-07-08 DIAGNOSIS — G500 Trigeminal neuralgia: Principal | ICD-10-CM

## 2022-07-13 DIAGNOSIS — G35 Multiple sclerosis: Principal | ICD-10-CM

## 2022-07-13 MED ORDER — BACLOFEN 20 MG TABLET
ORAL_TABLET | 3 refills | 0 days
Start: 2022-07-13 — End: ?

## 2022-07-19 MED ORDER — BACLOFEN 20 MG TABLET
ORAL_TABLET | 5 refills | 0 days | Status: CP
Start: 2022-07-19 — End: ?

## 2022-07-20 MED ORDER — PAXLOVID 300 MG (150 MG X 2)-100 MG TABLETS IN A DOSE PACK
ORAL_TABLET | 0 refills | 0 days | Status: CP
Start: 2022-07-20 — End: ?

## 2022-07-21 ENCOUNTER — Institutional Professional Consult (permissible substitution): Admit: 2022-07-20 | Discharge: 2022-07-21 | Payer: MEDICARE

## 2022-07-27 ENCOUNTER — Telehealth: Admit: 2022-07-27 | Discharge: 2022-07-28 | Payer: MEDICARE | Attending: Anesthesiology | Primary: Anesthesiology

## 2022-07-27 DIAGNOSIS — G9520 Unspecified cord compression: Principal | ICD-10-CM

## 2022-07-27 DIAGNOSIS — M47812 Spondylosis without myelopathy or radiculopathy, cervical region: Principal | ICD-10-CM

## 2022-07-27 DIAGNOSIS — G35 Multiple sclerosis: Principal | ICD-10-CM

## 2022-07-27 DIAGNOSIS — M25551 Pain in right hip: Principal | ICD-10-CM

## 2022-07-27 DIAGNOSIS — M5416 Radiculopathy, lumbar region: Principal | ICD-10-CM

## 2022-08-01 ENCOUNTER — Ambulatory Visit
Admit: 2022-08-01 | Payer: MEDICARE | Attending: Student in an Organized Health Care Education/Training Program | Primary: Student in an Organized Health Care Education/Training Program

## 2022-08-01 ENCOUNTER — Telehealth: Admit: 2022-08-01 | Discharge: 2022-08-26 | Payer: MEDICARE

## 2022-08-01 ENCOUNTER — Telehealth: Admit: 2022-08-01 | Payer: MEDICARE

## 2022-08-01 DIAGNOSIS — R490 Dysphonia: Principal | ICD-10-CM

## 2022-08-04 ENCOUNTER — Ambulatory Visit: Admit: 2022-08-04 | Discharge: 2022-08-04 | Disposition: A | Discharge status: Home / Self Care | Payer: MEDICARE

## 2022-08-04 DIAGNOSIS — G500 Trigeminal neuralgia: Principal | ICD-10-CM

## 2022-08-04 MED ORDER — OXYCODONE 5 MG TABLET
ORAL_TABLET | ORAL | 0 refills | 2 days | Status: CP | PRN
Start: 2022-08-04 — End: 2022-08-09

## 2022-08-04 MED ORDER — HYDROXYZINE HCL 25 MG TABLET
ORAL_TABLET | Freq: Three times a day (TID) | ORAL | 0 refills | 7 days | Status: CP | PRN
Start: 2022-08-04 — End: ?

## 2022-08-17 DIAGNOSIS — G500 Trigeminal neuralgia: Principal | ICD-10-CM

## 2022-08-17 MED ORDER — METHYLPREDNISOLONE 4 MG TABLETS IN A DOSE PACK
0 refills | 0 days | Status: CP
Start: 2022-08-17 — End: ?

## 2022-08-19 MED ORDER — BUPROPION HCL SR 150 MG TABLET,12 HR SUSTAINED-RELEASE
ORAL_TABLET | ORAL | 3 refills | 0 days
Start: 2022-08-19 — End: ?

## 2022-08-22 DIAGNOSIS — R490 Dysphonia: Principal | ICD-10-CM

## 2022-08-22 MED ORDER — BUPROPION HCL SR 150 MG TABLET,12 HR SUSTAINED-RELEASE
ORAL_TABLET | ORAL | 3 refills | 0 days | Status: CP
Start: 2022-08-22 — End: ?

## 2022-08-27 MED ORDER — AMLODIPINE 5 MG TABLET
ORAL_TABLET | ORAL | 3 refills | 0 days
Start: 2022-08-27 — End: ?

## 2022-08-28 ENCOUNTER — Ambulatory Visit: Admit: 2022-08-28 | Discharge: 2022-08-29 | Payer: MEDICARE

## 2022-08-28 DIAGNOSIS — H5030 Unspecified intermittent heterotropia: Principal | ICD-10-CM

## 2022-08-28 DIAGNOSIS — H43811 Vitreous degeneration, right eye: Principal | ICD-10-CM

## 2022-08-28 DIAGNOSIS — H532 Diplopia: Principal | ICD-10-CM

## 2022-08-28 MED ORDER — AMLODIPINE 5 MG TABLET
ORAL_TABLET | ORAL | 1 refills | 0 days | Status: CP
Start: 2022-08-28 — End: ?

## 2022-09-08 MED ORDER — PRAVASTATIN 40 MG TABLET
ORAL_TABLET | Freq: Every evening | ORAL | 2 refills | 90 days
Start: 2022-09-08 — End: ?

## 2022-09-12 ENCOUNTER — Ambulatory Visit
Admit: 2022-09-12 | Discharge: 2022-09-13 | Payer: MEDICARE | Attending: Student in an Organized Health Care Education/Training Program | Primary: Student in an Organized Health Care Education/Training Program

## 2022-09-12 DIAGNOSIS — Z0000 Encounter for general adult medical examination without abnormal findings: Principal | ICD-10-CM

## 2022-09-12 DIAGNOSIS — E785 Hyperlipidemia, unspecified: Principal | ICD-10-CM

## 2022-09-12 DIAGNOSIS — G35 Multiple sclerosis: Principal | ICD-10-CM

## 2022-09-12 DIAGNOSIS — F3342 Major depressive disorder, recurrent, in full remission: Principal | ICD-10-CM

## 2022-09-12 DIAGNOSIS — I10 Essential (primary) hypertension: Principal | ICD-10-CM

## 2022-09-12 MED ORDER — PRAVASTATIN 40 MG TABLET
ORAL_TABLET | Freq: Every evening | ORAL | 2 refills | 90 days | Status: CP
Start: 2022-09-12 — End: ?

## 2022-09-14 ENCOUNTER — Ambulatory Visit: Admit: 2022-09-14 | Discharge: 2022-09-15 | Payer: MEDICARE | Attending: Urology | Primary: Urology

## 2022-09-15 ENCOUNTER — Ambulatory Visit: Admit: 2022-09-15 | Discharge: 2022-09-16 | Payer: MEDICARE

## 2022-09-15 DIAGNOSIS — G9520 Unspecified cord compression: Principal | ICD-10-CM

## 2022-09-15 DIAGNOSIS — M25551 Pain in right hip: Principal | ICD-10-CM

## 2022-09-15 DIAGNOSIS — G35 Multiple sclerosis: Principal | ICD-10-CM

## 2022-09-15 DIAGNOSIS — M47812 Spondylosis without myelopathy or radiculopathy, cervical region: Principal | ICD-10-CM

## 2022-09-15 DIAGNOSIS — M5416 Radiculopathy, lumbar region: Principal | ICD-10-CM

## 2022-09-19 ENCOUNTER — Ambulatory Visit
Admit: 2022-09-19 | Discharge: 2022-09-20 | Payer: MEDICARE | Attending: Student in an Organized Health Care Education/Training Program | Primary: Student in an Organized Health Care Education/Training Program

## 2022-09-19 DIAGNOSIS — R070 Pain in throat: Principal | ICD-10-CM

## 2022-09-19 DIAGNOSIS — J387 Other diseases of larynx: Principal | ICD-10-CM

## 2022-09-19 DIAGNOSIS — R490 Dysphonia: Principal | ICD-10-CM

## 2022-09-25 ENCOUNTER — Ambulatory Visit: Admit: 2022-09-25 | Discharge: 2022-09-25 | Payer: MEDICARE

## 2022-09-26 ENCOUNTER — Telehealth: Admit: 2022-09-26 | Discharge: 2022-09-27 | Payer: MEDICARE

## 2022-09-26 DIAGNOSIS — F32A Depression, unspecified depression type: Principal | ICD-10-CM

## 2022-09-26 DIAGNOSIS — F411 Generalized anxiety disorder: Principal | ICD-10-CM

## 2022-09-27 ENCOUNTER — Institutional Professional Consult (permissible substitution)
Admit: 2022-09-27 | Discharge: 2022-09-28 | Payer: MEDICARE | Attending: Audiologist-Hearing Aid Fitter | Primary: Audiologist-Hearing Aid Fitter

## 2022-09-28 MED ORDER — PREDNISONE 10 MG TABLETS IN A DOSE PACK
ORAL_TABLET | Freq: Every day | ORAL | 0 refills | 12 days | Status: CP
Start: 2022-09-28 — End: 2022-10-10

## 2022-10-04 ENCOUNTER — Ambulatory Visit: Admit: 2022-10-04 | Discharge: 2022-10-05 | Payer: MEDICARE | Attending: Neurology | Primary: Neurology

## 2022-10-04 MED ORDER — NALTREXONE 1.5 MG CAPSULE
ORAL_CAPSULE | ORAL | 1 refills | 0.00000 days | Status: CP
Start: 2022-10-04 — End: 2022-10-04

## 2022-10-05 MED ORDER — GABAPENTIN 300 MG CAPSULE
ORAL_CAPSULE | Freq: Four times a day (QID) | ORAL | 4 refills | 34 days
Start: 2022-10-05 — End: ?

## 2022-10-06 MED ORDER — GABAPENTIN 300 MG CAPSULE
ORAL_CAPSULE | 1 refills | 0 days | Status: CP
Start: 2022-10-06 — End: ?

## 2022-10-26 ENCOUNTER — Ambulatory Visit: Admit: 2022-10-26 | Discharge: 2022-10-27 | Payer: MEDICARE

## 2022-10-26 DIAGNOSIS — G500 Trigeminal neuralgia: Principal | ICD-10-CM

## 2022-10-26 MED ORDER — NALTREXONE 4.5 MG CAPSULE
ORAL_CAPSULE | Freq: Every evening | ORAL | 5 refills | 0 days | Status: CP
Start: 2022-10-26 — End: ?

## 2022-10-27 DIAGNOSIS — Z862 Personal history of diseases of the blood and blood-forming organs and certain disorders involving the immune mechanism: Principal | ICD-10-CM

## 2022-10-27 DIAGNOSIS — M5416 Radiculopathy, lumbar region: Principal | ICD-10-CM

## 2022-10-31 ENCOUNTER — Telehealth: Admit: 2022-10-31 | Payer: MEDICARE

## 2022-10-31 ENCOUNTER — Ambulatory Visit: Admit: 2022-10-31 | Discharge: 2022-11-24 | Payer: MEDICARE

## 2022-10-31 DIAGNOSIS — R1312 Dysphagia, oropharyngeal phase: Principal | ICD-10-CM

## 2022-10-31 DIAGNOSIS — R41841 Cognitive communication deficit: Principal | ICD-10-CM

## 2022-10-31 DIAGNOSIS — G35 Multiple sclerosis: Principal | ICD-10-CM

## 2022-10-31 DIAGNOSIS — R490 Dysphonia: Principal | ICD-10-CM

## 2022-10-31 DIAGNOSIS — Z7409 Other reduced mobility: Principal | ICD-10-CM

## 2022-11-01 ENCOUNTER — Ambulatory Visit: Admit: 2022-11-01 | Discharge: 2022-11-02 | Payer: MEDICARE

## 2022-11-01 DIAGNOSIS — M4802 Spinal stenosis, cervical region: Principal | ICD-10-CM

## 2022-11-09 DIAGNOSIS — M5416 Radiculopathy, lumbar region: Principal | ICD-10-CM

## 2022-11-09 DIAGNOSIS — M5126 Other intervertebral disc displacement, lumbar region: Principal | ICD-10-CM

## 2022-11-09 DIAGNOSIS — M5116 Intervertebral disc disorders with radiculopathy, lumbar region: Principal | ICD-10-CM

## 2022-11-09 DIAGNOSIS — M4316 Spondylolisthesis, lumbar region: Principal | ICD-10-CM

## 2022-11-09 DIAGNOSIS — Q762 Congenital spondylolisthesis: Principal | ICD-10-CM

## 2022-11-09 DIAGNOSIS — M48061 Spinal stenosis, lumbar region without neurogenic claudication: Principal | ICD-10-CM

## 2022-12-02 DIAGNOSIS — G35 Multiple sclerosis: Principal | ICD-10-CM

## 2022-12-02 MED ORDER — BACLOFEN 20 MG TABLET
ORAL_TABLET | 11 refills | 0 days
Start: 2022-12-02 — End: ?

## 2022-12-04 MED ORDER — BACLOFEN 20 MG TABLET
ORAL_TABLET | 11 refills | 0.00000 days
Start: 2022-12-04 — End: ?

## 2022-12-07 MED ORDER — BACLOFEN 20 MG TABLET
ORAL_TABLET | Freq: Three times a day (TID) | ORAL | 5 refills | 30 days | Status: CP
Start: 2022-12-07 — End: ?

## 2022-12-12 ENCOUNTER — Ambulatory Visit
Admission: RE | Admit: 2022-12-12 | Discharge: 2022-12-12 | Disposition: A | Discharge status: Home / Self Care | Payer: Medicare Other | Attending: Physician Assistant | Admitting: Physician Assistant

## 2022-12-12 ENCOUNTER — Ambulatory Visit
Admission: RE | Admit: 2022-12-12 | Discharge: 2022-12-12 | Disposition: A | Discharge status: Home / Self Care | Payer: Medicare Other | Source: Ambulatory Visit | Attending: Physician Assistant | Admitting: Physician Assistant

## 2022-12-12 ENCOUNTER — Other Ambulatory Visit: Payer: Self-pay

## 2022-12-12 DIAGNOSIS — J209 Acute bronchitis, unspecified: Secondary | ICD-10-CM | POA: Insufficient documentation

## 2022-12-15 ENCOUNTER — Telehealth
Admit: 2022-12-15 | Discharge: 2022-12-16 | Payer: MEDICARE | Attending: Student in an Organized Health Care Education/Training Program | Primary: Student in an Organized Health Care Education/Training Program

## 2022-12-15 DIAGNOSIS — J387 Other diseases of larynx: Principal | ICD-10-CM

## 2022-12-15 DIAGNOSIS — R490 Dysphonia: Principal | ICD-10-CM

## 2022-12-17 MED ORDER — CLONAZEPAM 0.5 MG TABLET
ORAL_TABLET | Freq: Four times a day (QID) | ORAL | 0 refills | 0 days
Start: 2022-12-17 — End: ?

## 2022-12-18 MED ORDER — CLONAZEPAM 0.5 MG TABLET
ORAL_TABLET | Freq: Four times a day (QID) | ORAL | 0 refills | 30 days | Status: CP
Start: 2022-12-18 — End: ?

## 2022-12-22 DIAGNOSIS — I10 Essential (primary) hypertension: Principal | ICD-10-CM

## 2022-12-22 MED ORDER — TELMISARTAN 80 MG TABLET
ORAL_TABLET | Freq: Every day | ORAL | 3 refills | 0 days
Start: 2022-12-22 — End: ?

## 2022-12-26 DIAGNOSIS — I10 Essential (primary) hypertension: Principal | ICD-10-CM

## 2022-12-26 MED ORDER — TELMISARTAN 80 MG TABLET
ORAL_TABLET | Freq: Every day | ORAL | 3 refills | 90 days
Start: 2022-12-26 — End: ?

## 2022-12-27 MED ORDER — TELMISARTAN 80 MG TABLET
ORAL_TABLET | Freq: Every day | ORAL | 3 refills | 90 days | Status: CP
Start: 2022-12-27 — End: ?

## 2022-12-28 ENCOUNTER — Ambulatory Visit: Admit: 2022-12-28 | Discharge: 2022-12-28 | Payer: MEDICARE

## 2022-12-28 DIAGNOSIS — M4316 Spondylolisthesis, lumbar region: Principal | ICD-10-CM

## 2022-12-28 DIAGNOSIS — M5416 Radiculopathy, lumbar region: Principal | ICD-10-CM

## 2022-12-28 DIAGNOSIS — Z862 Personal history of diseases of the blood and blood-forming organs and certain disorders involving the immune mechanism: Principal | ICD-10-CM

## 2022-12-28 DIAGNOSIS — M5116 Intervertebral disc disorders with radiculopathy, lumbar region: Principal | ICD-10-CM

## 2022-12-28 DIAGNOSIS — M5126 Other intervertebral disc displacement, lumbar region: Principal | ICD-10-CM

## 2022-12-28 DIAGNOSIS — Q762 Congenital spondylolisthesis: Principal | ICD-10-CM

## 2022-12-28 DIAGNOSIS — M48061 Spinal stenosis, lumbar region without neurogenic claudication: Principal | ICD-10-CM

## 2023-01-01 MED ORDER — GABAPENTIN 300 MG CAPSULE
ORAL_CAPSULE | 3 refills | 0 days | Status: CP
Start: 2023-01-01 — End: ?

## 2023-01-02 MED ORDER — GABAPENTIN 300 MG CAPSULE
ORAL_CAPSULE | 3 refills | 0 days | Status: CP
Start: 2023-01-02 — End: ?

## 2023-01-08 MED ORDER — ACETAMINOPHEN 500 MG TABLET
ORAL_TABLET | Freq: Three times a day (TID) | ORAL | 0 refills | 14.00000 days
Start: 2023-01-08 — End: 2023-01-22

## 2023-01-08 MED ORDER — SENNOSIDES 8.6 MG TABLET
ORAL_TABLET | Freq: Two times a day (BID) | ORAL | 0 refills | 5.00000 days | PRN
Start: 2023-01-08 — End: 2023-01-18

## 2023-01-08 MED ORDER — CHOLECALCIFEROL (VITAMIN D3) 50 MCG (2,000 UNIT) TABLET
ORAL_TABLET | Freq: Every day | ORAL | 0 refills | 90.00000 days
Start: 2023-01-08 — End: 2023-04-08

## 2023-01-08 MED ORDER — OXYCODONE 5 MG TABLET
ORAL_TABLET | ORAL | 0 refills | 7.00000 days | PRN
Start: 2023-01-08 — End: ?

## 2023-01-08 MED ORDER — PREGABALIN 25 MG CAPSULE
ORAL_CAPSULE | Freq: Three times a day (TID) | ORAL | 0 refills | 14.00000 days
Start: 2023-01-08 — End: 2023-01-22

## 2023-01-08 MED ORDER — ONDANSETRON HCL 4 MG TABLET
ORAL_TABLET | Freq: Three times a day (TID) | ORAL | 0 refills | 7.00000 days | PRN
Start: 2023-01-08 — End: 2023-02-07

## 2023-01-08 MED ORDER — CALCIUM ANTACID 300 MG (AS CALCIUM CARBONATE 750 MG) CHEWABLE TABLET
ORAL_TABLET | Freq: Two times a day (BID) | ORAL | 0 refills | 30.00000 days
Start: 2023-01-08 — End: 2023-02-07

## 2023-01-08 MED ORDER — ENOXAPARIN 40 MG/0.4 ML SUBCUTANEOUS SYRINGE
SUBCUTANEOUS | 0 refills | 30.00000 days
Start: 2023-01-08 — End: 2023-02-07

## 2023-01-08 MED ORDER — POLYETHYLENE GLYCOL 3350 17 GRAM ORAL POWDER PACKET
PACK | Freq: Every day | ORAL | 0 refills | 14.00000 days
Start: 2023-01-08 — End: 2023-01-22

## 2023-01-10 ENCOUNTER — Ambulatory Visit: Admit: 2023-01-10 | Discharge: 2023-01-16 | Disposition: A | Discharge status: Home / Self Care | Payer: MEDICARE

## 2023-01-10 ENCOUNTER — Encounter: Admit: 2023-01-10 | Discharge: 2023-01-16 | Disposition: A | Discharge status: Home / Self Care | Payer: MEDICARE

## 2023-01-10 ENCOUNTER — Encounter: Admit: 2023-01-10 | Discharge: 2023-01-16 | Payer: MEDICARE

## 2023-01-10 ENCOUNTER — Ambulatory Visit: Admit: 2023-01-10 | Discharge: 2023-01-16 | Payer: MEDICARE

## 2023-01-10 ENCOUNTER — Encounter: Admit: 2023-01-10 | Payer: MEDICARE

## 2023-01-12 MED ORDER — SENNOSIDES 8.6 MG TABLET
ORAL_TABLET | Freq: Two times a day (BID) | ORAL | 0 refills | 5.00000 days | PRN
Start: 2023-01-12 — End: 2023-01-22

## 2023-01-12 MED ORDER — POLYETHYLENE GLYCOL 3350 17 GRAM ORAL POWDER PACKET
PACK | Freq: Every day | ORAL | 0 refills | 14.00000 days
Start: 2023-01-12 — End: 2023-01-26

## 2023-01-12 MED ORDER — CALCIUM ANTACID 300 MG (AS CALCIUM CARBONATE 750 MG) CHEWABLE TABLET
ORAL_TABLET | Freq: Two times a day (BID) | ORAL | 0 refills | 30.00000 days
Start: 2023-01-12 — End: 2023-02-11

## 2023-01-12 MED ORDER — CHOLECALCIFEROL (VITAMIN D3) 50 MCG (2,000 UNIT) TABLET
ORAL_TABLET | Freq: Every day | ORAL | 0 refills | 90.00000 days
Start: 2023-01-12 — End: 2023-04-12

## 2023-01-12 MED ORDER — ENOXAPARIN 40 MG/0.4 ML SUBCUTANEOUS SYRINGE
SUBCUTANEOUS | 0 refills | 30.00000 days
Start: 2023-01-12 — End: 2023-02-11

## 2023-01-12 MED ORDER — ACETAMINOPHEN 500 MG TABLET
ORAL_TABLET | Freq: Three times a day (TID) | ORAL | 0 refills | 14.00000 days
Start: 2023-01-12 — End: 2023-01-26

## 2023-01-12 MED ORDER — OXYCODONE 5 MG TABLET
ORAL_TABLET | ORAL | 0 refills | 7.00000 days | PRN
Start: 2023-01-12 — End: ?

## 2023-01-16 DIAGNOSIS — M5416 Radiculopathy, lumbar region: Principal | ICD-10-CM

## 2023-01-16 DIAGNOSIS — M47816 Spondylosis without myelopathy or radiculopathy, lumbar region: Principal | ICD-10-CM

## 2023-01-16 MED ORDER — CHOLECALCIFEROL (VITAMIN D3) 50 MCG (2,000 UNIT) TABLET
ORAL_TABLET | Freq: Every day | ORAL | 0 refills | 100 days | Status: CP
Start: 2023-01-16 — End: 2023-04-26
  Filled 2023-01-16: qty 100, 100d supply, fill #0

## 2023-01-16 MED ORDER — ACETAMINOPHEN 500 MG TABLET
ORAL_TABLET | Freq: Three times a day (TID) | ORAL | 0 refills | 14 days | Status: CP
Start: 2023-01-16 — End: 2023-01-30
  Filled 2023-01-16: qty 84, 14d supply, fill #0

## 2023-01-16 MED ORDER — POLYETHYLENE GLYCOL 3350 17 GRAM/DOSE ORAL POWDER
Freq: Every day | ORAL | 0 refills | 14 days | Status: CP
Start: 2023-01-16 — End: 2023-01-30

## 2023-01-16 MED ORDER — OXYCODONE 5 MG TABLET
ORAL_TABLET | ORAL | 0 refills | 7 days | Status: CP | PRN
Start: 2023-01-16 — End: ?
  Filled 2023-01-16: qty 40, 7d supply, fill #0

## 2023-01-16 MED ORDER — CALCIUM ANTACID 300 MG (AS CALCIUM CARBONATE 750 MG) CHEWABLE TABLET
ORAL_TABLET | Freq: Two times a day (BID) | ORAL | 0 refills | 48 days | Status: CP
Start: 2023-01-16 — End: 2023-02-15

## 2023-01-16 MED ORDER — ENOXAPARIN 40 MG/0.4 ML SUBCUTANEOUS SYRINGE
SUBCUTANEOUS | 0 refills | 30 days | Status: CP
Start: 2023-01-16 — End: 2023-02-15
  Filled 2023-01-16: qty 12, 30d supply, fill #0

## 2023-01-16 MED ORDER — SENNOSIDES 8.6 MG TABLET
ORAL_TABLET | Freq: Two times a day (BID) | ORAL | 0 refills | 5 days | Status: CP | PRN
Start: 2023-01-16 — End: 2023-01-26

## 2023-01-17 MED ORDER — CLONAZEPAM 0.5 MG TABLET
ORAL_TABLET | Freq: Four times a day (QID) | ORAL | 0 refills | 30 days | Status: CP
Start: 2023-01-17 — End: ?

## 2023-01-21 MED ORDER — AMLODIPINE 5 MG TABLET
ORAL_TABLET | ORAL | 3 refills | 0 days
Start: 2023-01-21 — End: ?

## 2023-01-22 MED ORDER — AMLODIPINE 5 MG TABLET
ORAL_TABLET | ORAL | 1 refills | 0 days | Status: CP
Start: 2023-01-22 — End: ?

## 2023-01-25 ENCOUNTER — Ambulatory Visit: Admit: 2023-01-25 | Discharge: 2023-01-26 | Payer: MEDICARE

## 2023-01-25 DIAGNOSIS — M545 Lumbar back pain: Principal | ICD-10-CM

## 2023-01-25 DIAGNOSIS — M5416 Radiculopathy, lumbar region: Principal | ICD-10-CM

## 2023-01-25 MED ORDER — OXYCODONE 5 MG TABLET
ORAL_TABLET | ORAL | 0 refills | 7 days | Status: CP | PRN
Start: 2023-01-25 — End: ?

## 2023-02-04 DIAGNOSIS — M792 Neuralgia and neuritis, unspecified: Principal | ICD-10-CM

## 2023-02-04 MED ORDER — GABAPENTIN 300 MG CAPSULE
ORAL_CAPSULE | Freq: Three times a day (TID) | ORAL | 1 refills | 90 days | Status: CP
Start: 2023-02-04 — End: ?

## 2023-02-19 ENCOUNTER — Telehealth: Admit: 2023-02-19 | Discharge: 2023-02-20 | Payer: MEDICARE

## 2023-02-19 MED ORDER — BUPROPION HCL SR 200 MG TABLET,12 HR SUSTAINED-RELEASE
Freq: Two times a day (BID) | ORAL | 0 refills | 30 days | Status: CP
Start: 2023-02-19 — End: 2024-02-19

## 2023-02-19 MED ORDER — CLONAZEPAM 0.5 MG TABLET
ORAL_TABLET | Freq: Four times a day (QID) | ORAL | 5 refills | 30 days | Status: CP
Start: 2023-02-19 — End: ?

## 2023-02-20 ENCOUNTER — Ambulatory Visit: Admit: 2023-02-20 | Discharge: 2023-02-20 | Payer: MEDICARE

## 2023-02-20 DIAGNOSIS — M5416 Radiculopathy, lumbar region: Principal | ICD-10-CM

## 2023-02-20 MED ORDER — METHOCARBAMOL 500 MG TABLET
ORAL_TABLET | Freq: Three times a day (TID) | ORAL | 0 refills | 30 days | Status: CP | PRN
Start: 2023-02-20 — End: ?

## 2023-03-08 MED ORDER — METHOCARBAMOL 500 MG TABLET
ORAL_TABLET | Freq: Three times a day (TID) | ORAL | 0 refills | 30 days | Status: CP | PRN
Start: 2023-03-08 — End: ?

## 2023-03-09 DIAGNOSIS — M5416 Radiculopathy, lumbar region: Principal | ICD-10-CM

## 2023-03-09 DIAGNOSIS — M47816 Spondylosis without myelopathy or radiculopathy, lumbar region: Principal | ICD-10-CM

## 2023-03-19 MED ORDER — METHOCARBAMOL 500 MG TABLET
ORAL_TABLET | 11 refills | 0 days
Start: 2023-03-19 — End: ?

## 2023-03-21 MED ORDER — BUPROPION HCL SR 200 MG TABLET,12 HR SUSTAINED-RELEASE
Freq: Two times a day (BID) | ORAL | 0 refills | 30 days | Status: CP
Start: 2023-03-21 — End: 2024-03-20

## 2023-03-23 ENCOUNTER — Ambulatory Visit: Admit: 2023-03-23 | Discharge: 2023-03-24 | Payer: MEDICARE

## 2023-04-01 MED ORDER — BUPROPION HCL SR 200 MG TABLET,12 HR SUSTAINED-RELEASE
ORAL_TABLET | ORAL | 0 refills | 0 days
Start: 2023-04-01 — End: ?

## 2023-04-02 MED ORDER — BUPROPION HCL SR 200 MG TABLET,12 HR SUSTAINED-RELEASE
ORAL_TABLET | ORAL | 0 refills | 0 days | Status: CP
Start: 2023-04-02 — End: ?

## 2023-04-03 ENCOUNTER — Ambulatory Visit: Admit: 2023-04-03 | Discharge: 2023-04-04 | Payer: MEDICARE

## 2023-04-03 DIAGNOSIS — M5416 Radiculopathy, lumbar region: Principal | ICD-10-CM

## 2023-04-03 MED ORDER — BUPROPION HCL SR 150 MG TABLET,12 HR SUSTAINED-RELEASE
ORAL_TABLET | Freq: Two times a day (BID) | ORAL | 3 refills | 0 days
Start: 2023-04-03 — End: ?

## 2023-04-04 MED ORDER — BUPROPION HCL SR 150 MG TABLET,12 HR SUSTAINED-RELEASE
ORAL_TABLET | Freq: Two times a day (BID) | ORAL | 3 refills | 90 days | Status: CP
Start: 2023-04-04 — End: ?

## 2023-04-09 ENCOUNTER — Ambulatory Visit
Admit: 2023-04-09 | Discharge: 2023-04-10 | Payer: MEDICARE | Attending: Nurse Practitioner | Primary: Nurse Practitioner

## 2023-04-09 DIAGNOSIS — I951 Orthostatic hypotension: Principal | ICD-10-CM

## 2023-04-09 DIAGNOSIS — G35 Multiple sclerosis: Principal | ICD-10-CM

## 2023-04-09 MED ORDER — METHYLPREDNISOLONE 4 MG TABLETS IN A DOSE PACK
0 refills | 0.00000 days | Status: CP
Start: 2023-04-09 — End: 2023-04-09

## 2023-04-13 ENCOUNTER — Ambulatory Visit
Admit: 2023-04-13 | Discharge: 2023-04-14 | Payer: MEDICARE | Attending: Student in an Organized Health Care Education/Training Program | Primary: Student in an Organized Health Care Education/Training Program

## 2023-04-13 DIAGNOSIS — M792 Neuralgia and neuritis, unspecified: Principal | ICD-10-CM

## 2023-04-13 DIAGNOSIS — R143 Flatulence: Principal | ICD-10-CM

## 2023-04-13 DIAGNOSIS — E785 Hyperlipidemia, unspecified: Principal | ICD-10-CM

## 2023-04-13 DIAGNOSIS — M4802 Spinal stenosis, cervical region: Principal | ICD-10-CM

## 2023-04-13 DIAGNOSIS — I10 Essential (primary) hypertension: Principal | ICD-10-CM

## 2023-04-13 DIAGNOSIS — R140 Abdominal distension (gaseous): Principal | ICD-10-CM

## 2023-04-13 DIAGNOSIS — Z780 Asymptomatic menopausal state: Principal | ICD-10-CM

## 2023-04-13 DIAGNOSIS — G35 Multiple sclerosis: Principal | ICD-10-CM

## 2023-04-16 DIAGNOSIS — M5416 Radiculopathy, lumbar region: Principal | ICD-10-CM

## 2023-04-16 DIAGNOSIS — M898X1 Other specified disorders of bone, shoulder: Principal | ICD-10-CM

## 2023-04-18 ENCOUNTER — Ambulatory Visit: Admit: 2023-04-18 | Discharge: 2023-04-19 | Payer: MEDICARE

## 2023-04-18 ENCOUNTER — Ambulatory Visit
Admit: 2023-04-18 | Discharge: 2023-04-19 | Payer: MEDICARE | Attending: Student in an Organized Health Care Education/Training Program | Primary: Student in an Organized Health Care Education/Training Program

## 2023-04-18 DIAGNOSIS — R143 Flatulence: Principal | ICD-10-CM

## 2023-04-18 DIAGNOSIS — R140 Abdominal distension (gaseous): Principal | ICD-10-CM

## 2023-04-18 MED ORDER — LACOSAMIDE 50 MG TABLET
ORAL_TABLET | Freq: Two times a day (BID) | ORAL | 2 refills | 30 days | Status: CP
Start: 2023-04-18 — End: ?

## 2023-04-24 ENCOUNTER — Ambulatory Visit: Admit: 2023-04-24 | Discharge: 2023-04-25 | Payer: MEDICARE

## 2023-04-24 DIAGNOSIS — M5416 Radiculopathy, lumbar region: Principal | ICD-10-CM

## 2023-04-25 ENCOUNTER — Ambulatory Visit: Admit: 2023-04-25 | Discharge: 2023-04-26 | Payer: MEDICARE

## 2023-05-01 ENCOUNTER — Ambulatory Visit: Admit: 2023-05-01 | Discharge: 2023-05-02 | Payer: MEDICARE | Attending: Family | Primary: Family

## 2023-05-01 ENCOUNTER — Ambulatory Visit: Admit: 2023-05-01 | Discharge: 2023-05-02 | Payer: MEDICARE

## 2023-05-01 DIAGNOSIS — M898X1 Other specified disorders of bone, shoulder: Principal | ICD-10-CM

## 2023-05-01 DIAGNOSIS — M4802 Spinal stenosis, cervical region: Principal | ICD-10-CM

## 2023-05-07 MED ORDER — OMEPRAZOLE 20 MG CAPSULE,DELAYED RELEASE
ORAL_CAPSULE | Freq: Every day | ORAL | 3 refills | 90 days | Status: CP
Start: 2023-05-07 — End: 2024-05-06

## 2023-05-09 ENCOUNTER — Ambulatory Visit: Admit: 2023-05-09 | Payer: MEDICARE

## 2023-05-09 ENCOUNTER — Ambulatory Visit: Admit: 2023-05-09 | Discharge: 2023-06-07 | Payer: MEDICARE

## 2023-05-16 ENCOUNTER — Ambulatory Visit
Admit: 2023-05-16 | Discharge: 2023-05-17 | Payer: MEDICARE | Attending: Student in an Organized Health Care Education/Training Program | Primary: Student in an Organized Health Care Education/Training Program

## 2023-05-16 DIAGNOSIS — I10 Essential (primary) hypertension: Principal | ICD-10-CM

## 2023-05-16 DIAGNOSIS — M4802 Spinal stenosis, cervical region: Principal | ICD-10-CM

## 2023-05-16 DIAGNOSIS — G63 Polyneuropathy in diseases classified elsewhere: Principal | ICD-10-CM

## 2023-05-16 DIAGNOSIS — G35 Multiple sclerosis: Principal | ICD-10-CM

## 2023-05-22 ENCOUNTER — Ambulatory Visit: Admit: 2023-05-22 | Discharge: 2023-05-23 | Payer: MEDICARE

## 2023-05-22 DIAGNOSIS — R55 Syncope and collapse: Principal | ICD-10-CM

## 2023-05-22 DIAGNOSIS — R42 Dizziness and giddiness: Principal | ICD-10-CM

## 2023-05-22 MED ORDER — OMEPRAZOLE 20 MG CAPSULE,DELAYED RELEASE
ORAL_CAPSULE | Freq: Two times a day (BID) | ORAL | 3 refills | 90 days | Status: CP
Start: 2023-05-22 — End: 2024-05-21

## 2023-05-31 ENCOUNTER — Ambulatory Visit: Admit: 2023-05-31 | Discharge: 2023-06-01 | Payer: MEDICARE

## 2023-05-31 DIAGNOSIS — M25511 Pain in right shoulder: Principal | ICD-10-CM

## 2023-06-01 MED ORDER — ALENDRONATE 70 MG-CHOLECALCIFEROL (VITAMIN D3) 5,600 UNIT TABLET
ORAL_TABLET | ORAL | 3 refills | 84 days | Status: CP
Start: 2023-06-01 — End: 2024-05-31

## 2023-06-13 DIAGNOSIS — E785 Hyperlipidemia, unspecified: Principal | ICD-10-CM

## 2023-06-13 MED ORDER — PRAVASTATIN 40 MG TABLET
ORAL_TABLET | Freq: Every evening | ORAL | 3 refills | 90 days | Status: CP
Start: 2023-06-13 — End: ?

## 2023-06-13 MED ORDER — AMLODIPINE 5 MG TABLET
ORAL_TABLET | ORAL | 3 refills | 0 days | Status: CP
Start: 2023-06-13 — End: ?

## 2023-06-19 ENCOUNTER — Ambulatory Visit: Admit: 2023-06-19 | Discharge: 2023-06-20 | Payer: MEDICARE

## 2023-06-19 DIAGNOSIS — M5416 Radiculopathy, lumbar region: Principal | ICD-10-CM

## 2023-06-19 DIAGNOSIS — M533 Sacrococcygeal disorders, not elsewhere classified: Principal | ICD-10-CM

## 2023-06-20 DIAGNOSIS — M810 Age-related osteoporosis without current pathological fracture: Principal | ICD-10-CM

## 2023-06-20 MED ORDER — ALENDRONATE 70 MG TABLET
ORAL_TABLET | ORAL | 3 refills | 84 days | Status: CP
Start: 2023-06-20 — End: 2024-06-19

## 2023-06-21 ENCOUNTER — Ambulatory Visit: Admit: 2023-06-21 | Payer: MEDICARE

## 2023-06-21 ENCOUNTER — Ambulatory Visit: Admit: 2023-06-21 | Discharge: 2023-07-07 | Payer: MEDICARE

## 2023-06-21 MED ORDER — CLONAZEPAM 0.5 MG TABLET
ORAL_TABLET | Freq: Three times a day (TID) | ORAL | 0 refills | 30 days | Status: CP
Start: 2023-06-21 — End: ?

## 2023-06-25 ENCOUNTER — Ambulatory Visit: Admit: 2023-06-25 | Discharge: 2023-06-26 | Payer: MEDICARE

## 2023-06-25 DIAGNOSIS — M533 Sacrococcygeal disorders, not elsewhere classified: Principal | ICD-10-CM

## 2023-07-02 DIAGNOSIS — M792 Neuralgia and neuritis, unspecified: Principal | ICD-10-CM

## 2023-07-02 MED ORDER — GABAPENTIN 300 MG CAPSULE
ORAL_CAPSULE | Freq: Four times a day (QID) | ORAL | 1 refills | 68 days | Status: CP
Start: 2023-07-02 — End: ?

## 2023-07-05 ENCOUNTER — Ambulatory Visit: Admit: 2023-07-05 | Discharge: 2023-07-06 | Payer: MEDICARE

## 2023-07-05 DIAGNOSIS — M25511 Pain in right shoulder: Principal | ICD-10-CM

## 2023-07-05 DIAGNOSIS — M5416 Radiculopathy, lumbar region: Principal | ICD-10-CM

## 2023-07-05 DIAGNOSIS — M12811 Other specific arthropathies, not elsewhere classified, right shoulder: Principal | ICD-10-CM

## 2023-07-12 ENCOUNTER — Ambulatory Visit: Admit: 2023-07-12 | Payer: MEDICARE

## 2023-07-12 ENCOUNTER — Ambulatory Visit: Admit: 2023-07-12 | Discharge: 2023-08-06 | Payer: MEDICARE

## 2023-07-13 ENCOUNTER — Telehealth
Admit: 2023-07-13 | Discharge: 2023-07-14 | Payer: MEDICARE | Attending: Student in an Organized Health Care Education/Training Program | Primary: Student in an Organized Health Care Education/Training Program

## 2023-07-13 DIAGNOSIS — R490 Dysphonia: Principal | ICD-10-CM

## 2023-07-17 ENCOUNTER — Telehealth: Admit: 2023-07-17 | Discharge: 2023-07-18 | Payer: MEDICARE

## 2023-07-17 DIAGNOSIS — F3341 Major depressive disorder, recurrent, in partial remission: Principal | ICD-10-CM

## 2023-07-17 MED ORDER — CLONAZEPAM 0.5 MG TABLET
ORAL_TABLET | Freq: Three times a day (TID) | ORAL | 5 refills | 30 days | Status: CP
Start: 2023-07-17 — End: 2023-07-17

## 2023-07-17 MED ORDER — BUPROPION HCL SR 150 MG TABLET,12 HR SUSTAINED-RELEASE
ORAL_TABLET | Freq: Two times a day (BID) | ORAL | 3 refills | 90 days | Status: CP
Start: 2023-07-17 — End: ?

## 2023-07-24 MED ORDER — CLONAZEPAM 0.5 MG TABLET
ORAL_TABLET | Freq: Three times a day (TID) | ORAL | 5 refills | 40 days | Status: CP
Start: 2023-07-24 — End: ?

## 2023-07-25 ENCOUNTER — Emergency Department: Admit: 2023-07-25 | Discharge: 2023-07-25 | Disposition: A | Discharge status: Home / Self Care | Payer: MEDICARE

## 2023-07-25 ENCOUNTER — Ambulatory Visit: Admit: 2023-07-25 | Discharge: 2023-07-25 | Disposition: A | Discharge status: Home / Self Care | Payer: MEDICARE

## 2023-07-25 DIAGNOSIS — M898X1 Other specified disorders of bone, shoulder: Principal | ICD-10-CM

## 2023-07-27 ENCOUNTER — Ambulatory Visit: Admit: 2023-07-27 | Discharge: 2023-07-28 | Discharge status: Against Medical Advice | Payer: MEDICARE

## 2023-07-27 ENCOUNTER — Ambulatory Visit: Admit: 2023-07-27 | Discharge: 2023-07-27 | Discharge status: Against Medical Advice | Payer: MEDICARE

## 2023-07-27 DIAGNOSIS — M25511 Pain in right shoulder: Principal | ICD-10-CM

## 2023-07-27 DIAGNOSIS — W19XXXS Unspecified fall, sequela: Principal | ICD-10-CM

## 2023-07-27 DIAGNOSIS — M533 Sacrococcygeal disorders, not elsewhere classified: Principal | ICD-10-CM

## 2023-07-27 DIAGNOSIS — Z09 Encounter for follow-up examination after completed treatment for conditions other than malignant neoplasm: Principal | ICD-10-CM

## 2023-07-27 DIAGNOSIS — M545 Acute right-sided low back pain without sciatica: Principal | ICD-10-CM

## 2023-07-27 MED ORDER — TRAMADOL 50 MG TABLET
ORAL_TABLET | Freq: Four times a day (QID) | ORAL | 0 refills | 5 days | Status: CP | PRN
Start: 2023-07-27 — End: ?

## 2023-07-27 MED ORDER — METHYLPREDNISOLONE 4 MG TABLETS IN A DOSE PACK
0 refills | 0 days | Status: CP
Start: 2023-07-27 — End: ?

## 2023-07-28 MED ORDER — METHYLPREDNISOLONE 4 MG TABLETS IN A DOSE PACK
0 refills | 0 days | Status: CP
Start: 2023-07-28 — End: ?

## 2023-07-30 DIAGNOSIS — M549 Dorsalgia, unspecified: Principal | ICD-10-CM

## 2023-07-30 MED ORDER — CLONAZEPAM 0.5 MG TABLET
ORAL_TABLET | Freq: Three times a day (TID) | ORAL | 5 refills | 40 days | Status: CP
Start: 2023-07-30 — End: ?

## 2023-08-06 ENCOUNTER — Ambulatory Visit
Admit: 2023-08-06 | Discharge: 2023-08-07 | Payer: MEDICARE | Attending: Student in an Organized Health Care Education/Training Program | Primary: Student in an Organized Health Care Education/Training Program

## 2023-08-06 DIAGNOSIS — G8929 Other chronic pain: Principal | ICD-10-CM

## 2023-08-06 DIAGNOSIS — M5417 Radiculopathy, lumbosacral region: Principal | ICD-10-CM

## 2023-08-06 DIAGNOSIS — M533 Sacrococcygeal disorders, not elsewhere classified: Principal | ICD-10-CM

## 2023-08-06 DIAGNOSIS — M47812 Spondylosis without myelopathy or radiculopathy, cervical region: Principal | ICD-10-CM

## 2023-08-06 DIAGNOSIS — G35 Multiple sclerosis: Principal | ICD-10-CM

## 2023-08-06 DIAGNOSIS — M549 Dorsalgia, unspecified: Principal | ICD-10-CM

## 2023-08-24 MED ORDER — CLONAZEPAM 0.5 MG TABLET
ORAL_TABLET | Freq: Four times a day (QID) | ORAL | 0 refills | 30 days | Status: CP
Start: 2023-08-24 — End: ?

## 2023-08-27 ENCOUNTER — Ambulatory Visit: Admit: 2023-08-27 | Payer: MEDICARE

## 2023-08-28 ENCOUNTER — Ambulatory Visit: Admit: 2023-08-28 | Discharge: 2023-08-29 | Payer: MEDICARE

## 2023-08-28 DIAGNOSIS — L658 Other specified nonscarring hair loss: Principal | ICD-10-CM

## 2023-08-28 DIAGNOSIS — L821 Other seborrheic keratosis: Principal | ICD-10-CM

## 2023-08-28 DIAGNOSIS — L650 Telogen effluvium: Principal | ICD-10-CM

## 2023-08-28 DIAGNOSIS — L570 Actinic keratosis: Principal | ICD-10-CM

## 2023-08-28 DIAGNOSIS — D1801 Hemangioma of skin and subcutaneous tissue: Principal | ICD-10-CM

## 2023-08-28 DIAGNOSIS — M5416 Radiculopathy, lumbar region: Principal | ICD-10-CM

## 2023-08-28 DIAGNOSIS — Z85828 Personal history of other malignant neoplasm of skin: Principal | ICD-10-CM

## 2023-08-28 DIAGNOSIS — D229 Melanocytic nevi, unspecified: Principal | ICD-10-CM

## 2023-08-28 DIAGNOSIS — D692 Other nonthrombocytopenic purpura: Principal | ICD-10-CM

## 2023-08-28 DIAGNOSIS — L814 Other melanin hyperpigmentation: Principal | ICD-10-CM

## 2023-09-05 ENCOUNTER — Ambulatory Visit: Admit: 2023-09-05 | Discharge: 2023-09-06 | Payer: MEDICARE

## 2023-09-05 DIAGNOSIS — H53483 Generalized contraction of visual field, bilateral: Principal | ICD-10-CM

## 2023-09-05 DIAGNOSIS — G35 Multiple sclerosis: Principal | ICD-10-CM

## 2023-09-05 DIAGNOSIS — H539 Unspecified visual disturbance: Principal | ICD-10-CM

## 2023-09-18 ENCOUNTER — Ambulatory Visit
Admit: 2023-09-18 | Discharge: 2023-09-19 | Payer: MEDICARE | Attending: Student in an Organized Health Care Education/Training Program | Primary: Student in an Organized Health Care Education/Training Program

## 2023-09-18 ENCOUNTER — Ambulatory Visit: Admit: 2023-09-18 | Discharge: 2023-09-19 | Payer: MEDICARE

## 2023-09-18 DIAGNOSIS — F3342 Major depressive disorder, recurrent, in full remission: Principal | ICD-10-CM

## 2023-09-18 DIAGNOSIS — Z0000 Encounter for general adult medical examination without abnormal findings: Principal | ICD-10-CM

## 2023-09-18 DIAGNOSIS — G35 Multiple sclerosis: Principal | ICD-10-CM

## 2023-09-18 DIAGNOSIS — M898X1 Other specified disorders of bone, shoulder: Principal | ICD-10-CM

## 2023-09-18 DIAGNOSIS — E785 Hyperlipidemia, unspecified: Principal | ICD-10-CM

## 2023-09-18 DIAGNOSIS — M4802 Spinal stenosis, cervical region: Principal | ICD-10-CM

## 2023-09-18 DIAGNOSIS — R32 Unspecified urinary incontinence: Principal | ICD-10-CM

## 2023-09-18 DIAGNOSIS — I10 Essential (primary) hypertension: Principal | ICD-10-CM

## 2023-09-18 DIAGNOSIS — M25511 Pain in right shoulder: Principal | ICD-10-CM

## 2023-09-20 ENCOUNTER — Ambulatory Visit: Admit: 2023-09-20 | Discharge: 2023-09-21 | Payer: MEDICARE | Attending: Anesthesiology | Primary: Anesthesiology

## 2023-09-20 DIAGNOSIS — M48061 Spinal stenosis, lumbar region without neurogenic claudication: Principal | ICD-10-CM

## 2023-09-20 DIAGNOSIS — M533 Sacrococcygeal disorders, not elsewhere classified: Principal | ICD-10-CM

## 2023-09-20 DIAGNOSIS — M47812 Spondylosis without myelopathy or radiculopathy, cervical region: Principal | ICD-10-CM

## 2023-09-20 DIAGNOSIS — M5416 Radiculopathy, lumbar region: Principal | ICD-10-CM

## 2023-09-22 MED ORDER — CLONAZEPAM 0.5 MG TABLET
ORAL_TABLET | Freq: Four times a day (QID) | ORAL | 0 refills | 0.00 days
Start: 2023-09-22 — End: ?

## 2023-09-24 MED ORDER — CLONAZEPAM 0.5 MG TABLET
ORAL_TABLET | Freq: Four times a day (QID) | ORAL | 0 refills | 30 days | Status: CP
Start: 2023-09-24 — End: ?

## 2023-10-02 DIAGNOSIS — M5416 Radiculopathy, lumbar region: Principal | ICD-10-CM

## 2023-10-04 ENCOUNTER — Inpatient Hospital Stay: Admit: 2023-10-04 | Discharge: 2023-10-04 | Payer: MEDICARE

## 2023-10-04 ENCOUNTER — Ambulatory Visit: Admit: 2023-10-04 | Discharge: 2023-10-04 | Payer: MEDICARE

## 2023-10-04 DIAGNOSIS — M5416 Radiculopathy, lumbar region: Principal | ICD-10-CM

## 2023-10-08 DIAGNOSIS — M792 Neuralgia and neuritis, unspecified: Principal | ICD-10-CM

## 2023-10-08 MED ORDER — GABAPENTIN 300 MG CAPSULE
ORAL_CAPSULE | Freq: Four times a day (QID) | ORAL | 1 refills | 68.00 days
Start: 2023-10-08 — End: ?

## 2023-10-15 DIAGNOSIS — I10 Essential (primary) hypertension: Principal | ICD-10-CM

## 2023-10-15 MED ORDER — TELMISARTAN 80 MG TABLET
ORAL_TABLET | Freq: Every day | ORAL | 3 refills | 90.00 days
Start: 2023-10-15 — End: ?

## 2023-10-19 ENCOUNTER — Ambulatory Visit: Admit: 2023-10-19 | Discharge: 2023-10-20 | Payer: MEDICARE

## 2023-10-19 MED ORDER — CLONAZEPAM 0.5 MG TABLET
ORAL_TABLET | Freq: Four times a day (QID) | ORAL | 0 refills | 30 days | Status: CP
Start: 2023-10-19 — End: ?

## 2023-10-19 MED ORDER — BUPROPION HCL SR 150 MG TABLET,12 HR SUSTAINED-RELEASE
ORAL_TABLET | Freq: Two times a day (BID) | ORAL | 3 refills | 90.00 days | Status: CP
Start: 2023-10-19 — End: ?

## 2023-10-21 MED ORDER — CLONAZEPAM 0.5 MG TABLET
ORAL_TABLET | Freq: Four times a day (QID) | ORAL | 0 refills | 0.00 days
Start: 2023-10-21 — End: ?

## 2023-10-22 MED ORDER — CLONAZEPAM 0.5 MG TABLET
ORAL_TABLET | Freq: Four times a day (QID) | ORAL | 0 refills | 30 days | Status: CP
Start: 2023-10-22 — End: ?

## 2023-10-23 ENCOUNTER — Ambulatory Visit: Admit: 2023-10-23 | Discharge: 2023-10-24 | Payer: MEDICARE | Attending: Anesthesiology | Primary: Anesthesiology

## 2023-10-23 ENCOUNTER — Ambulatory Visit: Admit: 2023-10-23 | Discharge: 2023-10-24 | Payer: MEDICARE

## 2023-10-29 ENCOUNTER — Ambulatory Visit: Admit: 2023-10-29 | Discharge: 2023-10-30 | Payer: MEDICARE | Attending: Anesthesiology | Primary: Anesthesiology

## 2023-10-29 DIAGNOSIS — M5416 Radiculopathy, lumbar region: Principal | ICD-10-CM

## 2023-11-04 DIAGNOSIS — G35 Multiple sclerosis: Principal | ICD-10-CM

## 2023-11-04 MED ORDER — BACLOFEN 20 MG TABLET
ORAL_TABLET | Freq: Three times a day (TID) | ORAL | 5 refills | 30.00 days
Start: 2023-11-04 — End: ?

## 2023-11-07 DIAGNOSIS — G35 Multiple sclerosis: Principal | ICD-10-CM

## 2023-11-07 MED ORDER — BACLOFEN 20 MG TABLET
ORAL_TABLET | Freq: Three times a day (TID) | ORAL | 5 refills | 30.00 days
Start: 2023-11-07 — End: ?

## 2023-11-08 ENCOUNTER — Inpatient Hospital Stay: Admit: 2023-11-08 | Discharge: 2023-11-09 | Payer: MEDICARE

## 2023-11-21 MED ORDER — TELMISARTAN 80 MG TABLET
ORAL_TABLET | Freq: Every day | ORAL | 1 refills | 90 days | Status: CP
Start: 2023-11-21 — End: ?

## 2023-11-26 ENCOUNTER — Ambulatory Visit: Admit: 2023-11-26 | Discharge: 2023-11-27

## 2023-11-27 ENCOUNTER — Ambulatory Visit: Admit: 2023-11-27 | Discharge: 2023-11-28

## 2023-11-27 DIAGNOSIS — M5416 Radiculopathy, lumbar region: Principal | ICD-10-CM

## 2023-12-18 DIAGNOSIS — M792 Neuralgia and neuritis, unspecified: Principal | ICD-10-CM

## 2023-12-18 MED ORDER — GABAPENTIN 300 MG CAPSULE
ORAL_CAPSULE | Freq: Four times a day (QID) | ORAL | 1 refills | 0.00000 days
Start: 2023-12-18 — End: ?

## 2023-12-19 MED ORDER — GABAPENTIN 300 MG CAPSULE
ORAL_CAPSULE | Freq: Four times a day (QID) | ORAL | 1 refills | 68.00000 days | Status: CP
Start: 2023-12-19 — End: ?

## 2023-12-22 MED ORDER — CLONAZEPAM 0.5 MG TABLET
ORAL_TABLET | Freq: Four times a day (QID) | ORAL | 0 refills | 0.00000 days
Start: 2023-12-22 — End: ?

## 2023-12-24 MED ORDER — CLONAZEPAM 0.5 MG TABLET
ORAL_TABLET | Freq: Four times a day (QID) | ORAL | 0 refills | 30.00000 days | Status: CP
Start: 2023-12-24 — End: ?

## 2024-01-10 ENCOUNTER — Ambulatory Visit: Admit: 2024-01-10 | Discharge: 2024-01-11 | Payer: MEDICARE

## 2024-01-17 ENCOUNTER — Ambulatory Visit: Admit: 2024-01-17 | Discharge: 2024-01-18 | Payer: MEDICARE | Attending: Anesthesiology | Primary: Anesthesiology

## 2024-01-17 DIAGNOSIS — M48061 Spinal stenosis, lumbar region without neurogenic claudication: Principal | ICD-10-CM

## 2024-01-17 DIAGNOSIS — M5416 Radiculopathy, lumbar region: Principal | ICD-10-CM

## 2024-01-17 DIAGNOSIS — M47812 Spondylosis without myelopathy or radiculopathy, cervical region: Principal | ICD-10-CM

## 2024-01-17 DIAGNOSIS — M5412 Radiculopathy, cervical region: Principal | ICD-10-CM

## 2024-01-22 MED ORDER — CLONAZEPAM 0.5 MG TABLET
ORAL_TABLET | Freq: Four times a day (QID) | ORAL | 0 refills | 30.00000 days | Status: CP
Start: 2024-01-22 — End: ?

## 2024-02-04 DIAGNOSIS — H5111 Convergence insufficiency: Principal | ICD-10-CM

## 2024-02-04 DIAGNOSIS — G35 Multiple sclerosis: Principal | ICD-10-CM

## 2024-02-07 DIAGNOSIS — F3341 Major depressive disorder, recurrent, in partial remission: Principal | ICD-10-CM

## 2024-02-07 MED ORDER — CLONAZEPAM 0.5 MG TABLET
ORAL_TABLET | Freq: Four times a day (QID) | ORAL | 3 refills | 30.00000 days | Status: CP
Start: 2024-02-07 — End: ?

## 2024-02-07 MED ORDER — BUPROPION HCL SR 150 MG TABLET,12 HR SUSTAINED-RELEASE
ORAL_TABLET | Freq: Two times a day (BID) | ORAL | 3 refills | 90.00000 days | Status: CP
Start: 2024-02-07 — End: 2024-02-07

## 2024-02-13 DIAGNOSIS — M19011 Primary osteoarthritis, right shoulder: Principal | ICD-10-CM

## 2024-02-13 DIAGNOSIS — L905 Scar conditions and fibrosis of skin: Principal | ICD-10-CM

## 2024-02-13 DIAGNOSIS — M19012 Primary osteoarthritis, left shoulder: Principal | ICD-10-CM

## 2024-02-14 ENCOUNTER — Ambulatory Visit: Admit: 2024-02-14 | Discharge: 2024-02-15 | Payer: MEDICARE

## 2024-02-14 DIAGNOSIS — R339 Retention of urine, unspecified: Principal | ICD-10-CM

## 2024-02-14 DIAGNOSIS — R109 Unspecified abdominal pain: Principal | ICD-10-CM

## 2024-02-14 DIAGNOSIS — R32 Unspecified urinary incontinence: Principal | ICD-10-CM

## 2024-02-14 DIAGNOSIS — R102 Pelvic and perineal pain: Principal | ICD-10-CM

## 2024-02-18 DIAGNOSIS — R32 Unspecified urinary incontinence: Principal | ICD-10-CM

## 2024-02-21 ENCOUNTER — Ambulatory Visit: Admit: 2024-02-21 | Discharge: 2024-02-22 | Payer: MEDICARE | Attending: Urology | Primary: Urology

## 2024-02-21 DIAGNOSIS — R1900 Intra-abdominal and pelvic swelling, mass and lump, unspecified site: Principal | ICD-10-CM

## 2024-02-21 DIAGNOSIS — R399 Unspecified symptoms and signs involving the genitourinary system: Principal | ICD-10-CM

## 2024-02-25 ENCOUNTER — Encounter: Admit: 2024-02-25 | Discharge: 2024-02-25 | Payer: MEDICARE | Attending: Urology | Primary: Urology

## 2024-02-25 ENCOUNTER — Inpatient Hospital Stay: Admit: 2024-02-25 | Discharge: 2024-02-25 | Payer: MEDICARE

## 2024-02-25 ENCOUNTER — Ambulatory Visit: Admit: 2024-02-25 | Discharge: 2024-02-25 | Payer: MEDICARE

## 2024-02-25 DIAGNOSIS — R300 Dysuria: Principal | ICD-10-CM

## 2024-02-25 MED ORDER — CIPROFLOXACIN 500 MG TABLET
ORAL_TABLET | Freq: Two times a day (BID) | ORAL | 0 refills | 7.00000 days | Status: CP
Start: 2024-02-25 — End: 2024-03-03

## 2024-02-29 MED ORDER — BUPROPION HCL SR 200 MG TABLET,12 HR SUSTAINED-RELEASE
ORAL_TABLET | Freq: Two times a day (BID) | ORAL | 0 refills | 30.00000 days | Status: CP
Start: 2024-02-29 — End: ?

## 2024-03-06 MED ORDER — BUPROPION HCL SR 150 MG TABLET,12 HR SUSTAINED-RELEASE
ORAL_TABLET | Freq: Two times a day (BID) | ORAL | 3 refills | 30.00000 days | Status: CP
Start: 2024-03-06 — End: 2024-03-06

## 2024-03-07 MED ORDER — BUPROPION HCL SR 150 MG TABLET,12 HR SUSTAINED-RELEASE
ORAL_TABLET | Freq: Two times a day (BID) | ORAL | 3 refills | 30.00000 days | Status: CP
Start: 2024-03-07 — End: ?

## 2024-03-10 ENCOUNTER — Inpatient Hospital Stay: Admit: 2024-03-10 | Discharge: 2024-03-10 | Payer: MEDICARE

## 2024-03-10 DIAGNOSIS — M5416 Radiculopathy, lumbar region: Principal | ICD-10-CM

## 2024-03-13 ENCOUNTER — Ambulatory Visit: Admit: 2024-03-13 | Discharge: 2024-03-14 | Payer: MEDICARE

## 2024-03-17 DIAGNOSIS — G35 Multiple sclerosis: Principal | ICD-10-CM

## 2024-03-17 MED ORDER — BACLOFEN 20 MG TABLET
ORAL_TABLET | Freq: Three times a day (TID) | ORAL | 5 refills | 30.00000 days
Start: 2024-03-17 — End: ?

## 2024-03-19 MED ORDER — BACLOFEN 20 MG TABLET
ORAL_TABLET | Freq: Three times a day (TID) | ORAL | 5 refills | 30.00000 days | Status: CP
Start: 2024-03-19 — End: ?

## 2024-03-20 MED ORDER — ALENDRONATE 70 MG TABLET
ORAL_TABLET | ORAL | 3 refills | 84.00000 days
Start: 2024-03-20 — End: 2025-03-20

## 2024-03-20 MED ORDER — GABAPENTIN 300 MG CAPSULE
ORAL_CAPSULE | Freq: Four times a day (QID) | ORAL | 1 refills | 68.00000 days
Start: 2024-03-20 — End: ?

## 2024-03-20 MED ORDER — AMLODIPINE 5 MG TABLET
ORAL_TABLET | ORAL | 3 refills | 0.00000 days
Start: 2024-03-20 — End: ?

## 2024-03-20 MED ORDER — OMEPRAZOLE 20 MG CAPSULE,DELAYED RELEASE
ORAL_CAPSULE | Freq: Every day | ORAL | 3 refills | 90.00000 days
Start: 2024-03-20 — End: 2025-03-20

## 2024-03-20 MED ORDER — PRAVASTATIN 40 MG TABLET
ORAL_TABLET | Freq: Every evening | ORAL | 3 refills | 90.00000 days
Start: 2024-03-20 — End: ?

## 2024-03-22 DIAGNOSIS — G35 Multiple sclerosis: Principal | ICD-10-CM

## 2024-03-22 MED ORDER — BACLOFEN 20 MG TABLET
ORAL_TABLET | Freq: Three times a day (TID) | ORAL | 5 refills | 30.00000 days | Status: CP
Start: 2024-03-22 — End: ?

## 2024-03-24 ENCOUNTER — Encounter
Admit: 2024-03-24 | Discharge: 2024-03-24 | Payer: MEDICARE | Attending: Student in an Organized Health Care Education/Training Program | Primary: Student in an Organized Health Care Education/Training Program

## 2024-03-24 ENCOUNTER — Inpatient Hospital Stay: Admit: 2024-03-24 | Discharge: 2024-03-24 | Payer: MEDICARE

## 2024-03-24 DIAGNOSIS — R233 Spontaneous ecchymoses: Principal | ICD-10-CM

## 2024-03-24 DIAGNOSIS — M79645 Pain in left finger(s): Principal | ICD-10-CM

## 2024-03-24 DIAGNOSIS — R140 Abdominal distension (gaseous): Principal | ICD-10-CM

## 2024-03-24 DIAGNOSIS — I10 Essential (primary) hypertension: Principal | ICD-10-CM

## 2024-03-24 DIAGNOSIS — M792 Neuralgia and neuritis, unspecified: Principal | ICD-10-CM

## 2024-03-24 DIAGNOSIS — E785 Hyperlipidemia, unspecified: Principal | ICD-10-CM

## 2024-03-24 DIAGNOSIS — M810 Age-related osteoporosis without current pathological fracture: Principal | ICD-10-CM

## 2024-03-24 MED ORDER — AMLODIPINE 5 MG TABLET
ORAL_TABLET | Freq: Two times a day (BID) | ORAL | 3 refills | 90.00000 days | Status: CP
Start: 2024-03-24 — End: ?

## 2024-03-24 MED ORDER — PRAVASTATIN 40 MG TABLET
ORAL_TABLET | Freq: Every evening | ORAL | 3 refills | 90.00000 days | Status: CP
Start: 2024-03-24 — End: ?

## 2024-03-24 MED ORDER — OMEPRAZOLE 20 MG CAPSULE,DELAYED RELEASE
ORAL_CAPSULE | Freq: Every day | ORAL | 3 refills | 90.00000 days | Status: CP
Start: 2024-03-24 — End: 2025-03-24

## 2024-03-24 MED ORDER — ALENDRONATE 70 MG TABLET
ORAL_TABLET | ORAL | 3 refills | 84.00000 days | Status: CP
Start: 2024-03-24 — End: 2025-03-24

## 2024-03-25 ENCOUNTER — Ambulatory Visit: Admit: 2024-03-25 | Discharge: 2024-03-26 | Payer: MEDICARE | Attending: Anesthesiology | Primary: Anesthesiology

## 2024-03-25 DIAGNOSIS — M47812 Spondylosis without myelopathy or radiculopathy, cervical region: Principal | ICD-10-CM

## 2024-04-02 DIAGNOSIS — R140 Abdominal distension (gaseous): Principal | ICD-10-CM

## 2024-04-02 DIAGNOSIS — T859XXA Unspecified complication of internal prosthetic device, implant and graft, initial encounter: Principal | ICD-10-CM

## 2024-04-02 MED ORDER — HYOSCYAMINE 0.125 MG SUBLINGUAL TABLET
ORAL_TABLET | Freq: Four times a day (QID) | SUBLINGUAL | 0 refills | 8.00000 days | Status: CP | PRN
Start: 2024-04-02 — End: ?

## 2024-04-07 ENCOUNTER — Ambulatory Visit: Admit: 2024-04-07 | Discharge: 2024-04-08 | Payer: MEDICARE | Attending: Anesthesiology | Primary: Anesthesiology

## 2024-04-07 DIAGNOSIS — M5416 Radiculopathy, lumbar region: Principal | ICD-10-CM

## 2024-04-07 DIAGNOSIS — M47812 Spondylosis without myelopathy or radiculopathy, cervical region: Principal | ICD-10-CM

## 2024-04-11 DIAGNOSIS — I10 Essential (primary) hypertension: Principal | ICD-10-CM

## 2024-04-11 MED ORDER — TELMISARTAN 80 MG TABLET
ORAL_TABLET | Freq: Every day | ORAL | 3 refills | 90.00000 days | Status: CP
Start: 2024-04-11 — End: ?

## 2024-04-14 ENCOUNTER — Ambulatory Visit
Admit: 2024-04-14 | Discharge: 2024-04-15 | Payer: MEDICARE | Attending: Orthopaedic Surgery | Primary: Orthopaedic Surgery

## 2024-04-14 DIAGNOSIS — R198 Other specified symptoms and signs involving the digestive system and abdomen: Principal | ICD-10-CM

## 2024-05-06 DIAGNOSIS — F411 Generalized anxiety disorder: Principal | ICD-10-CM

## 2024-05-06 DIAGNOSIS — F3341 Major depressive disorder, recurrent, in partial remission: Principal | ICD-10-CM

## 2024-05-08 ENCOUNTER — Ambulatory Visit: Admit: 2024-05-08 | Discharge: 2024-05-09 | Payer: MEDICARE | Attending: Anesthesiology | Primary: Anesthesiology

## 2024-05-08 DIAGNOSIS — M47812 Spondylosis without myelopathy or radiculopathy, cervical region: Principal | ICD-10-CM

## 2024-05-08 DIAGNOSIS — M5416 Radiculopathy, lumbar region: Principal | ICD-10-CM

## 2024-05-09 DIAGNOSIS — M47812 Spondylosis without myelopathy or radiculopathy, cervical region: Principal | ICD-10-CM

## 2024-05-12 DIAGNOSIS — G4761 Periodic limb movement disorder: Principal | ICD-10-CM

## 2024-05-12 DIAGNOSIS — G35 Multiple sclerosis: Principal | ICD-10-CM

## 2024-05-16 DIAGNOSIS — T50905A Adverse effect of unspecified drugs, medicaments and biological substances, initial encounter: Principal | ICD-10-CM

## 2024-05-16 DIAGNOSIS — G4733 Obstructive sleep apnea (adult) (pediatric): Principal | ICD-10-CM

## 2024-05-16 DIAGNOSIS — G4750 Parasomnia, unspecified: Principal | ICD-10-CM

## 2024-05-16 DIAGNOSIS — G4769 Other sleep related movement disorders: Principal | ICD-10-CM

## 2024-05-19 ENCOUNTER — Ambulatory Visit: Admit: 2024-05-19 | Discharge: 2024-05-20 | Payer: MEDICARE

## 2024-05-19 DIAGNOSIS — R498 Other voice and resonance disorders: Principal | ICD-10-CM

## 2024-05-19 DIAGNOSIS — R1310 Dysphagia, unspecified: Principal | ICD-10-CM

## 2024-05-19 DIAGNOSIS — J383 Other diseases of vocal cords: Principal | ICD-10-CM

## 2024-05-19 DIAGNOSIS — R490 Dysphonia: Principal | ICD-10-CM

## 2024-05-19 MED ORDER — AZELASTINE 137 MCG (0.1 %) NASAL SPRAY
Freq: Every morning | NASAL | 2 refills | 109.00000 days | Status: CP
Start: 2024-05-19 — End: 2025-05-19

## 2024-05-20 MED ORDER — AZELASTINE 137 MCG (0.1 %) NASAL SPRAY
Freq: Every morning | NASAL | 2 refills | 109.00000 days | Status: CP
Start: 2024-05-20 — End: 2025-05-20

## 2024-05-22 ENCOUNTER — Ambulatory Visit: Admit: 2024-05-22 | Discharge: 2024-05-23 | Payer: MEDICARE

## 2024-05-22 ENCOUNTER — Inpatient Hospital Stay: Admit: 2024-05-22 | Discharge: 2024-05-23 | Payer: MEDICARE

## 2024-05-22 DIAGNOSIS — M25562 Pain in left knee: Principal | ICD-10-CM

## 2024-05-22 DIAGNOSIS — G8929 Other chronic pain: Principal | ICD-10-CM

## 2024-05-22 DIAGNOSIS — M1712 Unilateral primary osteoarthritis, left knee: Principal | ICD-10-CM

## 2024-05-28 ENCOUNTER — Ambulatory Visit: Admit: 2024-05-28 | Discharge: 2024-05-29 | Payer: MEDICARE | Attending: Anesthesiology | Primary: Anesthesiology

## 2024-05-28 DIAGNOSIS — M47812 Spondylosis without myelopathy or radiculopathy, cervical region: Principal | ICD-10-CM

## 2024-05-28 DIAGNOSIS — M5416 Radiculopathy, lumbar region: Principal | ICD-10-CM

## 2024-06-02 ENCOUNTER — Encounter: Admit: 2024-06-02 | Discharge: 2024-06-02 | Payer: MEDICARE | Attending: Neurology | Primary: Neurology

## 2024-06-02 DIAGNOSIS — G35D Multiple sclerosis: Principal | ICD-10-CM

## 2024-06-02 DIAGNOSIS — N319 Neuromuscular dysfunction of bladder, unspecified: Principal | ICD-10-CM

## 2024-06-02 DIAGNOSIS — I10 Essential (primary) hypertension: Principal | ICD-10-CM

## 2024-06-02 DIAGNOSIS — K5909 Other constipation: Principal | ICD-10-CM

## 2024-06-02 DIAGNOSIS — M792 Neuralgia and neuritis, unspecified: Principal | ICD-10-CM

## 2024-06-02 DIAGNOSIS — E559 Vitamin D deficiency, unspecified: Principal | ICD-10-CM

## 2024-06-02 DIAGNOSIS — G35A Multiple sclerosis, relapsing-remitting: Principal | ICD-10-CM

## 2024-06-02 MED ORDER — GABAPENTIN 300 MG CAPSULE
ORAL_CAPSULE | Freq: Three times a day (TID) | ORAL | 1 refills | 90.00000 days | Status: CP
Start: 2024-06-02 — End: ?

## 2024-06-03 DIAGNOSIS — N319 Neuromuscular dysfunction of bladder, unspecified: Principal | ICD-10-CM

## 2024-06-03 DIAGNOSIS — K5909 Other constipation: Principal | ICD-10-CM

## 2024-06-03 DIAGNOSIS — G35D Multiple sclerosis: Principal | ICD-10-CM

## 2024-06-13 ENCOUNTER — Inpatient Hospital Stay: Admit: 2024-06-13 | Discharge: 2024-06-13 | Payer: MEDICARE

## 2024-06-17 ENCOUNTER — Ambulatory Visit: Admitting: Physical Therapy

## 2024-06-17 DIAGNOSIS — Z1211 Encounter for screening for malignant neoplasm of colon: Principal | ICD-10-CM

## 2024-06-21 MED ORDER — CLONAZEPAM 0.5 MG TABLET
ORAL_TABLET | Freq: Four times a day (QID) | ORAL | 0 refills | 0.00000 days
Start: 2024-06-21 — End: ?

## 2024-06-22 MED ORDER — CLONAZEPAM 0.5 MG TABLET
ORAL_TABLET | Freq: Four times a day (QID) | ORAL | 0 refills | 30.00000 days | Status: CP
Start: 2024-06-22 — End: ?

## 2024-06-24 DIAGNOSIS — K5909 Other constipation: Principal | ICD-10-CM

## 2024-07-01 ENCOUNTER — Ambulatory Visit: Admit: 2024-07-01 | Discharge: 2024-07-02 | Payer: MEDICARE | Attending: Anesthesiology | Primary: Anesthesiology

## 2024-07-01 DIAGNOSIS — M47812 Spondylosis without myelopathy or radiculopathy, cervical region: Principal | ICD-10-CM

## 2024-07-01 DIAGNOSIS — G894 Chronic pain syndrome: Principal | ICD-10-CM

## 2024-07-21 MED ORDER — CLONAZEPAM 0.5 MG TABLET
ORAL_TABLET | Freq: Four times a day (QID) | ORAL | 0 refills | 0.00000 days
Start: 2024-07-21 — End: ?

## 2024-07-22 MED ORDER — CLONAZEPAM 0.5 MG TABLET
ORAL_TABLET | Freq: Four times a day (QID) | ORAL | 0 refills | 30.00000 days | Status: CP
Start: 2024-07-22 — End: ?

## 2024-07-24 ENCOUNTER — Ambulatory Visit: Admitting: Physical Therapy

## 2024-07-24 ENCOUNTER — Ambulatory Visit: Admit: 2024-07-24 | Discharge: 2024-07-25 | Payer: MEDICARE

## 2024-07-24 DIAGNOSIS — M12811 Other specific arthropathies, not elsewhere classified, right shoulder: Principal | ICD-10-CM

## 2024-07-24 DIAGNOSIS — M25511 Pain in right shoulder: Principal | ICD-10-CM

## 2024-07-24 MED ORDER — PEG 3350-ELECTROLYTES 236 GRAM-22.74 GRAM-6.74 GRAM-5.86 GRAM SOLUTION
Freq: Once | ORAL | 0 refills | 1.00000 days | Status: CP
Start: 2024-07-24 — End: 2024-07-24

## 2024-07-24 MED ORDER — BISACODYL 5 MG TABLET,DELAYED RELEASE
ORAL_TABLET | ORAL | 0 refills | 0.00000 days | Status: CP
Start: 2024-07-24 — End: ?

## 2024-07-24 MED ORDER — POLYETHYLENE GLYCOL 3350 17 GRAM/DOSE ORAL POWDER
ORAL | 0 refills | 0.00000 days | Status: CP
Start: 2024-07-24 — End: ?

## 2024-07-31 ENCOUNTER — Ambulatory Visit: Admitting: Physical Therapy

## 2024-08-04 ENCOUNTER — Ambulatory Visit: Admit: 2024-08-04 | Discharge: 2024-08-05 | Payer: MEDICARE | Attending: Anesthesiology | Primary: Anesthesiology

## 2024-08-04 DIAGNOSIS — G894 Chronic pain syndrome: Principal | ICD-10-CM

## 2024-08-04 DIAGNOSIS — M47812 Spondylosis without myelopathy or radiculopathy, cervical region: Principal | ICD-10-CM

## 2024-08-04 DIAGNOSIS — M5416 Radiculopathy, lumbar region: Principal | ICD-10-CM

## 2024-08-04 DIAGNOSIS — M47816 Spondylosis without myelopathy or radiculopathy, lumbar region: Principal | ICD-10-CM

## 2024-08-04 DIAGNOSIS — M5412 Radiculopathy, cervical region: Principal | ICD-10-CM

## 2024-08-05 DIAGNOSIS — F411 Generalized anxiety disorder: Principal | ICD-10-CM

## 2024-08-05 DIAGNOSIS — F3341 Major depressive disorder, recurrent, in partial remission: Principal | ICD-10-CM

## 2024-08-05 MED ORDER — BUPROPION HCL SR 150 MG TABLET,12 HR SUSTAINED-RELEASE
ORAL_TABLET | Freq: Two times a day (BID) | ORAL | 3 refills | 30.00000 days | Status: CP
Start: 2024-08-05 — End: ?

## 2024-08-14 DIAGNOSIS — G35D Multiple sclerosis: Principal | ICD-10-CM

## 2024-08-14 MED ORDER — BACLOFEN 20 MG TABLET
ORAL_TABLET | Freq: Three times a day (TID) | ORAL | 3 refills | 0.00000 days
Start: 2024-08-14 — End: ?

## 2024-08-16 MED ORDER — BACLOFEN 20 MG TABLET
ORAL_TABLET | Freq: Three times a day (TID) | ORAL | 3 refills | 90.00000 days
Start: 2024-08-16 — End: ?

## 2024-08-22 MED ORDER — CLONAZEPAM 0.5 MG TABLET
ORAL_TABLET | Freq: Four times a day (QID) | ORAL | 0 refills | 30.00000 days | Status: CP
Start: 2024-08-22 — End: ?

## 2024-08-28 ENCOUNTER — Ambulatory Visit: Admitting: Physical Therapy

## 2024-09-04 ENCOUNTER — Ambulatory Visit: Admitting: Physical Therapy

## 2024-09-11 ENCOUNTER — Ambulatory Visit: Admitting: Physical Therapy

## 2024-09-11 ENCOUNTER — Ambulatory Visit: Admit: 2024-09-11 | Discharge: 2024-09-12 | Payer: BLUE CROSS/BLUE SHIELD

## 2024-09-18 ENCOUNTER — Ambulatory Visit: Admitting: Physical Therapy

## 2024-09-22 MED ORDER — CLONAZEPAM 0.5 MG TABLET
ORAL_TABLET | Freq: Four times a day (QID) | ORAL | 0 refills | 30.00000 days | Status: CP
Start: 2024-09-22 — End: ?

## 2024-09-25 ENCOUNTER — Ambulatory Visit: Admitting: Physical Therapy

## 2024-10-02 ENCOUNTER — Ambulatory Visit: Admitting: Physical Therapy

## 2024-10-09 ENCOUNTER — Ambulatory Visit: Admitting: Physical Therapy

## 2024-10-16 ENCOUNTER — Ambulatory Visit: Admitting: Physical Therapy
